# Patient Record
Sex: Female | Born: 1955
Health system: Southern US, Community
[De-identification: ages and names within clinical notes are randomized; demographics above are authoritative.]

## PROBLEM LIST (undated history)

## (undated) DIAGNOSIS — Z8669 Personal history of other diseases of the nervous system and sense organs: Secondary | ICD-10-CM

## (undated) DIAGNOSIS — E785 Hyperlipidemia, unspecified: Secondary | ICD-10-CM

## (undated) DIAGNOSIS — C4491 Basal cell carcinoma of skin, unspecified: Secondary | ICD-10-CM

## (undated) DIAGNOSIS — I10 Essential (primary) hypertension: Secondary | ICD-10-CM

## (undated) DIAGNOSIS — T8859XA Other complications of anesthesia, initial encounter: Secondary | ICD-10-CM

## (undated) HISTORY — DX: Basal cell carcinoma of skin, unspecified: C44.91

## (undated) HISTORY — DX: Essential (primary) hypertension: I10

## (undated) HISTORY — DX: Hyperlipidemia, unspecified: E78.5

## (undated) HISTORY — PX: WISDOM TOOTH EXTRACTION: SHX21

## (undated) HISTORY — PX: BASAL CELL CARCINOMA EXCISION: SHX1214

## (undated) HISTORY — DX: Personal history of other diseases of the nervous system and sense organs: Z86.69

## (undated) HISTORY — PX: OTHER SURGICAL HISTORY: SHX169

---

## 1998-10-18 ENCOUNTER — Other Ambulatory Visit: Admission: RE | Admit: 1998-10-18 | Discharge: 1998-10-18 | Payer: Self-pay | Admitting: *Deleted

## 1999-11-14 ENCOUNTER — Encounter: Payer: Self-pay | Admitting: Internal Medicine

## 1999-11-14 ENCOUNTER — Encounter: Admission: RE | Admit: 1999-11-14 | Discharge: 1999-11-14 | Payer: Self-pay | Admitting: Internal Medicine

## 1999-12-19 ENCOUNTER — Other Ambulatory Visit: Admission: RE | Admit: 1999-12-19 | Discharge: 1999-12-19 | Payer: Self-pay | Admitting: *Deleted

## 2001-01-18 ENCOUNTER — Other Ambulatory Visit: Admission: RE | Admit: 2001-01-18 | Discharge: 2001-01-18 | Payer: Self-pay | Admitting: *Deleted

## 2002-04-14 ENCOUNTER — Other Ambulatory Visit: Admission: RE | Admit: 2002-04-14 | Discharge: 2002-04-14 | Payer: Self-pay | Admitting: Obstetrics & Gynecology

## 2003-06-29 ENCOUNTER — Other Ambulatory Visit: Admission: RE | Admit: 2003-06-29 | Discharge: 2003-06-29 | Payer: Self-pay | Admitting: Obstetrics & Gynecology

## 2005-09-25 ENCOUNTER — Ambulatory Visit: Payer: Self-pay | Admitting: Internal Medicine

## 2006-11-08 ENCOUNTER — Encounter (INDEPENDENT_AMBULATORY_CARE_PROVIDER_SITE_OTHER): Payer: Self-pay | Admitting: Specialist

## 2006-11-08 ENCOUNTER — Ambulatory Visit (HOSPITAL_COMMUNITY): Admission: RE | Admit: 2006-11-08 | Discharge: 2006-11-08 | Payer: Self-pay | Admitting: Obstetrics & Gynecology

## 2007-07-29 ENCOUNTER — Ambulatory Visit: Payer: Self-pay | Admitting: Internal Medicine

## 2007-07-29 DIAGNOSIS — E782 Mixed hyperlipidemia: Secondary | ICD-10-CM | POA: Insufficient documentation

## 2007-08-02 ENCOUNTER — Encounter: Payer: Self-pay | Admitting: Internal Medicine

## 2007-08-03 ENCOUNTER — Encounter (INDEPENDENT_AMBULATORY_CARE_PROVIDER_SITE_OTHER): Payer: Self-pay | Admitting: *Deleted

## 2007-08-03 LAB — CONVERTED CEMR LAB
ALT: 12 units/L (ref 0–35)
AST: 17 units/L (ref 0–37)
Albumin: 3.1 g/dL — ABNORMAL LOW (ref 3.5–5.2)
Alkaline Phosphatase: 78 units/L (ref 39–117)
BUN: 7 mg/dL (ref 6–23)
Basophils Absolute: 0.1 10*3/uL (ref 0.0–0.1)
Basophils Relative: 0.9 % (ref 0.0–1.0)
Bilirubin, Direct: 0.1 mg/dL (ref 0.0–0.3)
CO2: 24 meq/L (ref 19–32)
Calcium: 8.8 mg/dL (ref 8.4–10.5)
Chloride: 106 meq/L (ref 96–112)
Creatinine, Ser: 0.6 mg/dL (ref 0.4–1.2)
Eosinophils Absolute: 0.1 10*3/uL (ref 0.0–0.6)
Eosinophils Relative: 0.8 % (ref 0.0–5.0)
GFR calc Af Amer: 136 mL/min
GFR calc non Af Amer: 112 mL/min
Glucose, Bld: 84 mg/dL (ref 70–99)
HCT: 39.4 % (ref 36.0–46.0)
Hemoglobin: 13.5 g/dL (ref 12.0–15.0)
Lymphocytes Relative: 20.4 % (ref 12.0–46.0)
MCHC: 34.2 g/dL (ref 30.0–36.0)
MCV: 96 fL (ref 78.0–100.0)
Monocytes Absolute: 0.3 10*3/uL (ref 0.2–0.7)
Monocytes Relative: 4.4 % (ref 3.0–11.0)
Neutro Abs: 5.1 10*3/uL (ref 1.4–7.7)
Neutrophils Relative %: 73.5 % (ref 43.0–77.0)
Platelets: 328 10*3/uL (ref 150–400)
Potassium: 3.9 meq/L (ref 3.5–5.1)
RBC: 4.1 M/uL (ref 3.87–5.11)
RDW: 11.7 % (ref 11.5–14.6)
Sodium: 139 meq/L (ref 135–145)
TSH: 1.21 microintl units/mL (ref 0.35–5.50)
Total Bilirubin: 0.4 mg/dL (ref 0.3–1.2)
Total Protein: 6.9 g/dL (ref 6.0–8.3)
WBC: 7 10*3/uL (ref 4.5–10.5)

## 2007-09-02 ENCOUNTER — Encounter (INDEPENDENT_AMBULATORY_CARE_PROVIDER_SITE_OTHER): Payer: Self-pay | Admitting: *Deleted

## 2007-09-02 ENCOUNTER — Ambulatory Visit: Payer: Self-pay | Admitting: Internal Medicine

## 2008-09-08 ENCOUNTER — Ambulatory Visit: Payer: Self-pay | Admitting: Internal Medicine

## 2008-09-08 DIAGNOSIS — Z85828 Personal history of other malignant neoplasm of skin: Secondary | ICD-10-CM | POA: Insufficient documentation

## 2008-09-08 DIAGNOSIS — R519 Headache, unspecified: Secondary | ICD-10-CM | POA: Insufficient documentation

## 2008-09-08 DIAGNOSIS — R5383 Other fatigue: Secondary | ICD-10-CM

## 2008-09-08 DIAGNOSIS — R5381 Other malaise: Secondary | ICD-10-CM | POA: Insufficient documentation

## 2008-09-08 DIAGNOSIS — R51 Headache: Secondary | ICD-10-CM | POA: Insufficient documentation

## 2008-09-08 LAB — CONVERTED CEMR LAB
Cholesterol, target level: 200 mg/dL
HDL goal, serum: 40 mg/dL
LDL Goal: 160 mg/dL

## 2008-09-09 ENCOUNTER — Encounter: Payer: Self-pay | Admitting: Internal Medicine

## 2008-09-13 LAB — CONVERTED CEMR LAB
ALT: 19 units/L (ref 0–35)
AST: 23 units/L (ref 0–37)
Albumin: 3.2 g/dL — ABNORMAL LOW (ref 3.5–5.2)
Alkaline Phosphatase: 81 units/L (ref 39–117)
BUN: 7 mg/dL (ref 6–23)
Basophils Absolute: 0 10*3/uL (ref 0.0–0.1)
Basophils Relative: 0.4 % (ref 0.0–3.0)
Bilirubin, Direct: 0.1 mg/dL (ref 0.0–0.3)
CO2: 26 meq/L (ref 19–32)
Calcium: 8.6 mg/dL (ref 8.4–10.5)
Chloride: 106 meq/L (ref 96–112)
Creatinine, Ser: 0.6 mg/dL (ref 0.4–1.2)
Eosinophils Absolute: 0.1 10*3/uL (ref 0.0–0.7)
Eosinophils Relative: 1.7 % (ref 0.0–5.0)
Folate: 8 ng/mL
Free T4: 0.7 ng/dL (ref 0.6–1.6)
GFR calc Af Amer: 135 mL/min
GFR calc non Af Amer: 112 mL/min
Glucose, Bld: 105 mg/dL — ABNORMAL HIGH (ref 70–99)
HCT: 36.6 % (ref 36.0–46.0)
Hemoglobin: 12.4 g/dL (ref 12.0–15.0)
Lymphocytes Relative: 25.1 % (ref 12.0–46.0)
MCHC: 33.8 g/dL (ref 30.0–36.0)
MCV: 91.3 fL (ref 78.0–100.0)
Monocytes Absolute: 0.4 10*3/uL (ref 0.1–1.0)
Monocytes Relative: 6.3 % (ref 3.0–12.0)
Neutro Abs: 3.8 10*3/uL (ref 1.4–7.7)
Neutrophils Relative %: 66.5 % (ref 43.0–77.0)
Platelets: 321 10*3/uL (ref 150–400)
Potassium: 4 meq/L (ref 3.5–5.1)
RBC: 4.01 M/uL (ref 3.87–5.11)
RDW: 12.3 % (ref 11.5–14.6)
Sodium: 138 meq/L (ref 135–145)
TSH: 1.02 microintl units/mL (ref 0.35–5.50)
Total Bilirubin: 0.5 mg/dL (ref 0.3–1.2)
Total Protein: 7 g/dL (ref 6.0–8.3)
Vitamin B-12: 1441 pg/mL — ABNORMAL HIGH (ref 211–911)
WBC: 5.7 10*3/uL (ref 4.5–10.5)

## 2008-09-16 ENCOUNTER — Encounter (INDEPENDENT_AMBULATORY_CARE_PROVIDER_SITE_OTHER): Payer: Self-pay | Admitting: *Deleted

## 2008-09-30 ENCOUNTER — Ambulatory Visit: Payer: Self-pay | Admitting: Internal Medicine

## 2008-09-30 LAB — CONVERTED CEMR LAB
Cholesterol, target level: 200 mg/dL
HDL goal, serum: 50 mg/dL
LDL Goal: 130 mg/dL

## 2009-01-04 ENCOUNTER — Encounter: Payer: Self-pay | Admitting: Internal Medicine

## 2009-05-14 ENCOUNTER — Ambulatory Visit: Payer: Self-pay | Admitting: Internal Medicine

## 2009-05-18 ENCOUNTER — Encounter (INDEPENDENT_AMBULATORY_CARE_PROVIDER_SITE_OTHER): Payer: Self-pay | Admitting: *Deleted

## 2009-05-18 LAB — CONVERTED CEMR LAB
Cholesterol: 195 mg/dL (ref 0–200)
HDL: 74.1 mg/dL (ref >39.00)
Hgb A1c MFr Bld: 5 % (ref 4.6–6.5)
LDL Cholesterol: 109 mg/dL — ABNORMAL HIGH (ref 0–99)
Total CHOL/HDL Ratio: 3
Triglycerides: 59 mg/dL (ref 0.0–149.0)
VLDL: 11.8 mg/dL (ref 0.0–40.0)

## 2009-05-19 ENCOUNTER — Ambulatory Visit: Payer: Self-pay | Admitting: Internal Medicine

## 2009-05-19 DIAGNOSIS — R635 Abnormal weight gain: Secondary | ICD-10-CM | POA: Insufficient documentation

## 2009-09-17 ENCOUNTER — Encounter: Payer: Self-pay | Admitting: Internal Medicine

## 2010-04-05 ENCOUNTER — Encounter: Payer: Self-pay | Admitting: Internal Medicine

## 2010-04-05 ENCOUNTER — Ambulatory Visit: Payer: Self-pay | Admitting: Internal Medicine

## 2010-04-05 ENCOUNTER — Encounter (INDEPENDENT_AMBULATORY_CARE_PROVIDER_SITE_OTHER): Payer: Self-pay | Admitting: *Deleted

## 2010-04-05 DIAGNOSIS — F3289 Other specified depressive episodes: Secondary | ICD-10-CM | POA: Insufficient documentation

## 2010-04-05 DIAGNOSIS — I10 Essential (primary) hypertension: Secondary | ICD-10-CM | POA: Insufficient documentation

## 2010-04-05 DIAGNOSIS — F329 Major depressive disorder, single episode, unspecified: Secondary | ICD-10-CM | POA: Insufficient documentation

## 2010-04-05 DIAGNOSIS — M2550 Pain in unspecified joint: Secondary | ICD-10-CM | POA: Insufficient documentation

## 2010-04-08 LAB — CONVERTED CEMR LAB
ALT: 15 units/L (ref 0–35)
AST: 20 units/L (ref 0–37)
Albumin: 3.5 g/dL (ref 3.5–5.2)
Alkaline Phosphatase: 99 units/L (ref 39–117)
BUN: 13 mg/dL (ref 6–23)
Basophils Absolute: 0 10*3/uL (ref 0.0–0.1)
Basophils Relative: 0.3 % (ref 0.0–3.0)
Bilirubin, Direct: 0.1 mg/dL (ref 0.0–0.3)
CO2: 29 meq/L (ref 19–32)
Calcium: 9.1 mg/dL (ref 8.4–10.5)
Chloride: 105 meq/L (ref 96–112)
Cholesterol: 215 mg/dL — ABNORMAL HIGH (ref 0–200)
Creatinine, Ser: 0.8 mg/dL (ref 0.4–1.2)
Direct LDL: 124.8 mg/dL
Eosinophils Absolute: 0.2 10*3/uL (ref 0.0–0.7)
Eosinophils Relative: 3.8 % (ref 0.0–5.0)
GFR calc non Af Amer: 82.98 mL/min (ref >60)
Glucose, Bld: 89 mg/dL (ref 70–99)
HCT: 40.7 % (ref 36.0–46.0)
HDL: 81.7 mg/dL (ref >39.00)
Hemoglobin: 13.8 g/dL (ref 12.0–15.0)
Lymphocytes Relative: 31.9 % (ref 12.0–46.0)
Lymphs Abs: 1.6 10*3/uL (ref 0.7–4.0)
MCHC: 33.9 g/dL (ref 30.0–36.0)
MCV: 96.1 fL (ref 78.0–100.0)
Monocytes Absolute: 0.3 10*3/uL (ref 0.1–1.0)
Monocytes Relative: 6.6 % (ref 3.0–12.0)
Neutro Abs: 2.8 10*3/uL (ref 1.4–7.7)
Neutrophils Relative %: 57.4 % (ref 43.0–77.0)
Platelets: 270 10*3/uL (ref 150.0–400.0)
Potassium: 4.5 meq/L (ref 3.5–5.1)
RBC: 4.23 M/uL (ref 3.87–5.11)
RDW: 12.8 % (ref 11.5–14.6)
Rheumatoid fact SerPl-aCnc: <20 intl units/mL (ref 0–20)
Sed Rate: 27 mm/hr — ABNORMAL HIGH (ref 0–22)
Sodium: 142 meq/L (ref 135–145)
TSH: 1.31 microintl units/mL (ref 0.35–5.50)
Total Bilirubin: 0.5 mg/dL (ref 0.3–1.2)
Total CHOL/HDL Ratio: 3
Total Protein: 6.6 g/dL (ref 6.0–8.3)
Triglycerides: 80 mg/dL (ref 0.0–149.0)
VLDL: 16 mg/dL (ref 0.0–40.0)
WBC: 4.9 10*3/uL (ref 4.5–10.5)

## 2010-05-06 ENCOUNTER — Encounter (INDEPENDENT_AMBULATORY_CARE_PROVIDER_SITE_OTHER): Payer: Self-pay

## 2010-05-10 ENCOUNTER — Ambulatory Visit: Payer: Self-pay | Admitting: Internal Medicine

## 2010-05-24 ENCOUNTER — Ambulatory Visit: Payer: Self-pay | Admitting: Internal Medicine

## 2010-05-24 LAB — HM COLONOSCOPY: HM Colonoscopy: NORMAL

## 2010-08-21 HISTORY — PX: COLONOSCOPY: SHX174

## 2010-09-22 NOTE — Assessment & Plan Note (Signed)
Summary: cpx//tl   Vital Signs:  Patient Profile:   55 Years Old Female Weight:      165.50 pounds Pulse rate:   64 / minute Pulse rhythm:   regular BP sitting:   150 / 84  (left arm) Cuff size:   large  Vitals Entered By: Wendall Stade (July 29, 2007 10:11 AM)                 Chief Complaint:  cpx and Rash.  History of Present Illness:  She has areas on both lower legs intermittently, states her sister told her it was vasculitis. Slight edema with rash.  Rash      This is a 55 year old woman who presents with Rash.  At ankle bilat; no triggers or relievers.  The patient reports redness and increased warmth, but denies macules, papules, nodules, hives, welts, pustules, blisters, ulcers, itching, scaling, weeping, oozing, and tenderness.  The rash is located on the right leg and left leg.  The patient denies the following symptoms: fever, headache, facial swelling, tongue swelling, burning, difficulty breathing, abdominal pain, nausea, vomiting, diarrhea, dizziness, sore throat, dysuria, eye symptoms, arthralgias, and vaginal discharge.  The patient denies history of recent tick bite, recent tick exposure, other insect bite, recent infection, recent antibiotic use, new medication, new clothing, new topical exposure, recent travel, pet/animal contact, thyroid disease, chronic liver disease, autoimmune disease, chronic edema, and prior STD.    Current Allergies (reviewed today): No known allergies   Past Medical History:    Reviewed history and no changes required:       aortic bruit no AAA;PMH gallsstones  Past Surgical History:    Reviewed history and no changes required:       two pregnancies and two live births; [facility] 3/08   Family History:    Reviewed history and no changes required:       Father: unknown       Mother: ovarian cancer       brother cva age 68       sister ministrokes, heart disease        bro CABG @ 85        maternal grandmother bladder  ca  Social History:    No diet   Risk Factors:  Tobacco use:  never Alcohol use:  yes    Type:  occa Exercise:  no   Review of Systems  General      Complains of fatigue and sleep disorder.      Denies chills, fever, loss of appetite, malaise, sweats, weakness, and weight loss.      Irregular sleep issues  Eyes      Denies blurring, discharge, double vision, eye irritation, eye pain, halos, itching, light sensitivity, red eye, vision loss-1 eye, and vision loss-both eyes.      Last exam 2008 , ? early glaucoma  ENT      Denies decreased hearing, difficulty swallowing, ear discharge, earache, hoarseness, nasal congestion, nosebleeds, postnasal drainage, ringing in ears, sinus pressure, and sore throat.  CV      Denies bluish discoloration of lips or nails, chest pain or discomfort, difficulty breathing at night, difficulty breathing while lying down, leg cramps with exertion, palpitations, swelling of feet, and swelling of hands.      DOE with stairs  Resp      Denies cough, excessive snoring, hypersomnolence, shortness of breath, and sputum productive.  GI      Denies abdominal pain, bloody  stools, change in bowel habits, constipation, dark tarry stools, indigestion, nausea, and vomiting.  GU      Denies discharge, hematuria, nocturia, and urinary frequency.  MS      Denies joint pain, joint redness, joint swelling, loss of strength, low back pain, mid back pain, muscle aches, muscle , cramps, muscle weakness, stiffness, and thoracic pain.  Derm      See HPI      Complains of changes in color of skin and rash.      Denies changes in nail beds, dryness, excessive perspiration, flushing, hair loss, itching, lesion(s), and poor wound healing.      "white & blotchy" skin  Neuro      Denies difficulty with concentration, disturbances in coordination, memory loss, numbness, poor balance, sensation of room spinning, and tingling.      headaches pre menses  Psych       Complains of depression and easily tearful.      Denies anxiety, easily angered, and irritability.      Periodically "blue" related to menses  Endo      Denies cold intolerance, excessive hunger, excessive thirst, excessive urination, heat intolerance, polyuria, and weight change.  Heme      Denies abnormal bruising and bleeding.  Allergy      Complains of itching eyes and seasonal allergies.   Physical Exam  General:     Well-developed,well-nourished,in no acute distress; alert,appropriate and cooperative throughout examination Head:     Normocephalic and atraumatic without obvious abnormalities. No apparent alopecia or balding. Eyes:     No corneal or conjunctival inflammation noted.Marland Kitchen Perrla. Funduscopic exam benign, without hemorrhages, exudates or papilledema. Ears:     External ear exam shows no significant lesions or deformities.  Otoscopic examination reveals clear canals, tympanic membranes are intact bilaterally without bulging, retraction, inflammation or discharge. Hearing is grossly normal bilaterally. Nose:     External nasal examination shows no deformity or inflammation. Nasal mucosa are pink and moist without lesions or exudates. Mouth:     Oral mucosa and oropharynx without lesions or exudates.  Teeth in good repair. Neck:     No deformities, masses, or tenderness noted. Lungs:     Normal respiratory effort, chest expands symmetrically. Lungs are clear to auscultation, no crackles or wheezes. Heart:     Normal rate and regular rhythm. S1 and S2 normal without gallop, murmur, click, rub. S4 with slurring Abdomen:     Bowel sounds positive,abdomen soft and non-tender without masses, organomegaly or hernias noted. Msk:     No deformity or scoliosis noted of thoracic or lumbar spine.   Pulses:     R and L carotid,radial,dorsalis pedis and posterior tibial pulses are full and equal bilaterally Extremities:     No clubbing, cyanosis, edema, or deformity noted with  normal full range of motion of all joints.   Neurologic:     alert & oriented X3, strength normal in all extremities, gait normal, and DTRs symmetrical and normal.   Skin:     Faint brownish coloration @ ankles which disappear with pressure Cervical Nodes:     No lymphadenopathy noted Axillary Nodes:     No palpable lymphadenopathy Psych:     memory intact for recent and remote, normally interactive, good eye contact, not anxious appearing, and not depressed appearing.      Impression & Recommendations:  Problem # 1:  ROUTINE GENERAL MEDICAL EXAM@HEALTH  CARE FACL (ICD-V70.0)  Orders: TLB-BMP (Basic Metabolic Panel-BMET) (80048-METABOL) TLB-CBC Platelet -  w/Differential (85025-CBCD) TLB-Hepatic/Liver Function Pnl (80076-HEPATIC) TLB-TSH (Thyroid Stimulating Hormone) (84443-TSH) EKG w/ Interpretation (93000)   Problem # 2:  RASH-NONVESICULAR (ICD-782.1)  Orders: EKG w/ Interpretation (93000)   Problem # 3:  HYPERLIPIDEMIA (ICD-272.2)  Orders: T- * Misc. Laboratory test [phone]) EKG w/ Interpretation (93000)   Problem # 4:  ATHEROSCLEROTIC HEART DISEASE, FAMILY HX (ICD-V17.3)  Orders: T- * Misc. Laboratory test [phone]) EKG w/ Interpretation (93000)   Complete Medication List: 1)  Tri-sprintec Bcp  .... Once daily 2)  Xanax Using Husband's    Patient Instructions: 1)  Complete stool cards ; consider screening colonoscopy.    ]

## 2010-09-22 NOTE — Letter (Signed)
Summary: Primary Care Consult Scheduled Letter  [facility] [place]  [address] [place], [place] [address]   Phone: [phone]  Fax: [phone]      04/05/2010 MRN: [id number]  Megan Murphy [address] [place], [place]  [address]    Dear [name],    We have scheduled an appointment for you.  At the recommendation of Dr. Marga Melnick, we have scheduled you for a Screening Colonoscopy with Dr. Lina Sar of [facility].  Your initial Pre-Visit with a Nurse is on 05-10-2010 at 9:00am.  Your Colonoscopy is on 05-24-2010 arrive no later than 10:00am.  Their address is [address], [place] [address]. The office phone number is [phone].  If this appointment day and time is not convenient for you, please feel free to call the office of the doctor you are being referred to at the number listed above and reschedule the appointment.    It is important for you to keep your scheduled appointments. We are here to make sure you are given good patient care.   Thank you,    Renee, Patient Care Coordinator [place] [facility]

## 2010-09-22 NOTE — Letter (Signed)
Summary: Results Follow-up Letter  [facility]  [address] [place], [place] [address]   Phone: [phone]  Fax: [phone]    08/03/2007        Megan Murphy. Megan Murphy [address] [place], [place]  [address]  Dear Ms. Schoff,   The following are the results of your recent test(s):  Test     Result     Pap Smear    Normal_______  Not Normal_____       Comments: _________________________________________________________ Cholesterol LDL(Bad cholesterol):          Your goal is less than:         HDL (Good cholesterol):        Your goal is more than: _________________________________________________________ Other Tests:   _________________________________________________________  Please call for an appointment Or _________________________________________________________ _________________________________________________________ _________________________________________________________  Sincerely,  [facility]

## 2010-09-22 NOTE — Letter (Signed)
Summary: Results Follow up Letter  [facility] [place]  [address] [place], [place] [address]   Phone: [phone]  Fax: [phone]    09/02/2007 MRN: [id number]  Megan Murphy [address] [place], [place]  [address]  Dear Ms. Etcheverry,  The following are the results of your recent test(s):  Test         Result    Pap Smear:        Normal _____  Not Normal _____ Comments: ______________________________________________________ Cholesterol: LDL(Bad cholesterol):         Your goal is less than:         HDL (Good cholesterol):       Your goal is more than: Comments:  ______________________________________________________ Mammogram:        Normal __X___  Not Normal _____ Comments:  ___________________________________________________________________ Hemoccult:        Normal _____  Not normal _______ Comments:    _____________________________________________________________________ Other Tests:    We routinely do not discuss normal results over the telephone.  If you desire a copy of the results, or you have any questions about this information we can discuss them at your next office visit.   Sincerely,

## 2010-09-22 NOTE — Miscellaneous (Signed)
Summary: WAIVER OF LIABILITY  WAIVER OF LIABILITY   Imported By: Freddy Jaksch 08/05/2007 10:49:06  _____________________________________________________________________  External Attachment:    Type:   Image     Comment:   INTER

## 2010-09-22 NOTE — Miscellaneous (Signed)
Summary: Lec previsit  Clinical Lists Changes  Medications: Added new medication of MIRALAX   POWD (POLYETHYLENE GLYCOL 3350) As per prep  instructions. - Signed Added new medication of REGLAN 10 MG  TABS (METOCLOPRAMIDE HCL) As per prep instructions. - Signed Added new medication of DULCOLAX 5 MG  TBEC (BISACODYL) Day before procedure take 2 at 3pm and 2 at 8pm. - Signed Rx of MIRALAX   POWD (POLYETHYLENE GLYCOL 3350) As per prep  instructions.;  #255gm x 0;  Signed;  Entered by: Ulis Rias RN;  Authorized by: Hart Carwin MD;  Method used: Electronically to [facility] # [phone]*, [address] [place], [place], [place]  [address], Ph: [phone], Fax: [phone] Rx of REGLAN 10 MG  TABS (METOCLOPRAMIDE HCL) As per prep instructions.;  #2 x 0;  Signed;  Entered by: Ulis Rias RN;  Authorized by: Hart Carwin MD;  Method used: Electronically to [facility] # [phone]*, [address] [place], [place], [place]  [address], Ph: [phone], Fax: [phone] Rx of DULCOLAX 5 MG  TBEC (BISACODYL) Day before procedure take 2 at 3pm and 2 at 8pm.;  #4 x 0;  Signed;  Entered by: Ulis Rias RN;  Authorized by: Hart Carwin MD;  Method used: Electronically to [facility] # [phone]*, [address] [place], [place], [place]  [address], Ph: [phone], Fax: [phone] Observations: Added new observation of ALLERGY REV: Done (05/10/2010 8:58)    Prescriptions: DULCOLAX 5 MG  TBEC (BISACODYL) Day before procedure take 2 at 3pm and 2 at 8pm.  #4 x 0   Entered by:   Ulis Rias RN   Authorized by:   Hart Carwin MD   Signed by:   Ulis Rias RN on 05/10/2010   Method used:   Electronically to        [facility] # [phone]* (retail)       [address] [place], [place]  [address]       Ph: [phone]       Fax: [phone]   RxID:   [id number] REGLAN 10 MG  TABS (METOCLOPRAMIDE HCL) As per prep instructions.  #2 x 0   Entered by:   Ulis Rias RN   Authorized by:   Hart Carwin  MD   Signed by:   Ulis Rias RN on 05/10/2010   Method used:   Electronically to        [facility] # [phone]* (retail)       [address] [place], [place]  [address]       Ph: [phone]       Fax: [phone]   RxID:   [id number] MIRALAX   POWD (POLYETHYLENE GLYCOL 3350) As per prep  instructions.  #255gm x 0   Entered by:   Ulis Rias RN   Authorized by:   Hart Carwin MD   Signed by:   Ulis Rias RN on 05/10/2010   Method used:   Electronically to        [facility] # [phone]* (retail)       [address] [place], [place]  [address]       Ph: [phone]       Fax: [phone]   RxID:   [id number]

## 2010-09-22 NOTE — Medication Information (Signed)
Summary: Possible Nonadherence with Lisinopril/BCBS  Possible Nonadherence with Lisinopril/BCBS   Imported By: Lanelle Bal 09/24/2009 14:36:02  _____________________________________________________________________  External Attachment:    Type:   Image     Comment:   External Document

## 2010-09-22 NOTE — Procedures (Signed)
Summary: Colonoscopy  Patient: [name] Note: All result statuses are Final unless otherwise noted.  Tests: (1) Colonoscopy (COL)   COL Colonoscopy           DONE     [facility]     [address]     [place], [place]  [address]           COLONOSCOPY PROCEDURE REPORT           PATIENT:  Megan Murphy, Megan Murphy  MR#:  [id number]     BIRTHDATE:  04/25/1956, 54 yrs. old  GENDER:  female     ENDOSCOPIST:  Hedwig Morton. Juanda Chance, MD     REF. BY:  Marga Melnick, M.D.     PROCEDURE DATE:  05/24/2010     PROCEDURE:  Colonoscopy 41324     ASA CLASS:  Class I     INDICATIONS:  Routine Risk Screening     MEDICATIONS:   Versed 8 mg, Fentanyl 75 mcg           DESCRIPTION OF PROCEDURE:   After the risks benefits and     alternatives of the procedure were thoroughly explained, informed     consent was obtained.  Digital rectal exam was performed and     revealed no rectal masses.   The LB [id number] endoscope     was introduced through the anus and advanced to the cecum, which     was identified by both the appendix and ileocecal valve, without     limitations.  The quality of the prep was excellent, using     MiraLax.  The instrument was then slowly withdrawn as the colon     was fully examined.     <<PROCEDUREIMAGES>>           FINDINGS:  No polyps or cancers were seen (see image1 and image2).     Internal hemorrhoids were found (see image3).   Retroflexed views     in the rectum revealed no abnormalities.    The scope was then     withdrawn from the patient and the procedure completed.     COMPLICATIONS:  None     ENDOSCOPIC IMPRESSION:     1) No polyps or cancers     2) Internal hemorrhoids     RECOMMENDATIONS:     1) high fiber diet     REPEAT EXAM:  In 10 year(s) for.           ______________________________     Hedwig Morton. Juanda Chance, MD           CC:           n.     eSIGNED:   Hedwig Morton. Aldon Hengst at 05/24/2010 12:12 PM           Domenick Bookbinder, [id number]  Note: An  exclamation mark (!) indicates a result that was not dispersed into the flowsheet. Document Creation Date: 05/24/2010 12:14 PM _______________________________________________________________________  (1) Order result status: Final Collection or observation date-time: 05/24/2010 12:08 Requested date-time:  Receipt date-time:  Reported date-time:  Referring Physician:   Ordering Physician: Lina Sar [phone]) Specimen Source:  Source: Launa Grill Order Number: [id number] Lab site:   Appended Document: Colonoscopy    Clinical Lists Changes  Observations: Added new observation of COLONNXTDUE: 05/2020 (05/24/2010 15:10)

## 2010-09-22 NOTE — Assessment & Plan Note (Signed)
Summary: BP CONCERNS/SCM   Vital Signs:  Patient Profile:   55 Years Old Female Height:     63.5 inches Weight:      164.2 pounds Temp:     98.1 degrees F oral Pulse rate:   72 / minute Resp:     14 per minute BP sitting:   138 / 92  (left arm) Cuff size:   large  Vitals Entered By: Shonna Chock (September 30, 2008 2:02 PM)                 Chief Complaint:  FOLLOW-UP ON B/P @ HOME TOP NUMBER 150'S AND BOTTOM NUMBER 90'S. VERAPAMIL CAUSES CONSTIPATION.  History of Present Illness: NMRs from 2008 & 2010  reviewed ; major risk = dietary with TG > 150 with pre Diabetes risk. BP @ hom averages 152/95 despite Verapamil  Hypertension History:      She complains of headache and dyspnea with exertion, but denies chest pain, palpitations, orthopnea, PND, peripheral edema, visual symptoms, neurologic problems, syncope, and side effects from treatment.  She notes no problems with any antihypertensive medication side effects.  Further comments include: Excedrin migraines control central headaches.        Positive major cardiovascular risk factors include hyperlipidemia and hypertension.  Negative major cardiovascular risk factors include female age less than 81 years old, no history of diabetes, negative family history for ischemic heart disease, and non-tobacco-user status.        Further assessment for target organ damage reveals no history of ASHD, stroke/TIA, or peripheral vascular disease.    Lipid Management History:      Positive NCEP/ATP III risk factors include hypertension.  Negative NCEP/ATP III risk factors include female age less than 66 years old, no history of early menopause without estrogen hormone replacement, non-diabetic, no family history for ischemic heart disease, non-tobacco-user status, no ASHD (atherosclerotic heart disease), no prior stroke/TIA, no peripheral vascular disease, and no history of aortic aneurysm.        Current Allergies: No known  allergies       Physical Exam  General:     in no acute distress; alert,appropriate and cooperative throughout examination Eyes:     No corneal or conjunctival inflammation noted. EOMI. Perrla.Field of vision grossly normal. Heart:     Normal rate and regular rhythm. S1 and S2 normal without gallop, murmur, click, rub or other extra sounds. Neurologic:     alert & oriented X3, cranial nerves II-XII intact, strength normal in all extremities, and DTRs symmetrical and normal.      Impression & Recommendations:  Problem # 1:  UNSPECIFIED ESSENTIAL HYPERTENSION (ICD-401.9)  Problem # 2:  HEADACHE (ICD-784.0)  Problem # 3:  HYPERLIPIDEMIA (ICD-272.2)  Complete Medication List: 1)  Tri-sprintec Bcp  .... Once daily 2)  Verapamil Hcl Cr 240 Mg Cr-tabs (verapamil Hcl)  .Marland Kitchen.. 1 qd 3)  Gabapentin 100 Mg Caps (Gabapentin) .Marland Kitchen.. 1 q 8 hrs as needed for headache  Hypertension Assessment/Plan:      The patient's hypertensive risk group is category B: At least one risk factor (excluding diabetes) with no target organ damage.  Today's blood pressure is 138/92.    Lipid Assessment/Plan:      Based on NCEP/ATP III, the patient's risk factor category is "0-1 risk factors".  The patient's lipid goals have been set as follows: Total cholesterol goal is 200; LDL cholesterol goal is 130; HDL cholesterol goal is 50; Triglyceride goal is 150.  Her LDL cholesterol  goal has been met.     Patient Instructions: 1)  Complex carb , low glycemic index/load nutrition program ([employer] Sugar [employer]). 2)  Please schedule a follow-up appointment in 4 months. 3)  Lipid Panel,A1c prior to visit, ICD-9:272.4,790.29   Prescriptions: GABAPENTIN 100 MG CAPS (GABAPENTIN) 1 q 8 hrs as needed for headache  #30 x 2   Entered and Authorized by:   Marga Melnick MD   Signed by:   Marga Melnick MD on 09/30/2008   Method used:   Print then Give to Patient   RxID:   [id number] VERAPAMIL HCL CR 240 MG CR-TABS  (VERAPAMIL HCL) 1 qd  #30 x 5   Entered and Authorized by:   Marga Melnick MD   Signed by:   Marga Melnick MD on 09/30/2008   Method used:   Print then Give to Patient   RxID:   [id number]

## 2010-09-22 NOTE — Letter (Signed)
Summary: Results Follow up Letter  [facility] [place]  [address] [place], [place] [address]   Phone: [phone]  Fax: [phone]    09/02/2007 MRN: [id number]  Megan Murphy [address] [place], [place]  [address]  Dear Ms. Calarco,  The following are the results of your recent test(s):  Test         Result    Pap Smear:        Normal _____  Not Normal _____ Comments: ______________________________________________________ Cholesterol: LDL(Bad cholesterol):         Your goal is less than:         HDL (Good cholesterol):       Your goal is more than: Comments:  ______________________________________________________ Mammogram:        Normal _____  Not Normal _____ Comments:  ___________________________________________________________________ Hemoccult:        Normal __X___  Not normal _______ Comments:    _____________________________________________________________________ Other Tests:    We routinely do not discuss normal results over the telephone.  If you desire a copy of the results, or you have any questions about this information we can discuss them at your next office visit.   Sincerely,

## 2010-09-22 NOTE — Assessment & Plan Note (Signed)
Summary: 4 MONTH FOLLOWUP/ALR   Vital Signs:  Patient profile:   55 year old female Weight:      168 pounds BMI:     29.40 Pulse rate:   72 / minute Resp:     15 per minute BP sitting:   132 / 90  (left arm) Cuff size:   large  Vitals Entered By: Shonna Chock (May 19, 2009 10:30 AM) CC: 4 Month Follow-Up Comments REVIEWED MED LIST, PATIENT AGREED DOSE AND INSTRUCTION CORRECT    CC:  4 Month Follow-Up.  History of Present Illness: She has decreased HFCS & hyperglycemic carbs ; TG have decreased from 236 to 59. Weight up 8#; CXVE as stationery bike/ walking 2-3 X /week w/o symptoms except minor DOE. BP @ home 138/84.  Allergies: No Known Drug Allergies  Review of Systems CV:  Complains of shortness of breath with exertion; denies chest pain or discomfort, leg cramps with exertion, and palpitations. MS:  Complains of joint pain; L hip pain in LLDP.  Physical Exam  General:  well-nourished,in no acute distress; alert,appropriate and cooperative throughout examination Neck:  No deformities, masses, or tenderness noted. Lungs:  Normal respiratory effort, chest expands symmetrically. Lungs are clear to auscultation, no crackles or wheezes. Heart:  Normal rate and regular rhythm. S1 and S2 normal without gallop, murmur, click, rub .S4 Pulses:  R and L carotid,radial,dorsalis pedis and posterior tibial pulses are full and equal bilaterally Extremities:  Full ROM, normal strength & DTR LLE Psych:  normally interactive and good eye contact.   Focused & motivated   Impression & Recommendations:  Problem # 1:  UNSPECIFIED ESSENTIAL HYPERTENSION (ICD-401.9)  The following medications were removed from the medication list:    Amlodipine Besylate 5 Mg Tabs (Amlodipine besylate) .Marland Kitchen... 1 once daily Her updated medication list for this problem includes:    Verapamil Hcl Cr 240 Mg Cr-tabs (Verapamil hcl) .Marland Kitchen... 1 by mouth once daily, appointment necessary for additional refills  Lisinopril 10 Mg Tabs (Lisinopril) .Marland Kitchen... 1 once daily (report lip/ tongue swelling0  Problem # 2:  HYPERLIPIDEMIA (ICD-272.2) Dramatic improvement  Problem # 3:  WEIGHT GAIN (ICD-783.1)  Complete Medication List: 1)  Estroven Maximum Strength Tabs (Nutritional supplements) .Marland Kitchen.. 1 by mouth once daily 2)  Verapamil Hcl Cr 240 Mg Cr-tabs (Verapamil hcl) .Marland Kitchen.. 1 by mouth once daily, appointment necessary for additional refills 3)  Gabapentin 100 Mg Caps (Gabapentin) .Marland Kitchen.. 1 q 8 hrs as needed for headache 4)  Lisinopril 10 Mg Tabs (Lisinopril) .Marland Kitchen.. 1 once daily (report lip/ tongue swelling0  Patient Instructions: 1)  Please schedule a follow-up appointment in 6 months. 2)  Check your Blood Pressure regularly. If it is above:135/85 ON AVERAGE  you should make an appointment. 3)  BUN,creat,K+prior to visit, ICD-9:401.9 4)  NMR Lipoprofile Lipid Panel prior to visit, ICD-9:272.4 5)  TSH prior to visit, ICD-9:783.1. Glucosamine 1500 mg once daily X 4-6 weeks for hip Prescriptions: LISINOPRIL 10 MG TABS (LISINOPRIL) 1 once daily (report lip/ tongue swelling0  #90 x 1   Entered and Authorized by:   Marga Melnick MD   Signed by:   Marga Melnick MD on 05/19/2009   Method used:   Faxed to ...       [facility] [phone]* (retail)       [address] [place], [place]  [address]       Ph: [phone]       Fax:  [phone]   RxID:   [id number] AMLODIPINE BESYLATE 5 MG TABS (AMLODIPINE BESYLATE) 1 once daily  #30 x 5   Entered and Authorized by:   Marga Melnick MD   Signed by:   Marga Melnick MD on 05/19/2009   Method used:   Faxed to ...       [facility] [address] [facility] [phone]* (retail)       [address] [place], [place]  [address]       Ph: [phone]       Fax: [phone]   RxID:   [id number] VERAPAMIL HCL CR 240 MG CR-TABS (VERAPAMIL HCL) 1 by mouth once daily, Appointment NECESSARY for additional refills  #90 x 1   Entered and Authorized by:   Marga Melnick  MD   Signed by:   Marga Melnick MD on 05/19/2009   Method used:   Faxed to ...       [facility] [address] [facility] [address]* (retail)       [address] [place], [place]  [address]       Ph: [phone]       Fax: [phone]   RxID:   [id number]

## 2010-09-22 NOTE — Letter (Signed)
Summary: Results Follow-up Letter  [facility]  [address] [place], [place] [address]   Phone: [phone]  Fax: [phone]    08/03/2007        Roger Shelter. Bui [address] [place], [place]  [address]  Dear Ms. Hyams,   The following are the results of your recent test(s):  Test     Result     Pap Smear    Normal_______  Not Normal_____       Comments: _________________________________________________________ Cholesterol LDL(Bad cholesterol):          Your goal is less than:         HDL (Good cholesterol):        Your goal is more than: _________________________________________________________ Other Tests:   _________________________________________________________  Please call for an appointment Or ___Please see attached.______________________________________________________ _________________________________________________________ _________________________________________________________  Sincerely,  [facility]

## 2010-09-22 NOTE — Letter (Signed)
Summary: Pre Visit Letter Revised  [facility]  [address] [place], [place] [address]   Phone: [phone]  Fax: [phone]    04/05/2010 MRN: [id number]  Megan Murphy [address] [place], [place]  [address]              Procedure Date:  05-24-10    Welcome to the Gastroenterology Division at [facility].    You are scheduled to see a nurse for your pre-procedure visit on 05-10-10 at 9am on the 3rd floor at [facility], [address]  We ask that you try to arrive at our office 15 minutes prior to your appointment time to allow for check-in.  Please take a minute to review the attached form.  If you answer "Yes" to one or more of the questions on the first page, we ask that you call the person listed at your earliest opportunity.  If you answer "No" to all of the questions, please complete the rest of the form and bring it to your appointment.    Your nurse visit will consist of discussing your medical and surgical history, your immediate family medical history, and your medications.    If you are unable to list all of your medications on the form, please bring the medication bottles to your appointment and we will list them.  We will need to be aware of both prescribed and over the counter drugs.  We will need to know exact dosage information as well.    Please be prepared to read and sign documents such as consent forms, a financial agreement, and acknowledgement forms.  If necessary, and with your consent, a friend or relative is welcome to sit-in on the nurse visit with you.  Please bring your insurance card so that we may make a copy of it.  If your insurance requires a referral to see a specialist, please bring your referral form from your primary care physician.  No co-pay is required for this nurse visit.     If you cannot keep your appointment, please call [phone] to cancel or reschedule prior to your appointment date.  This allows [place]  the opportunity to schedule an appointment for another patient in need of care.   Thank you for choosing [facility] for your medical needs.  We appreciate the opportunity to care for you.  Please visit [place] at our website  to learn more about our practice.                     Sincerely,            The Gastroenterology Division

## 2010-09-22 NOTE — Assessment & Plan Note (Signed)
Summary: cpx   Vital Signs:  Patient Profile:   55 Years Old Female Height:     63.5 inches Weight:      162.8 pounds Temp:     98.4 degrees F oral Pulse rate:   80 / minute Resp:     16 per minute BP sitting:   142 / 90  (left arm) Cuff size:   large  Vitals Entered By: Shonna Chock (September 08, 2008 9:41 AM)             Comments PATIENT REFUSED TDAP TODAY.Shonna Chock  September 08, 2008 9:43 AM      Chief Complaint:  CPX WITH FASTING LABS.  History of Present Illness: "I want to cry 60% of time; dazed & confused ;occa L temporal headaches; & worn out by 4 pm". On BCP X 2 years to regulate menses; menses still regular. She questions whether BCP causing HTN. BP @ drug stores ; in 140s/90s for 6+ months. Repeat BP was 138/92.  Hypertension History:      She complains of headache, but denies chest pain, palpitations, dyspnea with exertion, orthopnea, PND, peripheral edema, visual symptoms, neurologic problems, syncope, and side effects from treatment.  She notes no problems with any antihypertensive medication side effects.        Positive major cardiovascular risk factors include hyperlipidemia and hypertension.  Negative major cardiovascular risk factors include female age less than 4 years old, no history of diabetes, negative family history for ischemic heart disease, and non-tobacco-user status.        Further assessment for target organ damage reveals no history of ASHD, stroke/TIA, or peripheral vascular disease.    Lipid Management History:      Positive NCEP/ATP III risk factors include hypertension.  Negative NCEP/ATP III risk factors include female age less than 55 years old, no history of early menopause without estrogen hormone replacement, non-diabetic, no family history for ischemic heart disease, non-tobacco-user status, no ASHD (atherosclerotic heart disease), no prior stroke/TIA, no peripheral vascular disease, and no history of aortic aneurysm.        Current  Allergies (reviewed today): No known allergies   Past Medical History:    aortic bruit no AAA;PMH gallstones    Headache, PMH of migraines    Hyperlipidemia    Skin cancer, hx of, Basal cell ,Dr Danella Deis  Past Surgical History:    two pregnancies and two live births; [facility] 3/08; Oral Surgery age 80 (wisdom teeth)   Family History:    Father: unknown medical history    Mother: ovarian cancer    brotherCVA age 67    sister ministrokes, heart disease     bro CABG @ 48, colitis ; sister colon polyps    maternal grandmother bladder CA  Social History:    No diet    Occupation:Supervisor Customer Service    Never Smoked    Alcohol use-yes    Regular exercise-no   Risk Factors:  Tobacco use:  never Alcohol use:  yes    Type:  occa/ socially Exercise:  no  Family History Risk Factors:    Family History of MI in females < 64 years old:  no    Family History of MI in males < 40 years old:  no   Review of Systems       The patient complains of abdominal pain.  The patient denies anorexia, fever, weight loss, vision loss, decreased hearing, hoarseness, prolonged cough, hemoptysis, melena, hematochezia, severe indigestion/heartburn, hematuria, incontinence,  muscle weakness, suspicious skin lesions, unusual weight change, abnormal bleeding, enlarged lymph nodes, and angioedema.         weight up 3 # over holidays. Occa L abd discomfort; no Rx.Rare rectal bleeding; last 4 months ago.  Eyes      Denies double vision and vision loss-both eyes.      Minimal blurring  Neuro      Complains of headaches.      Denies disturbances in coordination, numbness, poor balance, and tingling.      Throbbing headaches, last until Metcalf Powder taken  Psych      Complains of anxiety.      Denies depression, easily angered, easily tearful, and irritability.   Physical Exam  General:     well-nourished,in no acute distress; alert,appropriate and cooperative throughout examination Head:      Normocephalic and atraumatic without obvious abnormalities.  Eyes:     No corneal or conjunctival inflammation noted.Perrla. Funduscopic exam benign, without hemorrhages, exudates or papilledema.  Ears:     External ear exam shows no significant lesions or deformities.  Otoscopic examination reveals clear canals, tympanic membranes are intact bilaterally without bulging, retraction, inflammation or discharge. Hearing is grossly normal bilaterally. Nose:     External nasal examination shows no deformity or inflammation. Nasal mucosa are pink and moist without lesions or exudates. Mouth:     Oral mucosa and oropharynx without lesions or exudates.  Teeth in good repair. Neck:     No deformities, masses, or tenderness noted. Breasts:     Dr Seymour Bars Lungs:     Normal respiratory effort, chest expands symmetrically. Lungs are clear to auscultation, no crackles or wheezes. Heart:     Normal rate and regular rhythm. S1 and S2 normal without gallop, murmur, click, rub. S4 with slurring Abdomen:     Bowel sounds positive,abdomen soft and non-tender without masses, organomegaly or hernias noted. No bruit heard Rectal:     Screening Colonoscopy Genitalia:     Dr Seymour Bars Msk:     No deformity or scoliosis noted of thoracic or lumbar spine.   Pulses:     R and L carotid,radial,dorsalis pedis and posterior tibial pulses are full and equal bilaterally Extremities:     No clubbing, cyanosis, edema, or deformity noted with normal full range of motion of all joints.   Neurologic:     alert & oriented X3 and DTRs symmetrical and normal.   Skin:     Intact without suspicious lesions or rashes Myriad freckles Cervical Nodes:     No lymphadenopathy noted Axillary Nodes:     No palpable lymphadenopathy Psych:     memory intact for recent and remote, normally interactive, good eye contact, not anxious appearing, and not depressed appearing.      Impression & Recommendations:  Problem # 1:  ROUTINE  GENERAL MEDICAL EXAM@HEALTH  CARE FACL (ICD-V70.0)  Orders: EKG w/ Interpretation (93000) Venipuncture ([address]454) TLB-BMP (Basic Metabolic Panel-BMET) (80048-METABOL) TLB-CBC Platelet - w/Differential (85025-CBCD) TLB-Hepatic/Liver Function Pnl (80076-HEPATIC) TLB-TSH (Thyroid Stimulating Hormone) (84443-TSH) TLB-B12 + Folate Pnl (82746_82607-B12/FOL) TLB-T4 (Thyrox), Free [phone]) T- * Misc. Laboratory test [phone]) Gastroenterology Referral (GI)   Problem # 2:  ELEVATED BLOOD PRESSURE WITHOUT DIAGNOSIS OF HYPERTENSION (ICD-796.2)  Orders: EKG w/ Interpretation (93000) Venipuncture (21308)  Her updated medication list for this problem includes:    Verapamil Hcl Cr 120 Mg Cr-tabs (Verapamil hcl) .Marland Kitchen... 1 qd   Problem # 3:  HEADACHE (ICD-784.0)  Orders: Venipuncture ([address])   Problem #  4:  FATIGUE (ICD-780.79)  Orders: Venipuncture ([address]) TLB-TSH (Thyroid Stimulating Hormone) (84443-TSH) TLB-B12 + Folate Pnl (60454_09811-B14/NWG) TLB-T4 (Thyrox), Free ([phone])   Problem # 5:  HYPERLIPIDEMIA (ICD-272.2)  Orders: Venipuncture ([address]) T- * Misc. Laboratory test [phone])   Complete Medication List: 1)  Tri-sprintec Bcp  .... Once daily 2)  Verapamil Hcl Cr 120 Mg Cr-tabs (Verapamil hcl) .Marland Kitchen.. 1 qd  Hypertension Assessment/Plan:      The patient's hypertensive risk group is category B: At least one risk factor (excluding diabetes) with no target organ damage.  Today's blood pressure is 142/90.    Lipid Assessment/Plan:      Based on NCEP/ATP III, the patient's risk factor category is "0-1 risk factors".  From this information, the patient's calculated lipid goals are as follows: Total cholesterol goal is 200; LDL cholesterol goal is 160; HDL cholesterol goal is 40; Triglyceride goal is 150.     Patient Instructions: 1)  Keep Headache Diary. 2)  Check your Blood Pressure regularly. If it is above: 130/85 or less ON AVERAGE you should make an appointment. If BP  not controlled ; change in BCP may need to be considered as per Dr Seymour Bars.   Prescriptions: VERAPAMIL HCL CR 120 MG CR-TABS (VERAPAMIL HCL) 1 qd  #30 x 5   Entered and Authorized by:   Marga Melnick MD   Signed by:   Marga Melnick MD on 09/08/2008   Method used:   Print then Give to Patient   RxID:   [id number]     Appended Document: cpx  Laboratory Results   Urine Tests   Date/Time Reported: September 08, 2008 1:35 PM   Routine Urinalysis   Color: straw Appearance: Hazy Glucose: negative   (Normal Range: Negative) Bilirubin: negative   (Normal Range: Negative) Ketone: negative   (Normal Range: Negative) Spec. Gravity: <1.005   (Normal Range: 1.003-1.035) Blood: small   (Normal Range: Negative) pH: 5.0   (Normal Range: 5.0-8.0) Protein: negative   (Normal Range: Negative) Urobilinogen: negative   (Normal Range: 0-1) Nitrite: negative   (Normal Range: Negative) Leukocyte Esterace: negative   (Normal Range: Negative)    Comments: Floydene Flock CMA  September 08, 2008 1:36 PM cx sent

## 2010-09-22 NOTE — Letter (Signed)
Summary: Results Follow up Letter  [facility]  [address] [place], [place] [address]   Phone: [phone]  Fax: [phone]    05/18/2009 MRN: [id number]  [facility] [address] [place], [place]  [address]      Dear [name],  The following are the results of your recent test(s):  Test         Result    Pap Smear:        Normal _____  Not Normal _____ Comments: ______________________________________________________ Cholesterol: LDL(Bad cholesterol):         Your goal is less than:         HDL (Good cholesterol):       Your goal is more than: Comments:  ______________________________________________________ Mammogram:        Normal _____  Not Normal _____ Comments:  ___________________________________________________________________ Hemoccult:        Normal _____  Not normal _______ Comments:    _____________________________________________________________________ Other Tests:Please see attached labs.    We routinely do not discuss normal results over the telephone.  If you desire a copy of the results, or you have any questions about this information we can discuss them at your next office visit.   Sincerely,     Felecia CMA

## 2010-09-22 NOTE — Assessment & Plan Note (Signed)
Summary: CPX/KDC   Vital Signs:  Patient profile:   55 year old female Height:      63.5 inches (161.29 cm) Weight:      164 pounds (74.55 kg) BMI:     28.70 Temp:     98.3 degrees F (36.83 degrees C) oral Pulse rate:   64 / minute Resp:     15 per minute BP sitting:   132 / 90  (left arm) Cuff size:   large  Vitals Entered By: Brenton Grills MA (April 05, 2010 8:26 AM)  Comments Pt states she is no longer taking Gabapentin, Lisinopril, or Estroven   History of Present Illness: [name] is here for a physical; she has some arthralgias. Hypertension Follow-Up      This is a 55 year old woman who  also presents for Hypertension follow-up.  The patient reports  occasional lightheadedness,  daily urinary frequency, monthly  headaches, and fatigue, but denies rash.  Associated symptoms include exercise intolerance due to fatigue, dyspnea, and palpitations.  The patient denies the following associated symptoms: chest pain, chest pressure, syncope, leg edema, and pedal edema.  Compliance with medications (by patient report) has been near 100%. She stopped ACE-I due to cough.  BP on CCB alone 135/88 on average.Adjunctive measures currently used by the patient include salt restriction.    Lipid Management History:      Positive NCEP/ATP III risk factors include hypertension.  Negative NCEP/ATP III risk factors include female age less than 32 years old, no history of early menopause without estrogen hormone replacement, non-diabetic, HDL cholesterol greater than 60, no family history for ischemic heart disease, non-tobacco-user status, no ASHD (atherosclerotic heart disease), no prior stroke/TIA, no peripheral vascular disease, and no history of aortic aneurysm.     Preventive Screening-Counseling & Management  Caffeine-Diet-Exercise     Does Patient Exercise: yes  Current Medications (verified): 1)  Estroven Maximum Strength  Tabs (Nutritional Supplements) .Marland Kitchen.. 1 By Mouth Once  Daily 2)  Verapamil Hcl Cr 240 Mg Cr-Tabs (Verapamil Hcl) .Marland Kitchen.. 1 By Mouth Once Daily, Appointment Necessary For Additional Refills 3)  Gabapentin 100 Mg Caps (Gabapentin) .Marland Kitchen.. 1 Q 8 Hrs As Needed For Headache 4)  Lisinopril 10 Mg Tabs (Lisinopril) .Marland Kitchen.. 1 Once Daily (Report Lip/ Tongue Swelling0  Allergies (verified): 1)  ! Lisinopril (Lisinopril)  Past History:  Past Medical History: aortic bruit no AAA;PMH gallstones Headache, PMH of migraines Hyperlipidemia: Framingham Study LDL goal = < 160.NMR Lipoprofile 2008: LDL 131(1444/250), HDL 79, TG 185. LDL goal = < 130. Skin cancer,PMH  of, Basal Cell ,Dr  Campbell Stall  Past Surgical History: G2 P2; D&C 2008; Oral Surgery : Wisdom Teeth Extraction age 66; Note : No colonoscopy due to work conflict when scheduled. SOC reviewed  Family History: Father: unknown medical history Mother: ovarian cancer brother:CVA @  age 79 sister: ministrokes, heart disease  bro: CABG @ 69, colitis ; sister: colon polyps maternal grandmother: bladder cancer  Social History: No diet Occupation:Supervisor Customer Service Never Smoked Alcohol use-yes: occasionally Regular exercise-yes: 1-2 X/ week on Elliptical Married Does Patient Exercise:  yes  Review of Systems  The patient denies anorexia, fever, weight loss, weight gain, vision loss, decreased hearing, hoarseness, prolonged cough, hemoptysis, abdominal pain, melena, hematochezia, severe indigestion/heartburn, hematuria, incontinence, suspicious skin lesions, unusual weight change, abnormal bleeding, enlarged lymph nodes, and angioedema.   MS:  Complains of joint pain; denies joint redness, joint swelling, low back pain, mid back pain, and thoracic pain;  Pain mainly in  knuckles with am stiffness, better through day. Rx: none. Psych:  Complains of anxiety, depression, and irritability; denies easily angered and easily tearful.  Physical Exam  General:  well-nourished; alert,appropriate and  cooperative throughout examination Head:  Normocephalic and atraumatic without obvious abnormalities. Eyes:  No corneal or conjunctival inflammation noted. Perrla. Funduscopic exam benign, without hemorrhages, exudates or papilledema.  Ears:  External ear exam shows no significant lesions or deformities.  Otoscopic examination reveals clear canals, tympanic membranes are intact bilaterally without bulging, retraction, inflammation or discharge. Hearing is grossly normal bilaterally. Nose:  External nasal examination shows no deformity or inflammation. Nasal mucosa are pink and moist without lesions or exudates. Mouth:  Oral mucosa and oropharynx without lesions or exudates.  Teeth in good repair. Neck:  No deformities, masses, or tenderness noted. Lungs:  Normal respiratory effort, chest expands symmetrically. Lungs are clear to auscultation, no crackles or wheezes. Heart:  Normal rate and regular rhythm. S1 and S2 normal without gallop, murmur, click, rub .S4 with slurring Abdomen:  Bowel sounds positive,abdomen soft and non-tender without masses, organomegaly or hernias noted. Genitalia:  Dr Seymour Bars Msk:  No deformity or scoliosis noted of thoracic or lumbar spine.   Pulses:  R and L carotid,radial,dorsalis pedis and posterior tibial pulses are full and equal bilaterally Extremities:  No clubbing, cyanosis, edema, or deformity noted with normal full range of motion of all joints.   No significant hand changes Neurologic:  alert & oriented X3 and DTRs symmetrical and normal.   Skin:  Intact without suspicious lesions or rashes.Myriad freckling Cervical Nodes:  No lymphadenopathy noted Axillary Nodes:  No palpable lymphadenopathy Psych:  memory intact for recent and remote, flat affect, and subdued.     Impression & Recommendations:  Problem # 1:  ROUTINE GENERAL MEDICAL EXAM@HEALTH  CARE FACL (ICD-V70.0)  Orders: EKG w/ Interpretation (93000) Venipuncture (03474) TLB-Lipid Panel  (80061-LIPID) TLB-BMP (Basic Metabolic Panel-BMET) (80048-METABOL) TLB-CBC Platelet - w/Differential (85025-CBCD) TLB-Hepatic/Liver Function Pnl (80076-HEPATIC) TLB-TSH (Thyroid Stimulating Hormone) (84443-TSH) TLB-Sedimentation Rate (ESR) (85652-ESR) TLB-Rheumatoid Factor (RA) (25956-LO)  Problem # 2:  ARTHRALGIA (ICD-719.40)  Orders: Venipuncture (75643) TLB-Sedimentation Rate (ESR) (85652-ESR) TLB-Rheumatoid Factor (RA) (32951-OA)  Problem # 3:  DEPRESSION (ICD-311)  Her updated medication list for this problem includes:    Citalopram Hydrobromide 20 Mg Tabs (Citalopram hydrobromide) .Marland Kitchen... 1 once daily  Problem # 4:  HYPERTENSION (ICD-401.9)  The following medications were removed from the medication list:    Lisinopril 10 Mg Tabs (Lisinopril) .Marland Kitchen... 1 once daily (report lip/ tongue swelling0 Her updated medication list for this problem includes:    Verapamil Hcl Cr 240 Mg Cr-tabs (Verapamil hcl) .Marland Kitchen... 1 by mouth once daily    Losartan Potassium 50 Mg Tabs (Losartan potassium) .Marland Kitchen... 1 once daily if bp averages > 135/85  Problem # 5:  HYPERLIPIDEMIA (ICD-272.2)  Complete Medication List: 1)  Estroven Maximum Strength Tabs (Nutritional supplements) .Marland Kitchen.. 1 by mouth once daily 2)  Verapamil Hcl Cr 240 Mg Cr-tabs (Verapamil hcl) .Marland Kitchen.. 1 by mouth once daily 3)  Gabapentin 100 Mg Caps (Gabapentin) .Marland Kitchen.. 1 q 8 hrs as needed for headache 4)  Losartan Potassium 50 Mg Tabs (Losartan potassium) .Marland Kitchen.. 1 once daily if bp averages > 135/85 5)  Citalopram Hydrobromide 20 Mg Tabs (Citalopram hydrobromide) .Marland Kitchen.. 1 once daily  Other Orders: Gastroenterology Referral (GI)  Lipid Assessment/Plan:      Based on NCEP/ATP III, the patient's risk factor category is "0-1 risk factors".  The patient's lipid goals  are as follows: Total cholesterol goal is 200; LDL cholesterol goal is 130; HDL cholesterol goal is 50; Triglyceride goal is 150.  Her LDL cholesterol goal has been met.    Patient  Instructions: 1)  Check your Blood Pressure regularly. If it is above:135/85 ON AVERAGE add Losartan 50 mg  daily  you should make an appointment. 2)  Take an  81 mg coated Aspirin every day. 3)  Please schedule a follow-up appointment in 6 weeks. Prescriptions: CITALOPRAM HYDROBROMIDE 20 MG TABS (CITALOPRAM HYDROBROMIDE) 1 once daily  #30 x 5   Entered and Authorized by:   Marga Melnick MD   Signed by:   Marga Melnick MD on 04/05/2010   Method used:   Print then Give to Patient   RxID:   [id number] LOSARTAN POTASSIUM 50 MG TABS (LOSARTAN POTASSIUM) 1 once daily IF BP AVERAGES > 135/85  #30 x 5   Entered and Authorized by:   Marga Melnick MD   Signed by:   Marga Melnick MD on 04/05/2010   Method used:   Print then Give to Patient   RxID:   [id number] VERAPAMIL HCL CR 240 MG CR-TABS (VERAPAMIL HCL) 1 by mouth once daily  #90 x 3   Entered and Authorized by:   Marga Melnick MD   Signed by:   Marga Melnick MD on 04/05/2010   Method used:   Faxed to ...       [facility] [address] (retail)       [address] [place], [place]  [address]       Ph: [phone]       Fax: [phone]   RxID:   [id number]   Appended Document: CPX/KDC

## 2010-09-22 NOTE — Letter (Signed)
Summary: [facility] Instructions  [facility]  [address] [place], [place] [address]   Phone: [phone]  Fax: [phone]       Megan Murphy    [birth year]    MRN: [id number]       Procedure Day /Date: Tuesday 05-24-10     Arrival Time:  10:00 a.m.     Procedure Time: 11:00 a.m.     Location of Procedure:                    _x _  [facility] (4th Floor)    PREPARATION FOR COLONOSCOPY WITH MIRALAX  Starting 5 days prior to your procedure  05-19-10 do not eat nuts, seeds, popcorn, corn, beans, peas,  salads, or any raw vegetables.  Do not take any fiber supplements (e.g. Metamucil, Citrucel, and Benefiber). ____________________________________________________________________________________________________   THE DAY BEFORE YOUR PROCEDURE         DATE:  05-23-10 DAY:  Monday  1   Drink clear liquids the entire day-NO SOLID FOOD  2   Do not drink anything colored red or purple.  Avoid juices with pulp.  No orange juice.  3   Drink at least 64 oz. (8 glasses) of fluid/clear liquids during the day to prevent dehydration and help the prep work efficiently.  CLEAR LIQUIDS INCLUDE: Water Jello Ice Popsicles Tea (sugar ok, no milk/cream) Powdered fruit flavored drinks Coffee (sugar ok, no milk/cream) Gatorade Juice: apple, white grape, white cranberry  Lemonade Clear bullion, consomm, broth Carbonated beverages (any kind) Strained chicken noodle soup Hard Candy  4   Mix the entire bottle of Miralax with 64 oz. of Gatorade/Powerade in the morning and put in the refrigerator to chill.  5   At 3:00 pm take 2 Dulcolax/Bisacodyl tablets.  6   At 4:30 pm take one Reglan/Metoclopramide tablet.  7  Starting at 5:00 pm drink one 8 oz glass of the Miralax mixture every 15-20 minutes until you have finished drinking the entire 64 oz.  You should finish drinking prep around 7:30 or 8:00 pm.  8   If you are nauseated, you may take the 2nd  Reglan/Metoclopramide tablet at 6:30 pm.        9    At 8:00 pm take 2 more DULCOLAX/Bisacodyl tablets.     THE DAY OF YOUR PROCEDURE      DATE:  05-24-10  DAY: Tuesday  You may drink clear liquids until  9:00 a.m.  (2 HOURS BEFORE PROCEDURE).   MEDICATION INSTRUCTIONS  Unless otherwise instructed, you should take regular prescription medications with a small sip of water as early as possible the morning of your procedure.         OTHER INSTRUCTIONS  You will need a responsible adult at least 55 years of age to accompany you and drive you home.   This person must remain in the waiting room during your procedure.  Wear loose fitting clothing that is easily removed.  Leave jewelry and other valuables at home.  However, you may wish to bring a book to read or an iPod/MP3 player to listen to music as you wait for your procedure to start.  Remove all body piercing jewelry and leave at home.  Total time from sign-in until discharge is approximately 2-3 hours.  You should go home directly after your procedure and rest.  You can resume normal activities the day after your procedure.  The day of your procedure you should not:  Drive   Make legal decisions   Operate machinery   Drink alcohol   Return to work  You will receive specific instructions about eating, activities and medications before you leave.   The above instructions have been reviewed and explained to me by   Ulis Rias RN  May 10, 2010 9:19 AM     I fully understand and can verbalize these instructions _____________________________ Date _______

## 2010-09-22 NOTE — Letter (Signed)
Summary: Results Follow up Letter  [facility]  [address] [place], [place] [address]   Phone: [phone]  Fax: [phone]    09/16/2008 MRN: [id number]  [facility] [address] [place], [place]  [address]  Dear Ms. Scharrer,  The following are the results of your recent test(s):  Test         Result    Pap Smear:        Normal _____  Not Normal _____ Comments: ______________________________________________________ Cholesterol: LDL(Bad cholesterol):         Your goal is less than:         HDL (Good cholesterol):       Your goal is more than: Comments:  ______________________________________________________ Mammogram:        Normal _____  Not Normal _____ Comments:  ___________________________________________________________________ Hemoccult:        Normal _____  Not normal _______ Comments:    _____________________________________________________________________ Other Tests: PLEASE SEE ATTACHED LABS DONE ON 09/08/2008    We routinely do not discuss normal results over the telephone.  If you desire a copy of the results, or you have any questions about this information we can discuss them at your next office visit.   Sincerely,

## 2011-01-06 NOTE — Op Note (Signed)
NAME:  Megan Murphy, Megan Murphy             ACCOUNT NO.:  [id number]   MEDICAL RECORD NO.:  [id number]          PATIENT TYPE:  AMB   LOCATION:  SDC                           FACILITY:  [facility]   PHYSICIAN:  Genia Del, M.D.DATE OF BIRTH:  08/28/1955   DATE OF PROCEDURE:  11/08/2006  DATE OF DISCHARGE:                               OPERATIVE REPORT   PREOPERATIVE DIAGNOSIS:  Menorrhagia with anemia and submucosal myoma.   POSTOPERATIVE DIAGNOSIS:  Menorrhagia with anemia and submucosal myoma.   PROCEDURE:  Hysteroscopy with resection and dilatation and curettage.   SURGEON:  Dr. Genia Del   ASSISTANT:  None.   PROCEDURE:  Under general analgesia with endotracheal intubation, the  patient is in the lithotomy position.  She is prepped with Betadine on  the suprapubic, vulvar, and vaginal areas.  The bladder is catheterized,  and the patient is draped as usual.  The vaginal exam reveals a  retroverted uterus, small bowel, no adnexal mass.  The speculum is  introduced into the vagina.  The anterior lip of the cervix is grasped  with a tenaculum.  A paracervical block is done with Nesacaine 1%, 20 mL  at 4 and 8 o'clock.  We then dilate the cervix with Hagar dilators up to  #31 without difficulty.  We then introduce the operative hysteroscope in  the intrauterine cavity.  Visualization of the entire uterine cavity,  the ostia are well seen, and pictures are taken.  A fundal anterior  myoma is present slightly to the right.  It is about 1-2 cm in diameter.  We use the loop to resect the myoma.  The specimen is sent to pathology.  We then use cauterization to assure good hemostasis in the intrauterine  cavity.  We proceed with curettage of the entire intrauterine cavity  with a sharp curette.  The curettings are sent to pathology separately.  We then go back in the intrauterine cavity, visualize the site where the  myomectomy was performed.  Hemostasis is adequate.  Pictures are  taken.  The instruments are removed.  The estimated blood loss was 25 mL.  The  fluid deficit was 600 mL.  No complication occurred, and the patient was  brought to the recovery room in good stable status.      Genia Del, M.D.  Electronically Signed     ML/MEDQ  D:  11/08/2006  T:  11/08/2006  Job:  [id number]

## 2011-03-04 ENCOUNTER — Encounter: Payer: Self-pay | Admitting: Internal Medicine

## 2011-03-07 ENCOUNTER — Ambulatory Visit (INDEPENDENT_AMBULATORY_CARE_PROVIDER_SITE_OTHER): Payer: Federal, State, Local not specified - PPO | Admitting: Internal Medicine

## 2011-03-07 ENCOUNTER — Encounter: Payer: Self-pay | Admitting: Internal Medicine

## 2011-03-07 DIAGNOSIS — E782 Mixed hyperlipidemia: Secondary | ICD-10-CM

## 2011-03-07 DIAGNOSIS — M2550 Pain in unspecified joint: Secondary | ICD-10-CM

## 2011-03-07 DIAGNOSIS — I10 Essential (primary) hypertension: Secondary | ICD-10-CM

## 2011-03-07 DIAGNOSIS — Z0000 Encounter for general adult medical examination without abnormal findings: Secondary | ICD-10-CM

## 2011-03-07 DIAGNOSIS — Z85828 Personal history of other malignant neoplasm of skin: Secondary | ICD-10-CM

## 2011-03-07 LAB — TSH: TSH: 1.45 u[IU]/mL (ref 0.35–5.50)

## 2011-03-07 LAB — LIPID PANEL
Cholesterol: 209 mg/dL — ABNORMAL HIGH (ref 0–200)
HDL: 80.5 mg/dL (ref >39.00)
Total CHOL/HDL Ratio: 3
Triglycerides: 75 mg/dL (ref 0.0–149.0)
VLDL: 15 mg/dL (ref 0.0–40.0)

## 2011-03-07 LAB — BASIC METABOLIC PANEL
BUN: 17 mg/dL (ref 6–23)
CO2: 28 mEq/L (ref 19–32)
Calcium: 9 mg/dL (ref 8.4–10.5)
Chloride: 106 mEq/L (ref 96–112)
Creatinine, Ser: 0.7 mg/dL (ref 0.4–1.2)
GFR: 95.45 mL/min (ref >60.00)
Glucose, Bld: 101 mg/dL — ABNORMAL HIGH (ref 70–99)
Potassium: 3.9 mEq/L (ref 3.5–5.1)
Sodium: 141 mEq/L (ref 135–145)

## 2011-03-07 LAB — CBC WITH DIFFERENTIAL/PLATELET
Basophils Absolute: 0 10*3/uL (ref 0.0–0.1)
Basophils Relative: 0.6 % (ref 0.0–3.0)
Eosinophils Absolute: 1.1 10*3/uL — ABNORMAL HIGH (ref 0.0–0.7)
Eosinophils Relative: 17.6 % — ABNORMAL HIGH (ref 0.0–5.0)
HCT: 41.9 % (ref 36.0–46.0)
Hemoglobin: 14.2 g/dL (ref 12.0–15.0)
Lymphocytes Relative: 32 % (ref 12.0–46.0)
Lymphs Abs: 2 10*3/uL (ref 0.7–4.0)
MCHC: 34 g/dL (ref 30.0–36.0)
MCV: 95.7 fl (ref 78.0–100.0)
Monocytes Absolute: 0.3 10*3/uL (ref 0.1–1.0)
Monocytes Relative: 5.5 % (ref 3.0–12.0)
Neutro Abs: 2.7 10*3/uL (ref 1.4–7.7)
Neutrophils Relative %: 44.3 % (ref 43.0–77.0)
Platelets: 247 10*3/uL (ref 150.0–400.0)
RBC: 4.38 Mil/uL (ref 3.87–5.11)
RDW: 12 % (ref 11.5–14.6)
WBC: 6.2 10*3/uL (ref 4.5–10.5)

## 2011-03-07 LAB — HEPATIC FUNCTION PANEL
ALT: 15 U/L (ref 0–35)
AST: 16 U/L (ref 0–37)
Albumin: 4.2 g/dL (ref 3.5–5.2)
Alkaline Phosphatase: 114 U/L (ref 39–117)
Bilirubin, Direct: 0.1 mg/dL (ref 0.0–0.3)
Total Bilirubin: 0.5 mg/dL (ref 0.3–1.2)
Total Protein: 7.3 g/dL (ref 6.0–8.3)

## 2011-03-07 LAB — LDL CHOLESTEROL, DIRECT: Direct LDL: 122 mg/dL

## 2011-03-07 LAB — SEDIMENTATION RATE: Sed Rate: 42 mm/hr — ABNORMAL HIGH (ref 0–22)

## 2011-03-07 MED ORDER — VERAPAMIL HCL 240 MG PO TBCR: 240.0000 mg | EXTENDED_RELEASE_TABLET | Freq: Every day | ORAL | Status: DC

## 2011-03-07 NOTE — Progress Notes (Signed)
Subjective:    Patient ID: Megan Murphy, female    DOB: Jan 17, 1956, 55 y.o.   MRN: [id number]  HPI  Charrise is  here for a physical;acute issues include increasing joint stiffness especially in the hands but also diffusely after sitting for a while.        Review of Systems Patient reports no vision/ hearing  changes, adenopathy,fever,  persistant / recurrent hoarseness , swallowing issues, chest pain,palpitations,edema,persistant /recurrent cough, hemoptysis, dyspnea( rest/ exertional/paroxysmal nocturnal), gastrointestinal bleeding(melena, rectal bleeding), abdominal pain, significant heartburn,  bowel changes,GU symptoms(dysuria, hematuria,pyuria, incontinence ), Gyn symptoms(abnormal  bleeding , pain),  syncope, focal weakness, memory loss, hair /nail changes,abnormal bruising or bleeding, anxiety,or depression.   Weight has increased 10 pounds last year.  She has persistent tingling in the left fourth and fifth fingers; she has no associated neck pain or radicular pain in the left upper extremity.  She has intermittent faint red rash on the legs which is nonpruritic.      Objective:   Physical Exam Gen.: Healthy and well-nourished in appearance. Alert, appropriate and cooperative throughout exam. Head: Normocephalic without obvious abnormalities Eyes: No corneal or conjunctival inflammation noted. Pupils equal round reactive to light and accommodation. Fundal exam is benign without hemorrhages, exudate, papilledema. Extraocular motion intact. Vision grossly normal. Ears: External  ear exam reveals no significant lesions or deformities. Canals clear .TMs normal. Hearing is grossly normal bilaterally. Nose: External nasal exam reveals no deformity or inflammation. Nasal mucosa are pink and moist. No lesions or exudates noted. Mouth: Oral mucosa and oropharynx reveal no lesions or exudates. Teeth in good repair. Neck: No deformities, masses, or tenderness noted. Range of motion &.  Thyroid  normal. Lungs: Normal respiratory effort; chest expands symmetrically. Lungs are clear to auscultation without rales, wheezes, or increased work of breathing. Heart: Normal rate and rhythm. Normal S1 and S2. No gallop, click, or rub. S4 with slurring; no murmur. Abdomen: Bowel sounds normal; abdomen soft and nontender. No masses, organomegaly or hernias noted. Genitalia: Dr Seymour Bars   .                                                                                   Musculoskeletal/extremities: No deformity or scoliosis noted of  the thoracic or lumbar spine. No clubbing, cyanosis, edema, or deformity noted. Range of motion  normal .Tone & strength  normal.Joints normal. Nail health  good. Vascular: Carotid, radial artery, dorsalis pedis and  posterior tibial pulses are full and equal. No bruits present. Neurologic: Alert and oriented x3. Deep tendon reflexes symmetrical and normal.          Skin: Intact without suspicious lesions; she has faint irregular rash over the lower extremities which blanches with pressure. Lymph: No cervical, axillary lymphadenopathy present. Psych: Mood and affect are normal. Normally interactive  Assessment & Plan:  #1 comprehensive physical exam; no acute findings #2 see Problem List with Assessments & Recommendations  #3 tingling in left hand suggesting compression of the ulnar nerve  #4 faint rash over the legs suggestive of vasculitis  #5 hand stiffness with negative clinical findings.  Plan: See orders Plan: see Orders

## 2011-03-07 NOTE — Patient Instructions (Signed)
Preventive Health Care: Exercise  30-45  minutes a day, 3-4 days a week. Walking is especially valuable in preventing Osteoporosis. Eat a low-fat diet with lots of fruits and vegetables, up to 7-9 servings per day. Consume less than 30 grams of sugar per day from foods & drinks with High Fructose Corn Syrup as #2,3 or #4 on label. Health Care Power of Attorney & Living Will place you in charge of your health care  decisions. Verify these are  in place.  

## 2011-03-08 LAB — RHEUMATOID FACTOR: Rhuematoid fact SerPl-aCnc: <10 IU/mL (ref <=14)

## 2011-03-14 LAB — HEMOGLOBIN A1C: Hgb A1c MFr Bld: 5.5 % (ref 4.6–6.5)

## 2011-03-27 ENCOUNTER — Other Ambulatory Visit: Payer: Self-pay | Admitting: Internal Medicine

## 2011-03-27 DIAGNOSIS — M2550 Pain in unspecified joint: Secondary | ICD-10-CM

## 2011-03-28 ENCOUNTER — Other Ambulatory Visit (INDEPENDENT_AMBULATORY_CARE_PROVIDER_SITE_OTHER): Payer: Federal, State, Local not specified - PPO

## 2011-03-28 DIAGNOSIS — M2550 Pain in unspecified joint: Secondary | ICD-10-CM

## 2011-03-28 LAB — CBC WITH DIFFERENTIAL/PLATELET
Basophils Absolute: 0 10*3/uL (ref 0.0–0.1)
Basophils Relative: 0.6 % (ref 0.0–3.0)
Eosinophils Absolute: 0.6 10*3/uL (ref 0.0–0.7)
Eosinophils Relative: 10.8 % — ABNORMAL HIGH (ref 0.0–5.0)
HCT: 40.8 % (ref 36.0–46.0)
Hemoglobin: 13.8 g/dL (ref 12.0–15.0)
Lymphocytes Relative: 34.3 % (ref 12.0–46.0)
Lymphs Abs: 1.9 10*3/uL (ref 0.7–4.0)
MCHC: 33.8 g/dL (ref 30.0–36.0)
MCV: 96.4 fl (ref 78.0–100.0)
Monocytes Absolute: 0.4 10*3/uL (ref 0.1–1.0)
Monocytes Relative: 7 % (ref 3.0–12.0)
Neutro Abs: 2.6 10*3/uL (ref 1.4–7.7)
Neutrophils Relative %: 47.3 % (ref 43.0–77.0)
Platelets: 270 10*3/uL (ref 150.0–400.0)
RBC: 4.23 Mil/uL (ref 3.87–5.11)
RDW: 12.3 % (ref 11.5–14.6)
WBC: 5.6 10*3/uL (ref 4.5–10.5)

## 2011-03-28 LAB — SEDIMENTATION RATE: Sed Rate: 23 mm/hr — ABNORMAL HIGH (ref 0–22)

## 2011-03-28 NOTE — Progress Notes (Signed)
Labs only

## 2011-05-17 ENCOUNTER — Telehealth: Payer: Self-pay

## 2011-05-17 NOTE — Telephone Encounter (Signed)
Spoke with patient per Dr.Hopper (hand-written on lab print out, to be mailed to patient) all labs normal except elevation of allergic cell count-report any diarrhea or significant muscle pain. Sed Rate(measures inflammation) improved.  I expressed our apology to the patient in the delay of responding to her labs and informed her of problem with the system and labs not populating back to MD's desktop. Patient was very understanding

## 2011-05-17 NOTE — Telephone Encounter (Signed)
Message left on voicemail: patient never informed about labs from 03/2011  I reviewed chart, labs never addressed by MD, lab did NOT populate back to his desktop (as the should) (Epic/computer error) . Labs were printed and placed on ledge for review

## 2012-01-12 ENCOUNTER — Ambulatory Visit (INDEPENDENT_AMBULATORY_CARE_PROVIDER_SITE_OTHER): Payer: Federal, State, Local not specified - PPO | Admitting: *Deleted

## 2012-01-12 DIAGNOSIS — Z23 Encounter for immunization: Secondary | ICD-10-CM

## 2012-01-12 MED ORDER — TETANUS-DIPHTH-ACELL PERTUSSIS 5-2.5-18.5 LF-MCG/0.5 IM SUSP
0.5000 mL | Freq: Once | INTRAMUSCULAR | Status: AC
  Administered 2012-01-12: 0.5 mL via INTRAMUSCULAR

## 2012-03-13 ENCOUNTER — Encounter: Payer: Self-pay | Admitting: Internal Medicine

## 2012-03-13 ENCOUNTER — Ambulatory Visit (INDEPENDENT_AMBULATORY_CARE_PROVIDER_SITE_OTHER): Payer: Federal, State, Local not specified - PPO | Admitting: Internal Medicine

## 2012-03-13 VITALS — BP 130/84 | HR 74 | Temp 98.6°F | Resp 12 | Ht 63.03 in | Wt 169.0 lb

## 2012-03-13 DIAGNOSIS — I10 Essential (primary) hypertension: Secondary | ICD-10-CM

## 2012-03-13 DIAGNOSIS — E782 Mixed hyperlipidemia: Secondary | ICD-10-CM

## 2012-03-13 DIAGNOSIS — Z780 Asymptomatic menopausal state: Secondary | ICD-10-CM

## 2012-03-13 DIAGNOSIS — J209 Acute bronchitis, unspecified: Secondary | ICD-10-CM

## 2012-03-13 DIAGNOSIS — Z0000 Encounter for general adult medical examination without abnormal findings: Secondary | ICD-10-CM

## 2012-03-13 LAB — HEPATIC FUNCTION PANEL
ALT: 17 U/L (ref 0–35)
AST: 16 U/L (ref 0–37)
Albumin: 3.9 g/dL (ref 3.5–5.2)
Alkaline Phosphatase: 116 U/L (ref 39–117)
Bilirubin, Direct: 0 mg/dL (ref 0.0–0.3)
Total Bilirubin: 0.7 mg/dL (ref 0.3–1.2)
Total Protein: 7.7 g/dL (ref 6.0–8.3)

## 2012-03-13 LAB — BASIC METABOLIC PANEL
BUN: 15 mg/dL (ref 6–23)
CO2: 28 mEq/L (ref 19–32)
Calcium: 9.3 mg/dL (ref 8.4–10.5)
Chloride: 102 mEq/L (ref 96–112)
Creatinine, Ser: 0.7 mg/dL (ref 0.4–1.2)
GFR: 96.74 mL/min (ref >60.00)
Glucose, Bld: 91 mg/dL (ref 70–99)
Potassium: 4 mEq/L (ref 3.5–5.1)
Sodium: 138 mEq/L (ref 135–145)

## 2012-03-13 LAB — CBC WITH DIFFERENTIAL/PLATELET
Basophils Absolute: 0 10*3/uL (ref 0.0–0.1)
Basophils Relative: 0.4 % (ref 0.0–3.0)
Eosinophils Absolute: 0.2 10*3/uL (ref 0.0–0.7)
Eosinophils Relative: 4 % (ref 0.0–5.0)
HCT: 42.4 % (ref 36.0–46.0)
Hemoglobin: 14.4 g/dL (ref 12.0–15.0)
Lymphocytes Relative: 40.3 % (ref 12.0–46.0)
Lymphs Abs: 2.2 10*3/uL (ref 0.7–4.0)
MCHC: 34 g/dL (ref 30.0–36.0)
MCV: 94.4 fl (ref 78.0–100.0)
Monocytes Absolute: 0.3 10*3/uL (ref 0.1–1.0)
Monocytes Relative: 5.5 % (ref 3.0–12.0)
Neutro Abs: 2.7 10*3/uL (ref 1.4–7.7)
Neutrophils Relative %: 49.8 % (ref 43.0–77.0)
Platelets: 269 10*3/uL (ref 150.0–400.0)
RBC: 4.49 Mil/uL (ref 3.87–5.11)
RDW: 12.2 % (ref 11.5–14.6)
WBC: 5.4 10*3/uL (ref 4.5–10.5)

## 2012-03-13 LAB — TSH: TSH: 1.55 u[IU]/mL (ref 0.35–5.50)

## 2012-03-13 LAB — LIPID PANEL
Cholesterol: 243 mg/dL — ABNORMAL HIGH (ref 0–200)
HDL: 91.9 mg/dL (ref >39.00)
Total CHOL/HDL Ratio: 3
Triglycerides: 74 mg/dL (ref 0.0–149.0)
VLDL: 14.8 mg/dL (ref 0.0–40.0)

## 2012-03-13 LAB — LDL CHOLESTEROL, DIRECT: Direct LDL: 134.3 mg/dL

## 2012-03-13 MED ORDER — AZITHROMYCIN 250 MG PO TABS: ORAL_TABLET | ORAL | Status: AC

## 2012-03-13 MED ORDER — FLUTICASONE-SALMETEROL 250-50 MCG/DOSE IN AEPB: 1.0000 | INHALATION_SPRAY | Freq: Two times a day (BID) | RESPIRATORY_TRACT | Status: DC

## 2012-03-13 NOTE — Progress Notes (Signed)
Subjective:    Patient ID: Megan Murphy, female    DOB: 02-Oct-1955, 56 y.o.   MRN: [id number]  HPI  Geniyah is here for a physical;acute issues include chronic respiratory symptoms.      Review of Systems  She has seen an otolaryngologist on 2 occasions and been prescribed Flonase as well as a course of antibiotics. This has not improved her symptoms. She does not have fever, chills, sweats, frontal headache, facial pain,dental pain , sore throat or nasal purulence. She has no significant extrinsic symptoms of itchy, watery eyes or sneezing. She has had a chronic cough with some discolored, white to green, sputum; she denies shortness of breath but has had some wheezing. She has no history of asthma. She's never smoked.     Objective:   Physical Exam Gen.: Healthy and well-nourished in appearance. Alert, appropriate and cooperative throughout exam. Head: Normocephalic without obvious abnormalities  Eyes: No corneal or conjunctival inflammation noted. Pupils equal round reactive to light and accommodation. Fundal exam is benign without hemorrhages, exudate, papilledema. Extraocular motion intact. Vision grossly normal. Ears: External  ear exam reveals no significant lesions or deformities. Canals clear .TMs normal. Hearing is grossly normal bilaterally. Nose: External nasal exam reveals no deformity or inflammation. Nasal mucosa are pink and moist. No lesions or exudates noted.  Mouth: Oral mucosa and oropharynx reveal no lesions or exudates. Teeth in good repair. Neck: No deformities, masses, or tenderness noted. Range of motion & Thyroid normal Lungs: Normal respiratory effort; chest expands symmetrically. Lungs are clear to auscultation without rales, wheezes, or increased work of breathing. Heart: Normal rate and rhythm. Normal S1 and S2. No gallop, click, or rub. S4 with slurring ; no murmur  Abdomen: Bowel sounds normal; abdomen soft and nontender. No masses, organomegaly or hernias  noted. Genitalia:Dr Lavoie                                                                    Musculoskeletal/extremities: No deformity or scoliosis noted of  the thoracic or lumbar spine. No clubbing, cyanosis, edema, or deformity noted. Range of motion  normal .Tone & strength  normal.Joints normal. Nail health  good. Vascular: Carotid, radial artery, dorsalis pedis and  posterior tibial pulses are full and equal. No bruits present. Neurologic: Alert and oriented x3. Deep tendon reflexes symmetrical and normal.          Skin: Intact without suspicious lesions or rashes. Lymph: No cervical, axillary lymphadenopathy present. Psych: Mood and affect are normal. Normally interactive                                                                                         Assessment & Plan:  #1 comprehensive physical exam; no acute findings #2 bronchitis with history of some wheezing. Clinically no reactive airways disease present at this time. History does not suggest rhinosinusitis #3 see Problem List with Assessments &  Recommendations  Note: Computer describes as abnormal with nonspecific T changes. The T is slightly low in lead 1 and slightly inverted in lead 3. These are stable and nonspecific, minor changes seen August 2011. Plan: see Orders

## 2012-03-13 NOTE — Patient Instructions (Addendum)
Preventive Health Care: Exercise  30-45  minutes a day, 3-4 days a week. Walking is especially valuable in preventing Osteoporosis. Eat a low-fat diet with lots of fruits and vegetables, up to 7-9 servings per day.  Consume less than 30 grams of sugar per day from foods & drinks with High Fructose Corn Syrup as #1,2,3 or #4 on label. Health Care Power of Attorney & Living Will place you in charge of your health care  decisions. Verify these are  in place. Blood Pressure Goal  Ideally is an AVERAGE < 135/85. This AVERAGE should be calculated from @ least 5-7 BP readings taken @ different times of day on different days of week. You should not respond to isolated BP readings , but rather the AVERAGE for that week . Please take enteric-coated aspirin 81 mg daily with breakfast.  Advair one inhalation every 12 hours; gargle and spit after use . Take the EKG to any emergency room or preop visits. There are nonspecific changes; as long as there is no new change these are not clinically significant Please try to go on My Chart within the next 24 hours to allow me to release the results directly to you.

## 2012-05-29 ENCOUNTER — Other Ambulatory Visit: Payer: Self-pay | Admitting: Internal Medicine

## 2012-08-29 ENCOUNTER — Ambulatory Visit (INDEPENDENT_AMBULATORY_CARE_PROVIDER_SITE_OTHER): Payer: Federal, State, Local not specified - PPO | Admitting: Internal Medicine

## 2012-08-29 ENCOUNTER — Encounter: Payer: Self-pay | Admitting: Internal Medicine

## 2012-08-29 VITALS — BP 144/88 | HR 81 | Wt 172.0 lb

## 2012-08-29 DIAGNOSIS — I10 Essential (primary) hypertension: Secondary | ICD-10-CM

## 2012-08-29 MED ORDER — LOSARTAN POTASSIUM 100 MG PO TABS: ORAL_TABLET | ORAL | Status: DC

## 2012-08-29 NOTE — Progress Notes (Signed)
  Subjective:    Patient ID: Megan Murphy, female    DOB: Sep 06, 1955, 57 y.o.   MRN: [id number]  HPI  Her blood pressure was found to be elevated at her gynecologist's office. Her recordings reveal values of 130/86-158/110. There is no specific trigger such as increased sodium, decongestants, or stressors for the blood pressure elevation.  She denies chest pain, palpitations, dyspnea, claudication, or significant edema. She also has no paroxysmal nocturnal dyspnea    Review of Systems She has had some popping in the ears; she has not used her Flonase. She's noted isolated epistaxis. She describes some tension headaches which are not significant.     Objective:   Physical Exam General appearance:good health ;well nourished; no acute distress or increased work of breathing is present.  No  lymphadenopathy about the head, neck, or axilla noted.   Eyes: No conjunctival inflammation or lid edema is present. There is no scleral icterus.  Ears:  External ear exam shows no significant lesions or deformities.  Otoscopic examination reveals clear canals, tympanic membranes are intact bilaterally without bulging, retraction, inflammation or discharge.  Nose:  External nasal examination shows no deformity or inflammation. Nasal mucosa are pink and moist without lesions or exudates. No septal dislocation or deviation.No obstruction to airflow.   Oral exam: Dental hygiene is good; lips and gums are healthy appearing.There is no oropharyngeal erythema or exudate noted.   Neck:  No deformities,  masses, or tenderness noted.     Heart:  Normal rate and regular rhythm. S1 and S2 normal without gallop, murmur, click, rub. S 4.   Lungs:Chest clear to auscultation; no wheezes, rhonchi,rales ,or rubs present.No increased work of breathing.    Extremities:  No cyanosis, edema, or clubbing  noted     All pulses intact without  bruits .No ischemic skin changes.  No AAA   Skin: Warm & dry .           Assessment & Plan:

## 2012-08-29 NOTE — Patient Instructions (Addendum)
Blood Pressure Goal  Ideally is an AVERAGE < 135/85. This AVERAGE should be calculated from @ least 5-7 BP readings taken @ different times of day on different days of week. You should not respond to isolated BP readings , but rather the AVERAGE for that week.  Plain Mucinex for thick secretions ;force NON dairy fluids . Use a Neti pot daily as needed for sinus congestion; going from open side to congested side . Nasal cleansing in the shower as discussed. Make sure that all residual soap is removed to prevent irritation. Fluticasone 1 spray in each nostril twice a day as needed. Use the "crossover" technique as discussed. Plain Allegra 160 mg or Loratidine 10 mg daily as needed for itchy eyes & sneezing.

## 2012-08-29 NOTE — Assessment & Plan Note (Signed)
Add  & titrate Losartan to keep BP < 135/85

## 2012-10-05 ENCOUNTER — Other Ambulatory Visit: Payer: Self-pay

## 2012-10-14 LAB — HM PAP SMEAR

## 2012-10-15 ENCOUNTER — Telehealth: Payer: Self-pay | Admitting: Internal Medicine

## 2012-10-15 NOTE — Telephone Encounter (Signed)
Patient Information:  Caller Name: Ketina  Phone: [phone]  Patient: Megan Murphy  Gender: Female  DOB: 05-31-1956  Age: 57 Years  PCP: Marga Melnick  Office Follow Up:  Does the office need to follow up with this patient?: No  Instructions For The Office: N/A  RN Note:  States has had ear congestion x 3 weeks, but has improved.  States sinuses ache above the left ear as well.  Per ear congestion protocol, emergent symptoms denied; advised appt within 72 hours.  Appt scheduled 10/16/12 1300 with Dr. Alwyn Ren.  krs/can  Symptoms  Reason For Call & Symptoms: earache and congestion  Reviewed Health History In EMR: Yes  Reviewed Medications In EMR: Yes  Reviewed Allergies In EMR: Yes  Reviewed Surgeries / Procedures: Yes  Date of Onset of Symptoms: 09/24/2012  Guideline(s) Used:  Ear - Congestion  Disposition Per Guideline:   See Within 3 Days in Office  Reason For Disposition Reached:   Ear congestion present > 48 hours  Advice Given:  N/A  Appointment Scheduled:  10/16/2012 13:00:00 Appointment Scheduled Provider:  Marga Melnick

## 2012-10-15 NOTE — Telephone Encounter (Signed)
Appointment Scheduled:  10/16/2012 13:00:00  Appointment Scheduled Provider:  Marga Melnick

## 2012-10-16 ENCOUNTER — Ambulatory Visit: Payer: Self-pay | Admitting: Internal Medicine

## 2012-10-16 ENCOUNTER — Encounter: Payer: Self-pay | Admitting: Internal Medicine

## 2012-10-16 ENCOUNTER — Ambulatory Visit (INDEPENDENT_AMBULATORY_CARE_PROVIDER_SITE_OTHER): Payer: Federal, State, Local not specified - PPO | Admitting: Internal Medicine

## 2012-10-16 VITALS — BP 122/88 | HR 86 | Temp 97.6°F | Wt 170.0 lb

## 2012-10-16 DIAGNOSIS — H6690 Otitis media, unspecified, unspecified ear: Secondary | ICD-10-CM

## 2012-10-16 DIAGNOSIS — J209 Acute bronchitis, unspecified: Secondary | ICD-10-CM

## 2012-10-16 DIAGNOSIS — H6692 Otitis media, unspecified, left ear: Secondary | ICD-10-CM

## 2012-10-16 MED ORDER — NEOMYCIN-POLYMYXIN-HC 3.5-10000-1 OT SUSP: 3.0000 [drp] | Freq: Four times a day (QID) | OTIC | Status: DC

## 2012-10-16 MED ORDER — CEFUROXIME AXETIL 500 MG PO TABS: 500.0000 mg | ORAL_TABLET | Freq: Two times a day (BID) | ORAL | Status: DC

## 2012-10-16 NOTE — Progress Notes (Signed)
  Subjective:    Patient ID: Megan Murphy, female    DOB: 06-24-1956, 57 y.o.   MRN: [id number]  HPI The respiratory tract symptoms began 3-4 weeks ago as L ear pressure,  head congestion, chest congestion, & cough with   green sputum.  Exposures reported  to sick family (husband & grandchildren).    Significant active  associated symptoms include dental pain & head congestion.   Cough is not associated with wheezing  & dyspnea .   Chronic watery eyes  present .    Flu shot not current  Treatment with  Mucinex,  Sudafed, &  Alka Seltzer Plus, & Flonase was partially effective. There is no history of asthma ,  seasonal or perennial allergies.  The patient had never smoked              Review of Systems  Symptoms not present include frontal headache, facial pain, sore throat,  nasal purulence,   &  otic discharge. Fever, chills, sweats were not present . Itchy eyes &  sneezing were not noted. Myalgias and arthralgias were not present.      Objective:   Physical Exam General appearance:good health ;well nourished; no acute distress or increased work of breathing is present.   Eyes: No conjunctival inflammation or lid edema is present.  Ears:  External ear exam shows no significant lesions or deformities.  Otoscopic examination reveals some waxon R ; L TM erythematous without bulging, retraction,  or discharge. Nose:  External nasal examination shows no deformity or inflammation. Nasal mucosa are pink and moist without lesions or exudates. No septal dislocation or deviation.No obstruction to airflow. Hyponasal speech Oral exam: Dental hygiene is good; lips and gums are healthy appearing.There is no oropharyngeal erythema or exudate noted.  Neck:  No deformities, masses, or tenderness noted.    Heart:  Normal rate and regular rhythm. S1 and S2 normal without gallop, click, rub or murmur.  Grade 1 murmur Lungs:Chest clear to auscultation; no wheezes, rhonchi,rales ,or  rubs present.No increased work of breathing.   Extremities:  No cyanosis, edema, or clubbing  noted  No  lymphadenopathy about the head, neck, or axilla noted.  Skin: Warm & dry              Assessment & Plan:  #1 acute bronchitis w/o bronchospasm #2 URI, acute with L otitis Plan: See orders and recommendations

## 2012-10-16 NOTE — Patient Instructions (Addendum)
Please do not use Q-tips as we discussed. Should wax build up occur, please put 2-3 drops of mineral oil in the affected  ear at night to soften the wax .Cover the canal with a  cotton ball to prevent the oil from staining bed linens. In the morning fill the ear canal with hydrogen peroxide & lie in the opposite lateral decubitus position(on the side opposite the affected ear)  for 10-15 minutes. After allowing this period of time for the peroxide to dissolve the wax ;shower and use the thinnest washrag available to wick out the wax. If both ears are involved ; alternate this treatment from ear to ear each night until no wax is found on the washrag. 

## 2012-10-18 ENCOUNTER — Telehealth: Payer: Self-pay | Admitting: *Deleted

## 2012-10-18 MED ORDER — OSELTAMIVIR PHOSPHATE 75 MG PO CAPS: 75.0000 mg | ORAL_CAPSULE | Freq: Every day | ORAL | Status: DC

## 2012-10-18 NOTE — Telephone Encounter (Signed)
Pt left VM that she was exposed to H1N1 and would like to know if she needs to get started on Tamiflu. .Please advise

## 2012-10-18 NOTE — Telephone Encounter (Addendum)
Rx sent, Discuss with patient  

## 2012-10-18 NOTE — Telephone Encounter (Signed)
tamiflu 75 mg qd x 10 days (NOT 1 bid )

## 2013-02-22 ENCOUNTER — Other Ambulatory Visit: Payer: Self-pay | Admitting: Internal Medicine

## 2013-02-24 ENCOUNTER — Other Ambulatory Visit: Payer: Self-pay | Admitting: Dermatology

## 2013-02-28 ENCOUNTER — Ambulatory Visit (INDEPENDENT_AMBULATORY_CARE_PROVIDER_SITE_OTHER): Payer: Federal, State, Local not specified - PPO | Admitting: Internal Medicine

## 2013-02-28 ENCOUNTER — Encounter: Payer: Self-pay | Admitting: Internal Medicine

## 2013-02-28 VITALS — BP 122/78 | HR 75 | Wt 167.0 lb

## 2013-02-28 DIAGNOSIS — D1722 Benign lipomatous neoplasm of skin and subcutaneous tissue of left arm: Secondary | ICD-10-CM

## 2013-02-28 DIAGNOSIS — D1739 Benign lipomatous neoplasm of skin and subcutaneous tissue of other sites: Secondary | ICD-10-CM

## 2013-02-28 NOTE — Patient Instructions (Addendum)
Please report warning signs as we discussed. Worrisome would be change in color or size, increased pain, fever, or associated constitutional symptoms.

## 2013-02-28 NOTE — Progress Notes (Signed)
  Subjective:    Patient ID: Megan Murphy, female    DOB: February 11, 1956, 57 y.o.   MRN: [id number]  HPI  2 weeks ago she noted a mass lesion over the left anterior shoulder incidentally. There was no change in color or temperature. She actually believes this may have decreased slightly in size. There was no associated pain or tenderness with the lesion.  This week she noticed some chilling at night but attributed this to the rainy weather.  Past history is noncontributory. She has had 2 basal cell cancers removed by Dr. Danella Deis.    Review of Systems  She denies fever, sweats, or weight loss of significance     Objective:   Physical Exam General appearance is one of good health and nourishment w/o distress.  Eyes: No conjunctival inflammation or scleral icterus is present.  Thyroid is normal to palpation with no nodularity or enlargement  There is a quarter-sized movable subcutaneous structure at the left anterior shoulder. This transilluminates.   Abdomen: bowel sounds normal, soft and non-tender without masses, organomegaly or hernias noted.  No guarding or rebound   Skin:Warm & dry.  Intact without suspicious lesions or rashes ; no jaundice or tenting  Lymphatic: No lymphadenopathy is noted about the head, neck, axilla             Assessment & Plan:  #1 lipoma left anterior shoulder. No associated cervical or axillary lymphadenopathy. No organomegaly present. No associated constitutional symptoms of significance.  Plan: Pathophysiology discussed.

## 2013-06-26 ENCOUNTER — Other Ambulatory Visit: Payer: Self-pay

## 2013-08-02 ENCOUNTER — Other Ambulatory Visit: Payer: Self-pay | Admitting: Internal Medicine

## 2013-08-04 NOTE — Telephone Encounter (Signed)
Rx sent to the pharmacy by e-script.//AB/CMA 

## 2013-08-19 ENCOUNTER — Other Ambulatory Visit: Payer: Self-pay | Admitting: Internal Medicine

## 2013-08-20 NOTE — Telephone Encounter (Signed)
Verapamil refilled per protocol. JG//CMA 

## 2013-08-22 ENCOUNTER — Other Ambulatory Visit: Payer: Self-pay | Admitting: Internal Medicine

## 2013-08-25 NOTE — Telephone Encounter (Signed)
Verapamil refilled per protocol. JG//CMA 

## 2014-02-28 LAB — HM MAMMOGRAPHY

## 2014-03-04 ENCOUNTER — Other Ambulatory Visit: Payer: Self-pay | Admitting: Dermatology

## 2014-03-24 ENCOUNTER — Other Ambulatory Visit: Payer: Self-pay

## 2014-03-24 MED ORDER — VERAPAMIL HCL ER 240 MG PO TBCR: EXTENDED_RELEASE_TABLET | ORAL | Status: DC

## 2014-04-13 ENCOUNTER — Encounter: Payer: Self-pay | Admitting: Family Medicine

## 2014-04-13 ENCOUNTER — Ambulatory Visit (INDEPENDENT_AMBULATORY_CARE_PROVIDER_SITE_OTHER): Payer: Federal, State, Local not specified - PPO | Admitting: Family Medicine

## 2014-04-13 VITALS — BP 126/80 | HR 70 | Temp 98.1°F | Ht 63.0 in | Wt 172.0 lb

## 2014-04-13 DIAGNOSIS — I10 Essential (primary) hypertension: Secondary | ICD-10-CM

## 2014-04-13 DIAGNOSIS — R0602 Shortness of breath: Secondary | ICD-10-CM

## 2014-04-13 DIAGNOSIS — E785 Hyperlipidemia, unspecified: Secondary | ICD-10-CM

## 2014-04-13 DIAGNOSIS — Z0000 Encounter for general adult medical examination without abnormal findings: Secondary | ICD-10-CM

## 2014-04-13 MED ORDER — VERAPAMIL HCL ER 240 MG PO TBCR: EXTENDED_RELEASE_TABLET | ORAL | Status: DC

## 2014-04-13 MED ORDER — LOSARTAN POTASSIUM 100 MG PO TABS: ORAL_TABLET | ORAL | Status: DC

## 2014-04-13 NOTE — Progress Notes (Signed)
Subjective:     Megan Murphy is a 58 y.o. female and is here for a comprehensive physical exam. The patient reports no problems.  History   Social History  . Marital Status: Married    Spouse Name: N/A    Number of Children: N/A  . 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 Smoking status: Never Smoker   . Smokeless tobacco: Not on file  . Alcohol Use: 0.0 oz/week     Comment:  rarely  . Drug Use: No  . Sexual Activity: Not on file   Other Topics Concern  . Not on file   Social History Narrative   Exercise-- no   Health Maintenance  Topic Date Due  . Influenza Vaccine  05/14/2014 (Originally 03/21/2014)  . Pap Smear  10/15/2015  . Mammogram  02/29/2016  . Colonoscopy  05/24/2020  . Tetanus/tdap  01/11/2022    The following portions of the patient's history were reviewed and updated as appropriate:  She  has a past medical history of History of migraine headaches; Hyperlipidemia; Hypertension; and Basal cell cancer. She  does not have any pertinent problems on file. She  has past surgical history that includes dilation and curretage; Colonoscopy (2012); Excision basal cell carcinoma; Wisdom tooth extraction; and Cesarean section. Her family history includes Cancer in her maternal grandmother and mother; Colon polyps in her sister; Heart attack (age of onset: 47) in her brother; Hypertension in her brother and sister; Stroke (age of onset: 73) in her sister; Stroke (age of onset: 68) in her brother. There is no history of Diabetes. She  reports that she has never smoked. She does not have any smokeless tobacco history on file. She reports that she drinks alcohol. She reports that she does not use illicit drugs. She has a current medication list which includes the following prescription(s): losartan and verapamil. Current Outpatient Prescriptions on File Prior to Visit  Medication Sig Dispense Refill   . losartan (COZAAR) 100 MG tablet TAKE 1/2 TO 1 TABLET BY MOUTH EVERY DAY TO KEEP BLOOD PRESSURE <135/85  30 tablet  5  . verapamil (CALAN-SR) 240 MG CR tablet TAKE 1 TABLET BY MOUTH ONCE DAILY  90 tablet  1   No current facility-administered medications on file prior to visit.   She is allergic to lisinopril..  Review of Systems Review of Systems  Constitutional: Negative for activity change, appetite change and fatigue.  HENT: Negative for hearing loss, congestion, tinnitus and ear discharge.  dentist q49m Eyes: Negative for visual disturbance (see optho q2y -- vision corrected to 20/20 with glasses).  Respiratory: Negative for cough, chest tightness and shortness of breath.   Cardiovascular: Negative for chest pain, palpitations and leg swelling.  Gastrointestinal: Negative for abdominal pain, diarrhea, constipation and abdominal distention.  Genitourinary: Negative for urgency, frequency, decreased urine volume and difficulty urinating.  Musculoskeletal: Negative for back pain, arthralgias and gait problem.  Skin: Negative for color change, pallor and rash.  Neurological: Negative for dizziness, light-headedness, numbness and headaches.  Hematological: Negative for adenopathy. Does not bruise/bleed easily.  Psychiatric/Behavioral: Negative for suicidal ideas, confusion, sleep disturbance, self-injury, dysphoric mood, decreased concentration and agitation.       Objective:    BP 126/80  Pulse 70  Temp(Src) 98.1 F (36.7 C) (Oral)  Ht 5\' 3"  (1.6 m)  Wt 172 lb (78.019 kg)  BMI 30.48 kg/m2  SpO2 98% General  appearance: alert, cooperative, appears stated age and no distress Head: Normocephalic, without obvious abnormality, atraumatic Eyes: conjunctivae/corneas clear. PERRL, EOM's intact. Fundi benign. Ears: normal TM's and external ear canals both ears Nose: Nares normal. Septum midline. Mucosa normal. No drainage or sinus tenderness. Throat: lips, mucosa, and tongue normal;  teeth and gums normal Neck: no adenopathy, no carotid bruit, no JVD, supple, symmetrical, trachea midline and thyroid not enlarged, symmetric, no tenderness/mass/nodules Back: symmetric, no curvature. ROM normal. No CVA tenderness. Lungs: clear to auscultation bilaterally Breasts: gyn Heart: regular rate and rhythm, S1, S2 normal, no murmur, click, rub or gallop Abdomen: soft, non-tender; bowel sounds normal; no masses,  no organomegaly Pelvic: deferred--gyn Extremities: extremities normal, atraumatic, no cyanosis or edema Pulses: 2+ and symmetric Skin: Skin color, texture, turgor normal. No rashes or lesions Lymph nodes: Cervical, supraclavicular, and axillary nodes normal. Neurologic: Alert and oriented X 3, normal strength and tone. Normal symmetric reflexes. Normal coordination and gait    Assessment:    Healthy female exam.      Plan:    ghm utd Check labs See After Visit Summary for Counseling Recommendations   1. Essential hypertension Stable, con't meds - Basic metabolic panel; Future - CBC with Differential; Future - Hepatic function panel; Future - Lipid panel; Future - POCT urinalysis dipstick; Future - TSH; Future - losartan (COZAAR) 100 MG tablet; TAKE 1/2 TO 1 TABLET BY MOUTH EVERY DAY TO KEEP BLOOD PRESSURE <135/85  Dispense: 90 tablet; Refill: 1 - verapamil (CALAN-SR) 240 MG CR tablet; TAKE 1 TABLET BY MOUTH ONCE DAILY  Dispense: 90 tablet; Refill: 1  2. Other and unspecified hyperlipidemia Check labs,  - Basic metabolic panel; Future - CBC with Differential; Future - Hepatic function panel; Future - Lipid panel; Future - POCT urinalysis dipstick; Future - TSH; Future  3. Preventative health care   - Basic metabolic panel; Future - CBC with Differential; Future - Hepatic function panel; Future - Lipid panel; Future - POCT urinalysis dipstick; Future - TSH; Future  4. SOB-- pt admits to not exercising as much    ekg NSR    rto if symptoms cont --   Check echo or cardiology

## 2014-04-13 NOTE — Patient Instructions (Signed)
Preventive Care for Adults A healthy lifestyle and preventive care can promote health and wellness. Preventive health guidelines for women include the following key practices.  A routine yearly physical is a good way to check with your health care provider about your health and preventive screening. It is a chance to share any concerns and updates on your health and to receive a thorough exam.  Visit your dentist for a routine exam and preventive care every 6 months. Brush your teeth twice a day and floss once a day. Good oral hygiene prevents tooth decay and gum disease.  The frequency of eye exams is based on your age, health, family medical history, use of contact lenses, and other factors. Follow your health care provider's recommendations for frequency of eye exams.  Eat a healthy diet. Foods like vegetables, fruits, whole grains, low-fat dairy products, and lean protein foods contain the nutrients you need without too many calories. Decrease your intake of foods high in solid fats, added sugars, and salt. Eat the right amount of calories for you.Get information about a proper diet from your health care provider, if necessary.  Regular physical exercise is one of the most important things you can do for your health. Most adults should get at least 150 minutes of moderate-intensity exercise (any activity that increases your heart rate and causes you to sweat) each week. In addition, most adults need muscle-strengthening exercises on 2 or more days a week.  Maintain a healthy weight. The body mass index (BMI) is a screening tool to identify possible weight problems. It provides an estimate of body fat based on height and weight. Your health care provider can find your BMI and can help you achieve or maintain a healthy weight.For adults 20 years and older:  A BMI below 18.5 is considered underweight.  A BMI of 18.5 to 24.9 is normal.  A BMI of 25 to 29.9 is considered overweight.  A BMI of  30 and above is considered obese.  Maintain normal blood lipids and cholesterol levels by exercising and minimizing your intake of saturated fat. Eat a balanced diet with plenty of fruit and vegetables. Blood tests for lipids and cholesterol should begin at age 76 and be repeated every 5 years. If your lipid or cholesterol levels are high, you are over 50, or you are at high risk for heart disease, you may need your cholesterol levels checked more frequently.Ongoing high lipid and cholesterol levels should be treated with medicines if diet and exercise are not working.  If you smoke, find out from your health care provider how to quit. If you do not use tobacco, do not start.  Lung cancer screening is recommended for adults aged 22-80 years who are at high risk for developing lung cancer because of a history of smoking. A yearly low-dose CT scan of the lungs is recommended for people who have at least a 30-pack-year history of smoking and are a current smoker or have quit within the past 15 years. A pack year of smoking is smoking an average of 1 pack of cigarettes a day for 1 year (for example: 1 pack a day for 30 years or 2 packs a day for 15 years). Yearly screening should continue until the smoker has stopped smoking for at least 15 years. Yearly screening should be stopped for people who develop a health problem that would prevent them from having lung cancer treatment.  If you are pregnant, do not drink alcohol. If you are breastfeeding,  be very cautious about drinking alcohol. If you are not pregnant and choose to drink alcohol, do not have more than 1 drink per day. One drink is considered to be 12 ounces (355 mL) of beer, 5 ounces (148 mL) of wine, or 1.5 ounces (44 mL) of liquor.  Avoid use of street drugs. Do not share needles with anyone. Ask for help if you need support or instructions about stopping the use of drugs.  High blood pressure causes heart disease and increases the risk of  stroke. Your blood pressure should be checked at least every 1 to 2 years. Ongoing high blood pressure should be treated with medicines if weight loss and exercise do not work.  If you are 75-52 years old, ask your health care provider if you should take aspirin to prevent strokes.  Diabetes screening involves taking a blood sample to check your fasting blood sugar level. This should be done once every 3 years, after age 15, if you are within normal weight and without risk factors for diabetes. Testing should be considered at a younger age or be carried out more frequently if you are overweight and have at least 1 risk factor for diabetes.  Breast cancer screening is essential preventive care for women. You should practice "breast self-awareness." This means understanding the normal appearance and feel of your breasts and may include breast self-examination. Any changes detected, no matter how small, should be reported to a health care provider. Women in their 58s and 30s should have a clinical breast exam (CBE) by a health care provider as part of a regular health exam every 1 to 3 years. After age 16, women should have a CBE every year. Starting at age 53, women should consider having a mammogram (breast X-ray test) every year. Women who have a family history of breast cancer should talk to their health care provider about genetic screening. Women at a high risk of breast cancer should talk to their health care providers about having an MRI and a mammogram every year.  Breast cancer gene (BRCA)-related cancer risk assessment is recommended for women who have family members with BRCA-related cancers. BRCA-related cancers include breast, ovarian, tubal, and peritoneal cancers. Having family members with these cancers may be associated with an increased risk for harmful changes (mutations) in the breast cancer genes BRCA1 and BRCA2. Results of the assessment will determine the need for genetic counseling and  BRCA1 and BRCA2 testing.  Routine pelvic exams to screen for cancer are no longer recommended for nonpregnant women who are considered low risk for cancer of the pelvic organs (ovaries, uterus, and vagina) and who do not have symptoms. Ask your health care provider if a screening pelvic exam is right for you.  If you have had past treatment for cervical cancer or a condition that could lead to cancer, you need Pap tests and screening for cancer for at least 20 years after your treatment. If Pap tests have been discontinued, your risk factors (such as having a new sexual partner) need to be reassessed to determine if screening should be resumed. Some women have medical problems that increase the chance of getting cervical cancer. In these cases, your health care provider may recommend more frequent screening and Pap tests.  The HPV test is an additional test that may be used for cervical cancer screening. The HPV test looks for the virus that can cause the cell changes on the cervix. The cells collected during the Pap test can be  tested for HPV. The HPV test could be used to screen women aged 30 years and older, and should be used in women of any age who have unclear Pap test results. After the age of 30, women should have HPV testing at the same frequency as a Pap test.  Colorectal cancer can be detected and often prevented. Most routine colorectal cancer screening begins at the age of 50 years and continues through age 75 years. However, your health care provider may recommend screening at an earlier age if you have risk factors for colon cancer. On a yearly basis, your health care provider may provide home test kits to check for hidden blood in the stool. Use of a small camera at the end of a tube, to directly examine the colon (sigmoidoscopy or colonoscopy), can detect the earliest forms of colorectal cancer. Talk to your health care provider about this at age 50, when routine screening begins. Direct  exam of the colon should be repeated every 5-10 years through age 75 years, unless early forms of pre-cancerous polyps or small growths are found.  People who are at an increased risk for hepatitis B should be screened for this virus. You are considered at high risk for hepatitis B if:  You were born in a country where hepatitis B occurs often. Talk with your health care provider about which countries are considered high risk.  Your parents were born in a high-risk country and you have not received a shot to protect against hepatitis B (hepatitis B vaccine).  You have HIV or AIDS.  You use needles to inject street drugs.  You live with, or have sex with, someone who has hepatitis B.  You get hemodialysis treatment.  You take certain medicines for conditions like cancer, organ transplantation, and autoimmune conditions.  Hepatitis C blood testing is recommended for all people born from 1945 through 1965 and any individual with known risks for hepatitis C.  Practice safe sex. Use condoms and avoid high-risk sexual practices to reduce the spread of sexually transmitted infections (STIs). STIs include gonorrhea, chlamydia, syphilis, trichomonas, herpes, HPV, and human immunodeficiency virus (HIV). Herpes, HIV, and HPV are viral illnesses that have no cure. They can result in disability, cancer, and death.  You should be screened for sexually transmitted illnesses (STIs) including gonorrhea and chlamydia if:  You are sexually active and are younger than 24 years.  You are older than 24 years and your health care provider tells you that you are at risk for this type of infection.  Your sexual activity has changed since you were last screened and you are at an increased risk for chlamydia or gonorrhea. Ask your health care provider if you are at risk.  If you are at risk of being infected with HIV, it is recommended that you take a prescription medicine daily to prevent HIV infection. This is  called preexposure prophylaxis (PrEP). You are considered at risk if:  You are a heterosexual woman, are sexually active, and are at increased risk for HIV infection.  You take drugs by injection.  You are sexually active with a partner who has HIV.  Talk with your health care provider about whether you are at high risk of being infected with HIV. If you choose to begin PrEP, you should first be tested for HIV. You should then be tested every 3 months for as long as you are taking PrEP.  Osteoporosis is a disease in which the bones lose minerals and strength   with aging. This can result in serious bone fractures or breaks. The risk of osteoporosis can be identified using a bone density scan. Women ages 65 years and over and women at risk for fractures or osteoporosis should discuss screening with their health care providers. Ask your health care provider whether you should take a calcium supplement or vitamin D to reduce the rate of osteoporosis.  Menopause can be associated with physical symptoms and risks. Hormone replacement therapy is available to decrease symptoms and risks. You should talk to your health care provider about whether hormone replacement therapy is right for you.  Use sunscreen. Apply sunscreen liberally and repeatedly throughout the day. You should seek shade when your shadow is shorter than you. Protect yourself by wearing long sleeves, pants, a wide-brimmed hat, and sunglasses year round, whenever you are outdoors.  Once a month, do a whole body skin exam, using a mirror to look at the skin on your back. Tell your health care provider of new moles, moles that have irregular borders, moles that are larger than a pencil eraser, or moles that have changed in shape or color.  Stay current with required vaccines (immunizations).  Influenza vaccine. All adults should be immunized every year.  Tetanus, diphtheria, and acellular pertussis (Td, Tdap) vaccine. Pregnant women should  receive 1 dose of Tdap vaccine during each pregnancy. The dose should be obtained regardless of the length of time since the last dose. Immunization is preferred during the 27th-36th week of gestation. An adult who has not previously received Tdap or who does not know her vaccine status should receive 1 dose of Tdap. This initial dose should be followed by tetanus and diphtheria toxoids (Td) booster doses every 10 years. Adults with an unknown or incomplete history of completing a 3-dose immunization series with Td-containing vaccines should begin or complete a primary immunization series including a Tdap dose. Adults should receive a Td booster every 10 years.  Varicella vaccine. An adult without evidence of immunity to varicella should receive 2 doses or a second dose if she has previously received 1 dose. Pregnant females who do not have evidence of immunity should receive the first dose after pregnancy. This first dose should be obtained before leaving the health care facility. The second dose should be obtained 4-8 weeks after the first dose.  Human papillomavirus (HPV) vaccine. Females aged 13-26 years who have not received the vaccine previously should obtain the 3-dose series. The vaccine is not recommended for use in pregnant females. However, pregnancy testing is not needed before receiving a dose. If a female is found to be pregnant after receiving a dose, no treatment is needed. In that case, the remaining doses should be delayed until after the pregnancy. Immunization is recommended for any person with an immunocompromised condition through the age of 26 years if she did not get any or all doses earlier. During the 3-dose series, the second dose should be obtained 4-8 weeks after the first dose. The third dose should be obtained 24 weeks after the first dose and 16 weeks after the second dose.  Zoster vaccine. One dose is recommended for adults aged 60 years or older unless certain conditions are  present.  Measles, mumps, and rubella (MMR) vaccine. Adults born before 1957 generally are considered immune to measles and mumps. Adults born in 1957 or later should have 1 or more doses of MMR vaccine unless there is a contraindication to the vaccine or there is laboratory evidence of immunity to   each of the three diseases. A routine second dose of MMR vaccine should be obtained at least 28 days after the first dose for students attending postsecondary schools, health care workers, or international travelers. People who received inactivated measles vaccine or an unknown type of measles vaccine during 1963-1967 should receive 2 doses of MMR vaccine. People who received inactivated mumps vaccine or an unknown type of mumps vaccine before 1979 and are at high risk for mumps infection should consider immunization with 2 doses of MMR vaccine. For females of childbearing age, rubella immunity should be determined. If there is no evidence of immunity, females who are not pregnant should be vaccinated. If there is no evidence of immunity, females who are pregnant should delay immunization until after pregnancy. Unvaccinated health care workers born before 1957 who lack laboratory evidence of measles, mumps, or rubella immunity or laboratory confirmation of disease should consider measles and mumps immunization with 2 doses of MMR vaccine or rubella immunization with 1 dose of MMR vaccine.  Pneumococcal 13-valent conjugate (PCV13) vaccine. When indicated, a person who is uncertain of her immunization history and has no record of immunization should receive the PCV13 vaccine. An adult aged 19 years or older who has certain medical conditions and has not been previously immunized should receive 1 dose of PCV13 vaccine. This PCV13 should be followed with a dose of pneumococcal polysaccharide (PPSV23) vaccine. The PPSV23 vaccine dose should be obtained at least 8 weeks after the dose of PCV13 vaccine. An adult aged 19  years or older who has certain medical conditions and previously received 1 or more doses of PPSV23 vaccine should receive 1 dose of PCV13. The PCV13 vaccine dose should be obtained 1 or more years after the last PPSV23 vaccine dose.  Pneumococcal polysaccharide (PPSV23) vaccine. When PCV13 is also indicated, PCV13 should be obtained first. All adults aged 65 years and older should be immunized. An adult younger than age 65 years who has certain medical conditions should be immunized. Any person who resides in a nursing home or long-term care facility should be immunized. An adult smoker should be immunized. People with an immunocompromised condition and certain other conditions should receive both PCV13 and PPSV23 vaccines. People with human immunodeficiency virus (HIV) infection should be immunized as soon as possible after diagnosis. Immunization during chemotherapy or radiation therapy should be avoided. Routine use of PPSV23 vaccine is not recommended for American Indians, [place] Natives, or people younger than 65 years unless there are medical conditions that require PPSV23 vaccine. When indicated, people who have unknown immunization and have no record of immunization should receive PPSV23 vaccine. One-time revaccination 5 years after the first dose of PPSV23 is recommended for people aged 19-64 years who have chronic kidney failure, nephrotic syndrome, asplenia, or immunocompromised conditions. People who received 1-2 doses of PPSV23 before age 65 years should receive another dose of PPSV23 vaccine at age 65 years or later if at least 5 years have passed since the previous dose. Doses of PPSV23 are not needed for people immunized with PPSV23 at or after age 65 years.  Meningococcal vaccine. Adults with asplenia or persistent complement component deficiencies should receive 2 doses of quadrivalent meningococcal conjugate (MenACWY-D) vaccine. The doses should be obtained at least 2 months apart.  Microbiologists working with certain meningococcal bacteria, military recruits, people at risk during an outbreak, and people who travel to or live in countries with a high rate of meningitis should be immunized. A first-year college student up through age   21 years who is living in a residence hall should receive a dose if she did not receive a dose on or after her 16th birthday. Adults who have certain high-risk conditions should receive one or more doses of vaccine.  Hepatitis A vaccine. Adults who wish to be protected from this disease, have certain high-risk conditions, work with hepatitis A-infected animals, work in hepatitis A research labs, or travel to or work in countries with a high rate of hepatitis A should be immunized. Adults who were previously unvaccinated and who anticipate close contact with an international adoptee during the first 60 days after arrival in the [place] from a country with a high rate of hepatitis A should be immunized.  Hepatitis B vaccine. Adults who wish to be protected from this disease, have certain high-risk conditions, may be exposed to blood or other infectious body fluids, are household contacts or sex partners of hepatitis B positive people, are clients or workers in certain care facilities, or travel to or work in countries with a high rate of hepatitis B should be immunized.  Haemophilus influenzae type b (Hib) vaccine. A previously unvaccinated person with asplenia or sickle cell disease or having a scheduled splenectomy should receive 1 dose of Hib vaccine. Regardless of previous immunization, a recipient of a hematopoietic stem cell transplant should receive a 3-dose series 6-12 months after her successful transplant. Hib vaccine is not recommended for adults with HIV infection. Preventive Services / Frequency Ages 64 to 68 years  Blood pressure check.** / Every 1 to 2 years.  Lipid and cholesterol check.** / Every 5 years beginning at age  22.  Clinical breast exam.** / Every 3 years for women in their 88s and 53s.  BRCA-related cancer risk assessment.** / For women who have family members with a BRCA-related cancer (breast, ovarian, tubal, or peritoneal cancers).  Pap test.** / Every 2 years from ages 90 through 51. Every 3 years starting at age 21 through age 56 or 3 with a history of 3 consecutive normal Pap tests.  HPV screening.** / Every 3 years from ages 24 through ages 1 to 46 with a history of 3 consecutive normal Pap tests.  Hepatitis C blood test.** / For any individual with known risks for hepatitis C.  Skin self-exam. / Monthly.  Influenza vaccine. / Every year.  Tetanus, diphtheria, and acellular pertussis (Tdap, Td) vaccine.** / Consult your health care provider. Pregnant women should receive 1 dose of Tdap vaccine during each pregnancy. 1 dose of Td every 10 years.  Varicella vaccine.** / Consult your health care provider. Pregnant females who do not have evidence of immunity should receive the first dose after pregnancy.  HPV vaccine. / 3 doses over 6 months, if 72 and younger. The vaccine is not recommended for use in pregnant females. However, pregnancy testing is not needed before receiving a dose.  Measles, mumps, rubella (MMR) vaccine.** / You need at least 1 dose of MMR if you were born in 1957 or later. You may also need a 2nd dose. For females of childbearing age, rubella immunity should be determined. If there is no evidence of immunity, females who are not pregnant should be vaccinated. If there is no evidence of immunity, females who are pregnant should delay immunization until after pregnancy.  Pneumococcal 13-valent conjugate (PCV13) vaccine.** / Consult your health care provider.  Pneumococcal polysaccharide (PPSV23) vaccine.** / 1 to 2 doses if you smoke cigarettes or if you have certain conditions.  Meningococcal vaccine.** /  1 dose if you are age 19 to 21 years and a first-year college  student living in a residence hall, or have one of several medical conditions, you need to get vaccinated against meningococcal disease. You may also need additional booster doses.  Hepatitis A vaccine.** / Consult your health care provider.  Hepatitis B vaccine.** / Consult your health care provider.  Haemophilus influenzae type b (Hib) vaccine.** / Consult your health care provider. Ages 40 to 64 years  Blood pressure check.** / Every 1 to 2 years.  Lipid and cholesterol check.** / Every 5 years beginning at age 20 years.  Lung cancer screening. / Every year if you are aged 55-80 years and have a 30-pack-year history of smoking and currently smoke or have quit within the past 15 years. Yearly screening is stopped once you have quit smoking for at least 15 years or develop a health problem that would prevent you from having lung cancer treatment.  Clinical breast exam.** / Every year after age 40 years.  BRCA-related cancer risk assessment.** / For women who have family members with a BRCA-related cancer (breast, ovarian, tubal, or peritoneal cancers).  Mammogram.** / Every year beginning at age 40 years and continuing for as long as you are in good health. Consult with your health care provider.  Pap test.** / Every 3 years starting at age 30 years through age 65 or 70 years with a history of 3 consecutive normal Pap tests.  HPV screening.** / Every 3 years from ages 30 years through ages 65 to 70 years with a history of 3 consecutive normal Pap tests.  Fecal occult blood test (FOBT) of stool. / Every year beginning at age 50 years and continuing until age 75 years. You may not need to do this test if you get a colonoscopy every 10 years.  Flexible sigmoidoscopy or colonoscopy.** / Every 5 years for a flexible sigmoidoscopy or every 10 years for a colonoscopy beginning at age 50 years and continuing until age 75 years.  Hepatitis C blood test.** / For all people born from 1945 through  1965 and any individual with known risks for hepatitis C.  Skin self-exam. / Monthly.  Influenza vaccine. / Every year.  Tetanus, diphtheria, and acellular pertussis (Tdap/Td) vaccine.** / Consult your health care provider. Pregnant women should receive 1 dose of Tdap vaccine during each pregnancy. 1 dose of Td every 10 years.  Varicella vaccine.** / Consult your health care provider. Pregnant females who do not have evidence of immunity should receive the first dose after pregnancy.  Zoster vaccine.** / 1 dose for adults aged 60 years or older.  Measles, mumps, rubella (MMR) vaccine.** / You need at least 1 dose of MMR if you were born in 1957 or later. You may also need a 2nd dose. For females of childbearing age, rubella immunity should be determined. If there is no evidence of immunity, females who are not pregnant should be vaccinated. If there is no evidence of immunity, females who are pregnant should delay immunization until after pregnancy.  Pneumococcal 13-valent conjugate (PCV13) vaccine.** / Consult your health care provider.  Pneumococcal polysaccharide (PPSV23) vaccine.** / 1 to 2 doses if you smoke cigarettes or if you have certain conditions.  Meningococcal vaccine.** / Consult your health care provider.  Hepatitis A vaccine.** / Consult your health care provider.  Hepatitis B vaccine.** / Consult your health care provider.  Haemophilus influenzae type b (Hib) vaccine.** / Consult your health care provider. Ages 65   years and over  Blood pressure check.** / Every 1 to 2 years.  Lipid and cholesterol check.** / Every 5 years beginning at age 22 years.  Lung cancer screening. / Every year if you are aged 73-80 years and have a 30-pack-year history of smoking and currently smoke or have quit within the past 15 years. Yearly screening is stopped once you have quit smoking for at least 15 years or develop a health problem that would prevent you from having lung cancer  treatment.  Clinical breast exam.** / Every year after age 4 years.  BRCA-related cancer risk assessment.** / For women who have family members with a BRCA-related cancer (breast, ovarian, tubal, or peritoneal cancers).  Mammogram.** / Every year beginning at age 40 years and continuing for as long as you are in good health. Consult with your health care provider.  Pap test.** / Every 3 years starting at age 9 years through age 34 or 91 years with 3 consecutive normal Pap tests. Testing can be stopped between 65 and 70 years with 3 consecutive normal Pap tests and no abnormal Pap or HPV tests in the past 10 years.  HPV screening.** / Every 3 years from ages 57 years through ages 64 or 45 years with a history of 3 consecutive normal Pap tests. Testing can be stopped between 65 and 70 years with 3 consecutive normal Pap tests and no abnormal Pap or HPV tests in the past 10 years.  Fecal occult blood test (FOBT) of stool. / Every year beginning at age 15 years and continuing until age 17 years. You may not need to do this test if you get a colonoscopy every 10 years.  Flexible sigmoidoscopy or colonoscopy.** / Every 5 years for a flexible sigmoidoscopy or every 10 years for a colonoscopy beginning at age 86 years and continuing until age 71 years.  Hepatitis C blood test.** / For all people born from [birth year] through 1965 and any individual with known risks for hepatitis C.  Osteoporosis screening.** / A one-time screening for women ages 83 years and over and women at risk for fractures or osteoporosis.  Skin self-exam. / Monthly.  Influenza vaccine. / Every year.  Tetanus, diphtheria, and acellular pertussis (Tdap/Td) vaccine.** / 1 dose of Td every 10 years.  Varicella vaccine.** / Consult your health care provider.  Zoster vaccine.** / 1 dose for adults aged 61 years or older.  Pneumococcal 13-valent conjugate (PCV13) vaccine.** / Consult your health care provider.  Pneumococcal  polysaccharide (PPSV23) vaccine.** / 1 dose for all adults aged 28 years and older.  Meningococcal vaccine.** / Consult your health care provider.  Hepatitis A vaccine.** / Consult your health care provider.  Hepatitis B vaccine.** / Consult your health care provider.  Haemophilus influenzae type b (Hib) vaccine.** / Consult your health care provider. ** Family history and personal history of risk and conditions may change your health care provider's recommendations. Document Released: 10/03/2001 Document Revised: 12/22/2013 Document Reviewed: 01/02/2011 [facility] Patient Information 2015 [place], [place]. This information is not intended to replace advice given to you by your health care provider. Make sure you discuss any questions you have with your health care provider.

## 2014-04-13 NOTE — Progress Notes (Signed)
Pre visit review using our clinic review tool, if applicable. No additional management support is needed unless otherwise documented below in the visit note. 

## 2014-04-16 ENCOUNTER — Other Ambulatory Visit (INDEPENDENT_AMBULATORY_CARE_PROVIDER_SITE_OTHER): Payer: Federal, State, Local not specified - PPO

## 2014-04-16 DIAGNOSIS — E785 Hyperlipidemia, unspecified: Secondary | ICD-10-CM

## 2014-04-16 DIAGNOSIS — R8290 Unspecified abnormal findings in urine: Secondary | ICD-10-CM

## 2014-04-16 DIAGNOSIS — Z0000 Encounter for general adult medical examination without abnormal findings: Secondary | ICD-10-CM

## 2014-04-16 DIAGNOSIS — I10 Essential (primary) hypertension: Secondary | ICD-10-CM

## 2014-04-16 LAB — CBC WITH DIFFERENTIAL/PLATELET
Basophils Absolute: 0 10*3/uL (ref 0.0–0.1)
Basophils Relative: 0.6 % (ref 0.0–3.0)
Eosinophils Absolute: 0.2 10*3/uL (ref 0.0–0.7)
Eosinophils Relative: 3.4 % (ref 0.0–5.0)
HCT: 41 % (ref 36.0–46.0)
Hemoglobin: 13.5 g/dL (ref 12.0–15.0)
Lymphocytes Relative: 39.3 % (ref 12.0–46.0)
Lymphs Abs: 2.2 10*3/uL (ref 0.7–4.0)
MCHC: 33 g/dL (ref 30.0–36.0)
MCV: 95 fl (ref 78.0–100.0)
Monocytes Absolute: 0.4 10*3/uL (ref 0.1–1.0)
Monocytes Relative: 6.8 % (ref 3.0–12.0)
Neutro Abs: 2.7 10*3/uL (ref 1.4–7.7)
Neutrophils Relative %: 49.9 % (ref 43.0–77.0)
Platelets: 273 10*3/uL (ref 150.0–400.0)
RBC: 4.31 Mil/uL (ref 3.87–5.11)
RDW: 12.3 % (ref 11.5–15.5)
WBC: 5.5 10*3/uL (ref 4.0–10.5)

## 2014-04-16 LAB — POCT URINALYSIS DIPSTICK
Bilirubin, UA: NEGATIVE
Blood, UA: NEGATIVE
Glucose, UA: NEGATIVE
Ketones, UA: NEGATIVE
Nitrite, UA: NEGATIVE
Protein, UA: NEGATIVE
Spec Grav, UA: <=1.005
Urobilinogen, UA: 0.2
pH, UA: 7

## 2014-04-16 LAB — LIPID PANEL
Cholesterol: 213 mg/dL — ABNORMAL HIGH (ref 0–200)
HDL: 76.4 mg/dL (ref >39.00)
LDL Cholesterol: 122 mg/dL — ABNORMAL HIGH (ref 0–99)
NonHDL: 136.6
Total CHOL/HDL Ratio: 3
Triglycerides: 75 mg/dL (ref 0.0–149.0)
VLDL: 15 mg/dL (ref 0.0–40.0)

## 2014-04-16 LAB — BASIC METABOLIC PANEL
BUN: 9 mg/dL (ref 6–23)
CO2: 27 mEq/L (ref 19–32)
Calcium: 8.7 mg/dL (ref 8.4–10.5)
Chloride: 108 mEq/L (ref 96–112)
Creatinine, Ser: 0.6 mg/dL (ref 0.4–1.2)
GFR: 101.23 mL/min (ref >60.00)
Glucose, Bld: 86 mg/dL (ref 70–99)
Potassium: 4.1 mEq/L (ref 3.5–5.1)
Sodium: 140 mEq/L (ref 135–145)

## 2014-04-16 LAB — HEPATIC FUNCTION PANEL
ALT: 14 U/L (ref 0–35)
AST: 19 U/L (ref 0–37)
Albumin: 3.4 g/dL — ABNORMAL LOW (ref 3.5–5.2)
Alkaline Phosphatase: 102 U/L (ref 39–117)
Bilirubin, Direct: 0 mg/dL (ref 0.0–0.3)
Total Bilirubin: 0.5 mg/dL (ref 0.2–1.2)
Total Protein: 6.8 g/dL (ref 6.0–8.3)

## 2014-04-16 LAB — TSH: TSH: 1.41 u[IU]/mL (ref 0.35–4.50)

## 2014-04-18 LAB — URINE CULTURE: Colony Count: 30000

## 2014-05-07 ENCOUNTER — Other Ambulatory Visit: Payer: Self-pay

## 2014-05-07 DIAGNOSIS — I10 Essential (primary) hypertension: Secondary | ICD-10-CM

## 2014-05-07 MED ORDER — LOSARTAN POTASSIUM 100 MG PO TABS: ORAL_TABLET | ORAL | Status: DC

## 2014-05-07 MED ORDER — VERAPAMIL HCL ER 240 MG PO TBCR: EXTENDED_RELEASE_TABLET | ORAL | Status: DC

## 2015-05-17 ENCOUNTER — Encounter: Payer: Self-pay | Admitting: Family Medicine

## 2015-05-17 ENCOUNTER — Ambulatory Visit (INDEPENDENT_AMBULATORY_CARE_PROVIDER_SITE_OTHER): Payer: Federal, State, Local not specified - PPO | Admitting: Family Medicine

## 2015-05-17 VITALS — BP 129/88 | HR 71 | Temp 98.1°F | Ht 63.0 in | Wt 174.8 lb

## 2015-05-17 DIAGNOSIS — E785 Hyperlipidemia, unspecified: Secondary | ICD-10-CM | POA: Diagnosis not present

## 2015-05-17 DIAGNOSIS — I10 Essential (primary) hypertension: Secondary | ICD-10-CM

## 2015-05-17 DIAGNOSIS — R42 Dizziness and giddiness: Secondary | ICD-10-CM | POA: Insufficient documentation

## 2015-05-17 DIAGNOSIS — Z0000 Encounter for general adult medical examination without abnormal findings: Secondary | ICD-10-CM | POA: Diagnosis not present

## 2015-05-17 MED ORDER — LOSARTAN POTASSIUM 100 MG PO TABS: ORAL_TABLET | ORAL | Status: DC

## 2015-05-17 MED ORDER — VERAPAMIL HCL ER 240 MG PO TBCR: EXTENDED_RELEASE_TABLET | ORAL | Status: DC

## 2015-05-17 NOTE — Assessment & Plan Note (Signed)
Pt does not wish to do any w/u right now

## 2015-05-17 NOTE — Progress Notes (Signed)
Subjective:     Megan Murphy is a 59 y.o. female and is here for a comprehensive physical exam. The patient reports no problems.  Social History   Social History  . Marital Status: Married    Spouse Name: N/A  . Number of Children: N/A  . 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 Smoking status: Never Smoker   . Smokeless tobacco: Not on file  . Alcohol Use: 0.0 oz/week     Comment:  rarely  . Drug Use: No  . Sexual Activity: Yes   Other Topics Concern  . Not on file   Social History Narrative   Exercise-- no   Health Maintenance  Topic Date Due  . Hepatitis C Screening  11-26-1955  . HIV Screening  02/09/1971  . PAP SMEAR  10/15/2015  . INFLUENZA VACCINE  03/21/2016  . MAMMOGRAM  04/06/2017  . COLONOSCOPY  05/24/2020  . TETANUS/TDAP  01/11/2022    The following portions of the patient's history were reviewed and updated as appropriate:  She  has a past medical history of History of migraine headaches; Hyperlipidemia; Hypertension; and Basal cell cancer. She  does not have any pertinent problems on file. She  has past surgical history that includes dilation and curretage; Colonoscopy (2012); Excision basal cell carcinoma; Wisdom tooth extraction; and Cesarean section. Her family history includes Cancer in her maternal grandmother and mother; Colon polyps in her sister; Heart attack (age of onset: 28) in her brother; Hypertension in her brother and sister; Stroke (age of onset: 37) in her sister; Stroke (age of onset: 26) in her brother. There is no history of Diabetes. She  reports that she has never smoked. She does not have any smokeless tobacco history on file. She reports that she drinks alcohol. She reports that she does not use illicit drugs. She has a current medication list which includes the following prescription(s): clobetasol ointment, losartan, and verapamil. No current outpatient  prescriptions on file prior to visit.   No current facility-administered medications on file prior to visit.   She is allergic to lisinopril..  Review of Systems Review of Systems  Constitutional: Negative for activity change, appetite change and fatigue.  HENT: Negative for hearing loss, congestion, tinnitus and ear discharge.  dentist q69m Eyes: Negative for visual disturbance (see optho q1y -- vision corrected to 20/20 with glasses).  Respiratory: Negative for cough, chest tightness and shortness of breath.   Cardiovascular: Negative for chest pain, palpitations and leg swelling.  Gastrointestinal: Negative for abdominal pain, diarrhea, constipation and abdominal distention.  Genitourinary: Negative for urgency, frequency, decreased urine volume and difficulty urinating.  Musculoskeletal: Negative for back pain, arthralgias and gait problem.  Skin: Negative for color change, pallor and rash.  Neurological: Negative for dizziness, light-headedness, numbness and headaches.  Hematological: Negative for adenopathy. Does not bruise/bleed easily.  Psychiatric/Behavioral: Negative for suicidal ideas, confusion, sleep disturbance, self-injury, dysphoric mood, decreased concentration and agitation.       Objective:    BP 129/88 mmHg  Pulse 71  Temp(Src) 98.1 F (36.7 C) (Oral)  Ht 5\' 3"  (1.6 m)  Wt 174 lb 12.8 oz (79.289 kg)  BMI 30.97 kg/m2  SpO2 99% General appearance: alert, cooperative, appears stated age and no distress Head: Normocephalic, without obvious abnormality, atraumatic Eyes: conjunctivae/corneas clear. PERRL, EOM's intact. Fundi benign. Ears: normal TM's and external ear canals both ears Nose: Nares normal. Septum  midline. Mucosa normal. No drainage or sinus tenderness. Throat: lips, mucosa, and tongue normal; teeth and gums normal Neck: no adenopathy, supple, symmetrical, trachea midline and thyroid not enlarged, symmetric, no tenderness/mass/nodules Back:  symmetric, no curvature. ROM normal. No CVA tenderness. Lungs: clear to auscultation bilaterally Breasts: gyn  Heart: regular rate and rhythm, S1, S2 normal, no murmur, click, rub or gallop Abdomen: soft, non-tender; bowel sounds normal; no masses,  no organomegaly Pelvic: deferred Extremities: extremities normal, atraumatic, no cyanosis or edema Pulses: 2+ and symmetric Skin: Skin color, texture, turgor normal. No rashes or lesions Lymph nodes: Cervical, supraclavicular, and axillary nodes normal. Neurologic: Alert and oriented X 3, normal strength and tone. Normal symmetric reflexes. Normal coordination and gait Psych--no depression, no anxiety      Assessment:    Healthy female exam.       Plan:    ghm utd Check labs See After Visit Summary for Counseling Recommendations    1. Essential hypertension - CBC with Differential/Platelet - Comprehensive metabolic panel - TSH - verapamil (CALAN-SR) 240 MG CR tablet; TAKE 1 TABLET BY MOUTH ONCE DAILY  Dispense: 90 tablet; Refill: 1 - losartan (COZAAR) 100 MG tablet; TAKE 1/2 TO 1 TABLET BY MOUTH EVERY DAY TO KEEP BLOOD PRESSURE <135/85  Dispense: 90 tablet; Refill: 1  2. Hyperlipidemia  - Lipid panel  3. Preventative health care  - Hepatitis C antibody - POCT urinalysis dipstick  4. Dizziness and giddiness Pt to call or rto if symptoms return - Vitamin B12 - Vitamin D 1,25 dihydroxy

## 2015-05-17 NOTE — Patient Instructions (Signed)
Preventive Care for Adults A healthy lifestyle and preventive care can promote health and wellness. Preventive health guidelines for women include the following key practices.  A routine yearly physical is a good way to check with your health care provider about your health and preventive screening. It is a chance to share any concerns and updates on your health and to receive a thorough exam.  Visit your dentist for a routine exam and preventive care every 6 months. Brush your teeth twice a day and floss once a day. Good oral hygiene prevents tooth decay and gum disease.  The frequency of eye exams is based on your age, health, family medical history, use of contact lenses, and other factors. Follow your health care provider's recommendations for frequency of eye exams.  Eat a healthy diet. Foods like vegetables, fruits, whole grains, low-fat dairy products, and lean protein foods contain the nutrients you need without too many calories. Decrease your intake of foods high in solid fats, added sugars, and salt. Eat the right amount of calories for you.Get information about a proper diet from your health care provider, if necessary.  Regular physical exercise is one of the most important things you can do for your health. Most adults should get at least 150 minutes of moderate-intensity exercise (any activity that increases your heart rate and causes you to sweat) each week. In addition, most adults need muscle-strengthening exercises on 2 or more days a week.  Maintain a healthy weight. The body mass index (BMI) is a screening tool to identify possible weight problems. It provides an estimate of body fat based on height and weight. Your health care provider can find your BMI and can help you achieve or maintain a healthy weight.For adults 20 years and older:  A BMI below 18.5 is considered underweight.  A BMI of 18.5 to 24.9 is normal.  A BMI of 25 to 29.9 is considered overweight.  A BMI of  30 and above is considered obese.  Maintain normal blood lipids and cholesterol levels by exercising and minimizing your intake of saturated fat. Eat a balanced diet with plenty of fruit and vegetables. Blood tests for lipids and cholesterol should begin at age 76 and be repeated every 5 years. If your lipid or cholesterol levels are high, you are over 50, or you are at high risk for heart disease, you may need your cholesterol levels checked more frequently.Ongoing high lipid and cholesterol levels should be treated with medicines if diet and exercise are not working.  If you smoke, find out from your health care provider how to quit. If you do not use tobacco, do not start.  Lung cancer screening is recommended for adults aged 22-80 years who are at high risk for developing lung cancer because of a history of smoking. A yearly low-dose CT scan of the lungs is recommended for people who have at least a 30-pack-year history of smoking and are a current smoker or have quit within the past 15 years. A pack year of smoking is smoking an average of 1 pack of cigarettes a day for 1 year (for example: 1 pack a day for 30 years or 2 packs a day for 15 years). Yearly screening should continue until the smoker has stopped smoking for at least 15 years. Yearly screening should be stopped for people who develop a health problem that would prevent them from having lung cancer treatment.  If you are pregnant, do not drink alcohol. If you are breastfeeding,  be very cautious about drinking alcohol. If you are not pregnant and choose to drink alcohol, do not have more than 1 drink per day. One drink is considered to be 12 ounces (355 mL) of beer, 5 ounces (148 mL) of wine, or 1.5 ounces (44 mL) of liquor.  Avoid use of street drugs. Do not share needles with anyone. Ask for help if you need support or instructions about stopping the use of drugs.  High blood pressure causes heart disease and increases the risk of  stroke. Your blood pressure should be checked at least every 1 to 2 years. Ongoing high blood pressure should be treated with medicines if weight loss and exercise do not work.  If you are 75-52 years old, ask your health care provider if you should take aspirin to prevent strokes.  Diabetes screening involves taking a blood sample to check your fasting blood sugar level. This should be done once every 3 years, after age 15, if you are within normal weight and without risk factors for diabetes. Testing should be considered at a younger age or be carried out more frequently if you are overweight and have at least 1 risk factor for diabetes.  Breast cancer screening is essential preventive care for women. You should practice "breast self-awareness." This means understanding the normal appearance and feel of your breasts and may include breast self-examination. Any changes detected, no matter how small, should be reported to a health care provider. Women in their 58s and 30s should have a clinical breast exam (CBE) by a health care provider as part of a regular health exam every 1 to 3 years. After age 16, women should have a CBE every year. Starting at age 53, women should consider having a mammogram (breast X-ray test) every year. Women who have a family history of breast cancer should talk to their health care provider about genetic screening. Women at a high risk of breast cancer should talk to their health care providers about having an MRI and a mammogram every year.  Breast cancer gene (BRCA)-related cancer risk assessment is recommended for women who have family members with BRCA-related cancers. BRCA-related cancers include breast, ovarian, tubal, and peritoneal cancers. Having family members with these cancers may be associated with an increased risk for harmful changes (mutations) in the breast cancer genes BRCA1 and BRCA2. Results of the assessment will determine the need for genetic counseling and  BRCA1 and BRCA2 testing.  Routine pelvic exams to screen for cancer are no longer recommended for nonpregnant women who are considered low risk for cancer of the pelvic organs (ovaries, uterus, and vagina) and who do not have symptoms. Ask your health care provider if a screening pelvic exam is right for you.  If you have had past treatment for cervical cancer or a condition that could lead to cancer, you need Pap tests and screening for cancer for at least 20 years after your treatment. If Pap tests have been discontinued, your risk factors (such as having a new sexual partner) need to be reassessed to determine if screening should be resumed. Some women have medical problems that increase the chance of getting cervical cancer. In these cases, your health care provider may recommend more frequent screening and Pap tests.  The HPV test is an additional test that may be used for cervical cancer screening. The HPV test looks for the virus that can cause the cell changes on the cervix. The cells collected during the Pap test can be  tested for HPV. The HPV test could be used to screen women aged 30 years and older, and should be used in women of any age who have unclear Pap test results. After the age of 30, women should have HPV testing at the same frequency as a Pap test.  Colorectal cancer can be detected and often prevented. Most routine colorectal cancer screening begins at the age of 50 years and continues through age 75 years. However, your health care provider may recommend screening at an earlier age if you have risk factors for colon cancer. On a yearly basis, your health care provider may provide home test kits to check for hidden blood in the stool. Use of a small camera at the end of a tube, to directly examine the colon (sigmoidoscopy or colonoscopy), can detect the earliest forms of colorectal cancer. Talk to your health care provider about this at age 50, when routine screening begins. Direct  exam of the colon should be repeated every 5-10 years through age 75 years, unless early forms of pre-cancerous polyps or small growths are found.  People who are at an increased risk for hepatitis B should be screened for this virus. You are considered at high risk for hepatitis B if:  You were born in a country where hepatitis B occurs often. Talk with your health care provider about which countries are considered high risk.  Your parents were born in a high-risk country and you have not received a shot to protect against hepatitis B (hepatitis B vaccine).  You have HIV or AIDS.  You use needles to inject street drugs.  You live with, or have sex with, someone who has hepatitis B.  You get hemodialysis treatment.  You take certain medicines for conditions like cancer, organ transplantation, and autoimmune conditions.  Hepatitis C blood testing is recommended for all people born from 1945 through 1965 and any individual with known risks for hepatitis C.  Practice safe sex. Use condoms and avoid high-risk sexual practices to reduce the spread of sexually transmitted infections (STIs). STIs include gonorrhea, chlamydia, syphilis, trichomonas, herpes, HPV, and human immunodeficiency virus (HIV). Herpes, HIV, and HPV are viral illnesses that have no cure. They can result in disability, cancer, and death.  You should be screened for sexually transmitted illnesses (STIs) including gonorrhea and chlamydia if:  You are sexually active and are younger than 24 years.  You are older than 24 years and your health care provider tells you that you are at risk for this type of infection.  Your sexual activity has changed since you were last screened and you are at an increased risk for chlamydia or gonorrhea. Ask your health care provider if you are at risk.  If you are at risk of being infected with HIV, it is recommended that you take a prescription medicine daily to prevent HIV infection. This is  called preexposure prophylaxis (PrEP). You are considered at risk if:  You are a heterosexual woman, are sexually active, and are at increased risk for HIV infection.  You take drugs by injection.  You are sexually active with a partner who has HIV.  Talk with your health care provider about whether you are at high risk of being infected with HIV. If you choose to begin PrEP, you should first be tested for HIV. You should then be tested every 3 months for as long as you are taking PrEP.  Osteoporosis is a disease in which the bones lose minerals and strength   with aging. This can result in serious bone fractures or breaks. The risk of osteoporosis can be identified using a bone density scan. Women ages 65 years and over and women at risk for fractures or osteoporosis should discuss screening with their health care providers. Ask your health care provider whether you should take a calcium supplement or vitamin D to reduce the rate of osteoporosis.  Menopause can be associated with physical symptoms and risks. Hormone replacement therapy is available to decrease symptoms and risks. You should talk to your health care provider about whether hormone replacement therapy is right for you.  Use sunscreen. Apply sunscreen liberally and repeatedly throughout the day. You should seek shade when your shadow is shorter than you. Protect yourself by wearing long sleeves, pants, a wide-brimmed hat, and sunglasses year round, whenever you are outdoors.  Once a month, do a whole body skin exam, using a mirror to look at the skin on your back. Tell your health care provider of new moles, moles that have irregular borders, moles that are larger than a pencil eraser, or moles that have changed in shape or color.  Stay current with required vaccines (immunizations).  Influenza vaccine. All adults should be immunized every year.  Tetanus, diphtheria, and acellular pertussis (Td, Tdap) vaccine. Pregnant women should  receive 1 dose of Tdap vaccine during each pregnancy. The dose should be obtained regardless of the length of time since the last dose. Immunization is preferred during the 27th-36th week of gestation. An adult who has not previously received Tdap or who does not know her vaccine status should receive 1 dose of Tdap. This initial dose should be followed by tetanus and diphtheria toxoids (Td) booster doses every 10 years. Adults with an unknown or incomplete history of completing a 3-dose immunization series with Td-containing vaccines should begin or complete a primary immunization series including a Tdap dose. Adults should receive a Td booster every 10 years.  Varicella vaccine. An adult without evidence of immunity to varicella should receive 2 doses or a second dose if she has previously received 1 dose. Pregnant females who do not have evidence of immunity should receive the first dose after pregnancy. This first dose should be obtained before leaving the health care facility. The second dose should be obtained 4-8 weeks after the first dose.  Human papillomavirus (HPV) vaccine. Females aged 13-26 years who have not received the vaccine previously should obtain the 3-dose series. The vaccine is not recommended for use in pregnant females. However, pregnancy testing is not needed before receiving a dose. If a female is found to be pregnant after receiving a dose, no treatment is needed. In that case, the remaining doses should be delayed until after the pregnancy. Immunization is recommended for any person with an immunocompromised condition through the age of 26 years if she did not get any or all doses earlier. During the 3-dose series, the second dose should be obtained 4-8 weeks after the first dose. The third dose should be obtained 24 weeks after the first dose and 16 weeks after the second dose.  Zoster vaccine. One dose is recommended for adults aged 60 years or older unless certain conditions are  present.  Measles, mumps, and rubella (MMR) vaccine. Adults born before 1957 generally are considered immune to measles and mumps. Adults born in 1957 or later should have 1 or more doses of MMR vaccine unless there is a contraindication to the vaccine or there is laboratory evidence of immunity to   each of the three diseases. A routine second dose of MMR vaccine should be obtained at least 28 days after the first dose for students attending postsecondary schools, health care workers, or international travelers. People who received inactivated measles vaccine or an unknown type of measles vaccine during 1963-1967 should receive 2 doses of MMR vaccine. People who received inactivated mumps vaccine or an unknown type of mumps vaccine before 1979 and are at high risk for mumps infection should consider immunization with 2 doses of MMR vaccine. For females of childbearing age, rubella immunity should be determined. If there is no evidence of immunity, females who are not pregnant should be vaccinated. If there is no evidence of immunity, females who are pregnant should delay immunization until after pregnancy. Unvaccinated health care workers born before 1957 who lack laboratory evidence of measles, mumps, or rubella immunity or laboratory confirmation of disease should consider measles and mumps immunization with 2 doses of MMR vaccine or rubella immunization with 1 dose of MMR vaccine.  Pneumococcal 13-valent conjugate (PCV13) vaccine. When indicated, a person who is uncertain of her immunization history and has no record of immunization should receive the PCV13 vaccine. An adult aged 19 years or older who has certain medical conditions and has not been previously immunized should receive 1 dose of PCV13 vaccine. This PCV13 should be followed with a dose of pneumococcal polysaccharide (PPSV23) vaccine. The PPSV23 vaccine dose should be obtained at least 8 weeks after the dose of PCV13 vaccine. An adult aged 19  years or older who has certain medical conditions and previously received 1 or more doses of PPSV23 vaccine should receive 1 dose of PCV13. The PCV13 vaccine dose should be obtained 1 or more years after the last PPSV23 vaccine dose.  Pneumococcal polysaccharide (PPSV23) vaccine. When PCV13 is also indicated, PCV13 should be obtained first. All adults aged 65 years and older should be immunized. An adult younger than age 65 years who has certain medical conditions should be immunized. Any person who resides in a nursing home or long-term care facility should be immunized. An adult smoker should be immunized. People with an immunocompromised condition and certain other conditions should receive both PCV13 and PPSV23 vaccines. People with human immunodeficiency virus (HIV) infection should be immunized as soon as possible after diagnosis. Immunization during chemotherapy or radiation therapy should be avoided. Routine use of PPSV23 vaccine is not recommended for American Indians, [place] Natives, or people younger than 65 years unless there are medical conditions that require PPSV23 vaccine. When indicated, people who have unknown immunization and have no record of immunization should receive PPSV23 vaccine. One-time revaccination 5 years after the first dose of PPSV23 is recommended for people aged 19-64 years who have chronic kidney failure, nephrotic syndrome, asplenia, or immunocompromised conditions. People who received 1-2 doses of PPSV23 before age 65 years should receive another dose of PPSV23 vaccine at age 65 years or later if at least 5 years have passed since the previous dose. Doses of PPSV23 are not needed for people immunized with PPSV23 at or after age 65 years.  Meningococcal vaccine. Adults with asplenia or persistent complement component deficiencies should receive 2 doses of quadrivalent meningococcal conjugate (MenACWY-D) vaccine. The doses should be obtained at least 2 months apart.  Microbiologists working with certain meningococcal bacteria, military recruits, people at risk during an outbreak, and people who travel to or live in countries with a high rate of meningitis should be immunized. A first-year college student up through age   21 years who is living in a residence hall should receive a dose if she did not receive a dose on or after her 16th birthday. Adults who have certain high-risk conditions should receive one or more doses of vaccine.  Hepatitis A vaccine. Adults who wish to be protected from this disease, have certain high-risk conditions, work with hepatitis A-infected animals, work in hepatitis A research labs, or travel to or work in countries with a high rate of hepatitis A should be immunized. Adults who were previously unvaccinated and who anticipate close contact with an international adoptee during the first 60 days after arrival in the [place] from a country with a high rate of hepatitis A should be immunized.  Hepatitis B vaccine. Adults who wish to be protected from this disease, have certain high-risk conditions, may be exposed to blood or other infectious body fluids, are household contacts or sex partners of hepatitis B positive people, are clients or workers in certain care facilities, or travel to or work in countries with a high rate of hepatitis B should be immunized.  Haemophilus influenzae type b (Hib) vaccine. A previously unvaccinated person with asplenia or sickle cell disease or having a scheduled splenectomy should receive 1 dose of Hib vaccine. Regardless of previous immunization, a recipient of a hematopoietic stem cell transplant should receive a 3-dose series 6-12 months after her successful transplant. Hib vaccine is not recommended for adults with HIV infection. Preventive Services / Frequency Ages 64 to 68 years  Blood pressure check.** / Every 1 to 2 years.  Lipid and cholesterol check.** / Every 5 years beginning at age  22.  Clinical breast exam.** / Every 3 years for women in their 88s and 53s.  BRCA-related cancer risk assessment.** / For women who have family members with a BRCA-related cancer (breast, ovarian, tubal, or peritoneal cancers).  Pap test.** / Every 2 years from ages 90 through 51. Every 3 years starting at age 21 through age 56 or 3 with a history of 3 consecutive normal Pap tests.  HPV screening.** / Every 3 years from ages 24 through ages 1 to 46 with a history of 3 consecutive normal Pap tests.  Hepatitis C blood test.** / For any individual with known risks for hepatitis C.  Skin self-exam. / Monthly.  Influenza vaccine. / Every year.  Tetanus, diphtheria, and acellular pertussis (Tdap, Td) vaccine.** / Consult your health care provider. Pregnant women should receive 1 dose of Tdap vaccine during each pregnancy. 1 dose of Td every 10 years.  Varicella vaccine.** / Consult your health care provider. Pregnant females who do not have evidence of immunity should receive the first dose after pregnancy.  HPV vaccine. / 3 doses over 6 months, if 72 and younger. The vaccine is not recommended for use in pregnant females. However, pregnancy testing is not needed before receiving a dose.  Measles, mumps, rubella (MMR) vaccine.** / You need at least 1 dose of MMR if you were born in 1957 or later. You may also need a 2nd dose. For females of childbearing age, rubella immunity should be determined. If there is no evidence of immunity, females who are not pregnant should be vaccinated. If there is no evidence of immunity, females who are pregnant should delay immunization until after pregnancy.  Pneumococcal 13-valent conjugate (PCV13) vaccine.** / Consult your health care provider.  Pneumococcal polysaccharide (PPSV23) vaccine.** / 1 to 2 doses if you smoke cigarettes or if you have certain conditions.  Meningococcal vaccine.** /  1 dose if you are age 19 to 21 years and a first-year college  student living in a residence hall, or have one of several medical conditions, you need to get vaccinated against meningococcal disease. You may also need additional booster doses.  Hepatitis A vaccine.** / Consult your health care provider.  Hepatitis B vaccine.** / Consult your health care provider.  Haemophilus influenzae type b (Hib) vaccine.** / Consult your health care provider. Ages 40 to 64 years  Blood pressure check.** / Every 1 to 2 years.  Lipid and cholesterol check.** / Every 5 years beginning at age 20 years.  Lung cancer screening. / Every year if you are aged 55-80 years and have a 30-pack-year history of smoking and currently smoke or have quit within the past 15 years. Yearly screening is stopped once you have quit smoking for at least 15 years or develop a health problem that would prevent you from having lung cancer treatment.  Clinical breast exam.** / Every year after age 40 years.  BRCA-related cancer risk assessment.** / For women who have family members with a BRCA-related cancer (breast, ovarian, tubal, or peritoneal cancers).  Mammogram.** / Every year beginning at age 40 years and continuing for as long as you are in good health. Consult with your health care provider.  Pap test.** / Every 3 years starting at age 30 years through age 65 or 70 years with a history of 3 consecutive normal Pap tests.  HPV screening.** / Every 3 years from ages 30 years through ages 65 to 70 years with a history of 3 consecutive normal Pap tests.  Fecal occult blood test (FOBT) of stool. / Every year beginning at age 50 years and continuing until age 75 years. You may not need to do this test if you get a colonoscopy every 10 years.  Flexible sigmoidoscopy or colonoscopy.** / Every 5 years for a flexible sigmoidoscopy or every 10 years for a colonoscopy beginning at age 50 years and continuing until age 75 years.  Hepatitis C blood test.** / For all people born from 1945 through  1965 and any individual with known risks for hepatitis C.  Skin self-exam. / Monthly.  Influenza vaccine. / Every year.  Tetanus, diphtheria, and acellular pertussis (Tdap/Td) vaccine.** / Consult your health care provider. Pregnant women should receive 1 dose of Tdap vaccine during each pregnancy. 1 dose of Td every 10 years.  Varicella vaccine.** / Consult your health care provider. Pregnant females who do not have evidence of immunity should receive the first dose after pregnancy.  Zoster vaccine.** / 1 dose for adults aged 60 years or older.  Measles, mumps, rubella (MMR) vaccine.** / You need at least 1 dose of MMR if you were born in 1957 or later. You may also need a 2nd dose. For females of childbearing age, rubella immunity should be determined. If there is no evidence of immunity, females who are not pregnant should be vaccinated. If there is no evidence of immunity, females who are pregnant should delay immunization until after pregnancy.  Pneumococcal 13-valent conjugate (PCV13) vaccine.** / Consult your health care provider.  Pneumococcal polysaccharide (PPSV23) vaccine.** / 1 to 2 doses if you smoke cigarettes or if you have certain conditions.  Meningococcal vaccine.** / Consult your health care provider.  Hepatitis A vaccine.** / Consult your health care provider.  Hepatitis B vaccine.** / Consult your health care provider.  Haemophilus influenzae type b (Hib) vaccine.** / Consult your health care provider. Ages 65   years and over  Blood pressure check.** / Every 1 to 2 years.  Lipid and cholesterol check.** / Every 5 years beginning at age 22 years.  Lung cancer screening. / Every year if you are aged 73-80 years and have a 30-pack-year history of smoking and currently smoke or have quit within the past 15 years. Yearly screening is stopped once you have quit smoking for at least 15 years or develop a health problem that would prevent you from having lung cancer  treatment.  Clinical breast exam.** / Every year after age 4 years.  BRCA-related cancer risk assessment.** / For women who have family members with a BRCA-related cancer (breast, ovarian, tubal, or peritoneal cancers).  Mammogram.** / Every year beginning at age 40 years and continuing for as long as you are in good health. Consult with your health care provider.  Pap test.** / Every 3 years starting at age 9 years through age 34 or 91 years with 3 consecutive normal Pap tests. Testing can be stopped between 65 and 70 years with 3 consecutive normal Pap tests and no abnormal Pap or HPV tests in the past 10 years.  HPV screening.** / Every 3 years from ages 57 years through ages 64 or 45 years with a history of 3 consecutive normal Pap tests. Testing can be stopped between 65 and 70 years with 3 consecutive normal Pap tests and no abnormal Pap or HPV tests in the past 10 years.  Fecal occult blood test (FOBT) of stool. / Every year beginning at age 15 years and continuing until age 17 years. You may not need to do this test if you get a colonoscopy every 10 years.  Flexible sigmoidoscopy or colonoscopy.** / Every 5 years for a flexible sigmoidoscopy or every 10 years for a colonoscopy beginning at age 86 years and continuing until age 71 years.  Hepatitis C blood test.** / For all people born from [birth year] through 1965 and any individual with known risks for hepatitis C.  Osteoporosis screening.** / A one-time screening for women ages 83 years and over and women at risk for fractures or osteoporosis.  Skin self-exam. / Monthly.  Influenza vaccine. / Every year.  Tetanus, diphtheria, and acellular pertussis (Tdap/Td) vaccine.** / 1 dose of Td every 10 years.  Varicella vaccine.** / Consult your health care provider.  Zoster vaccine.** / 1 dose for adults aged 61 years or older.  Pneumococcal 13-valent conjugate (PCV13) vaccine.** / Consult your health care provider.  Pneumococcal  polysaccharide (PPSV23) vaccine.** / 1 dose for all adults aged 28 years and older.  Meningococcal vaccine.** / Consult your health care provider.  Hepatitis A vaccine.** / Consult your health care provider.  Hepatitis B vaccine.** / Consult your health care provider.  Haemophilus influenzae type b (Hib) vaccine.** / Consult your health care provider. ** Family history and personal history of risk and conditions may change your health care provider's recommendations. Document Released: 10/03/2001 Document Revised: 12/22/2013 Document Reviewed: 01/02/2011 [facility] Patient Information 2015 [place], [place]. This information is not intended to replace advice given to you by your health care provider. Make sure you discuss any questions you have with your health care provider.

## 2015-05-17 NOTE — Progress Notes (Deleted)
  Subjective:     Megan Murphy is a 59 y.o. female and is here for a comprehensive physical exam. The patient reports {problems:16946}.  Social History   Social History  . Marital Status: Married    Spouse Name: N/A  . Number of Children: N/A  . 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 Smoking status: Never Smoker   . Smokeless tobacco: Not on file  . Alcohol Use: 0.0 oz/week     Comment:  rarely  . Drug Use: No  . Sexual Activity: Yes   Other Topics Concern  . Not on file   Social History Narrative   Exercise-- no   Health Maintenance  Topic Date Due  . Hepatitis C Screening  11-26-1955  . HIV Screening  02/09/1971  . PAP SMEAR  10/15/2015  . INFLUENZA VACCINE  03/21/2016  . MAMMOGRAM  04/06/2017  . COLONOSCOPY  05/24/2020  . TETANUS/TDAP  01/11/2022    {Common ambulatory SmartLinks:19316}  Review of Systems {ros; complete:30496}   Objective:    {Exam, Complete:[id number]}    Assessment:    Healthy female exam. ***     Plan:     See After Visit Summary for Counseling Recommendations

## 2015-05-17 NOTE — Progress Notes (Signed)
Pre visit review using our clinic review tool, if applicable. No additional management support is needed unless otherwise documented below in the visit note. 

## 2015-05-18 LAB — CBC WITH DIFFERENTIAL/PLATELET
Basophils Absolute: 0 10*3/uL (ref 0.0–0.1)
Basophils Relative: 0.3 % (ref 0.0–3.0)
Eosinophils Absolute: 0.1 10*3/uL (ref 0.0–0.7)
Eosinophils Relative: 2.3 % (ref 0.0–5.0)
HCT: 39.4 % (ref 36.0–46.0)
Hemoglobin: 13.3 g/dL (ref 12.0–15.0)
Lymphocytes Relative: 32.2 % (ref 12.0–46.0)
Lymphs Abs: 1.9 10*3/uL (ref 0.7–4.0)
MCHC: 33.8 g/dL (ref 30.0–36.0)
MCV: 93.5 fl (ref 78.0–100.0)
Monocytes Absolute: 0.3 10*3/uL (ref 0.1–1.0)
Monocytes Relative: 5.9 % (ref 3.0–12.0)
Neutro Abs: 3.5 10*3/uL (ref 1.4–7.7)
Neutrophils Relative %: 59.3 % (ref 43.0–77.0)
Platelets: 300 10*3/uL (ref 150.0–400.0)
RBC: 4.22 Mil/uL (ref 3.87–5.11)
RDW: 12.3 % (ref 11.5–15.5)
WBC: 5.9 10*3/uL (ref 4.0–10.5)

## 2015-05-18 LAB — COMPREHENSIVE METABOLIC PANEL
ALT: 13 U/L (ref 0–35)
AST: 16 U/L (ref 0–37)
Albumin: 3.9 g/dL (ref 3.5–5.2)
Alkaline Phosphatase: 122 U/L — ABNORMAL HIGH (ref 39–117)
BUN: 11 mg/dL (ref 6–23)
CO2: 29 mEq/L (ref 19–32)
Calcium: 9.5 mg/dL (ref 8.4–10.5)
Chloride: 104 mEq/L (ref 96–112)
Creatinine, Ser: 0.69 mg/dL (ref 0.40–1.20)
GFR: 92.47 mL/min (ref >60.00)
Glucose, Bld: 83 mg/dL (ref 70–99)
Potassium: 3.9 mEq/L (ref 3.5–5.1)
Sodium: 141 mEq/L (ref 135–145)
Total Bilirubin: 0.4 mg/dL (ref 0.2–1.2)
Total Protein: 7.2 g/dL (ref 6.0–8.3)

## 2015-05-18 LAB — LIPID PANEL
Cholesterol: 221 mg/dL — ABNORMAL HIGH (ref 0–200)
HDL: 80.7 mg/dL (ref >39.00)
LDL Cholesterol: 125 mg/dL — ABNORMAL HIGH (ref 0–99)
NonHDL: 140.1
Total CHOL/HDL Ratio: 3
Triglycerides: 75 mg/dL (ref 0.0–149.0)
VLDL: 15 mg/dL (ref 0.0–40.0)

## 2015-05-18 LAB — POCT URINALYSIS DIPSTICK
Bilirubin, UA: NEGATIVE
Blood, UA: NEGATIVE
Glucose, UA: NEGATIVE
Ketones, UA: NEGATIVE
Leukocytes, UA: NEGATIVE
Nitrite, UA: NEGATIVE
Protein, UA: NEGATIVE
Spec Grav, UA: 1.02
Urobilinogen, UA: 0.2
pH, UA: 6

## 2015-05-18 LAB — VITAMIN B12: Vitamin B-12: 420 pg/mL (ref 211–911)

## 2015-05-18 LAB — HEPATITIS C ANTIBODY: HCV Ab: NEGATIVE

## 2015-05-18 LAB — TSH: TSH: 1.39 u[IU]/mL (ref 0.35–4.50)

## 2015-05-20 LAB — VITAMIN D 1,25 DIHYDROXY
Vitamin D 1, 25 (OH)2 Total: 37 pg/mL (ref 18–72)
Vitamin D2 1, 25 (OH)2: <8 pg/mL
Vitamin D3 1, 25 (OH)2: 37 pg/mL

## 2015-07-08 ENCOUNTER — Encounter: Payer: Self-pay | Admitting: Internal Medicine

## 2015-12-09 ENCOUNTER — Encounter: Payer: Self-pay | Admitting: Family Medicine

## 2015-12-09 ENCOUNTER — Ambulatory Visit (INDEPENDENT_AMBULATORY_CARE_PROVIDER_SITE_OTHER): Payer: Federal, State, Local not specified - PPO | Admitting: Family Medicine

## 2015-12-09 VITALS — BP 108/64 | HR 68 | Temp 98.8°F | Ht 63.0 in | Wt 163.4 lb

## 2015-12-09 DIAGNOSIS — W19XXXA Unspecified fall, initial encounter: Secondary | ICD-10-CM

## 2015-12-09 DIAGNOSIS — R413 Other amnesia: Secondary | ICD-10-CM

## 2015-12-09 DIAGNOSIS — I10 Essential (primary) hypertension: Secondary | ICD-10-CM | POA: Diagnosis not present

## 2015-12-09 DIAGNOSIS — R296 Repeated falls: Secondary | ICD-10-CM

## 2015-12-09 DIAGNOSIS — R55 Syncope and collapse: Secondary | ICD-10-CM | POA: Diagnosis not present

## 2015-12-09 LAB — CBC WITH DIFFERENTIAL/PLATELET
Basophils Absolute: 0 10*3/uL (ref 0.0–0.1)
Basophils Relative: 0.4 % (ref 0.0–3.0)
Eosinophils Absolute: 0.3 10*3/uL (ref 0.0–0.7)
Eosinophils Relative: 5.4 % — ABNORMAL HIGH (ref 0.0–5.0)
HCT: 40.8 % (ref 36.0–46.0)
Hemoglobin: 13.9 g/dL (ref 12.0–15.0)
Lymphocytes Relative: 33.6 % (ref 12.0–46.0)
Lymphs Abs: 2.1 10*3/uL (ref 0.7–4.0)
MCHC: 34.2 g/dL (ref 30.0–36.0)
MCV: 92.3 fl (ref 78.0–100.0)
Monocytes Absolute: 0.3 10*3/uL (ref 0.1–1.0)
Monocytes Relative: 5.5 % (ref 3.0–12.0)
Neutro Abs: 3.4 10*3/uL (ref 1.4–7.7)
Neutrophils Relative %: 55.1 % (ref 43.0–77.0)
Platelets: 290 10*3/uL (ref 150.0–400.0)
RBC: 4.43 Mil/uL (ref 3.87–5.11)
RDW: 12.7 % (ref 11.5–15.5)
WBC: 6.1 10*3/uL (ref 4.0–10.5)

## 2015-12-09 LAB — COMPREHENSIVE METABOLIC PANEL
ALT: 12 U/L (ref 0–35)
AST: 13 U/L (ref 0–37)
Albumin: 4 g/dL (ref 3.5–5.2)
Alkaline Phosphatase: 87 U/L (ref 39–117)
BUN: 14 mg/dL (ref 6–23)
CO2: 29 mEq/L (ref 19–32)
Calcium: 9.4 mg/dL (ref 8.4–10.5)
Chloride: 101 mEq/L (ref 96–112)
Creatinine, Ser: 0.65 mg/dL (ref 0.40–1.20)
GFR: 98.88 mL/min (ref >60.00)
Glucose, Bld: 87 mg/dL (ref 70–99)
Potassium: 3.7 mEq/L (ref 3.5–5.1)
Sodium: 137 mEq/L (ref 135–145)
Total Bilirubin: 0.4 mg/dL (ref 0.2–1.2)
Total Protein: 7.3 g/dL (ref 6.0–8.3)

## 2015-12-09 LAB — POCT URINALYSIS DIPSTICK
Bilirubin, UA: NEGATIVE
Blood, UA: NEGATIVE
Glucose, UA: NEGATIVE
Ketones, UA: NEGATIVE
Leukocytes, UA: NEGATIVE
Nitrite, UA: NEGATIVE
Protein, UA: NEGATIVE
Spec Grav, UA: 1.015
Urobilinogen, UA: 0.2
pH, UA: 6

## 2015-12-09 LAB — LIPID PANEL
Cholesterol: 238 mg/dL — ABNORMAL HIGH (ref 0–200)
HDL: 74.3 mg/dL (ref >39.00)
LDL Cholesterol: 148 mg/dL — ABNORMAL HIGH (ref 0–99)
NonHDL: 163.22
Total CHOL/HDL Ratio: 3
Triglycerides: 78 mg/dL (ref 0.0–149.0)
VLDL: 15.6 mg/dL (ref 0.0–40.0)

## 2015-12-09 LAB — TSH: TSH: 1.2 u[IU]/mL (ref 0.35–4.50)

## 2015-12-09 LAB — VITAMIN D 25 HYDROXY (VIT D DEFICIENCY, FRACTURES): VITD: 26.4 ng/mL — ABNORMAL LOW (ref 30.00–100.00)

## 2015-12-09 LAB — VITAMIN B12: Vitamin B-12: 345 pg/mL (ref 211–911)

## 2015-12-09 MED ORDER — VERAPAMIL HCL ER 180 MG PO TBCR: 180.0000 mg | EXTENDED_RELEASE_TABLET | Freq: Every day | ORAL | Status: DC

## 2015-12-09 NOTE — Progress Notes (Signed)
Patient ID: Megan Murphy, female    DOB: [birth year]  Age: 60 y.o. MRN: [id number]    Subjective:  Subjective HPI Megan Murphy presents for f/u bp ---  She has been falling a lot 4-5 x since Christmas and had at least one episode of ? Passing out. No witnesses.  She went to [facility] / ER for them.  When she fell at Christmas she broke her hand.  Pt denies CP, palp, sob.  She feels the bp meds make her lose her memory.    Review of Systems  Constitutional: Negative for diaphoresis, appetite change, fatigue and unexpected weight change.  Eyes: Negative for pain, redness and visual disturbance.  Respiratory: Negative for cough, chest tightness, shortness of breath and wheezing.   Cardiovascular: Negative for chest pain, palpitations and leg swelling.  Endocrine: Negative for cold intolerance, heat intolerance, polydipsia, polyphagia and polyuria.  Genitourinary: Negative for dysuria, frequency and difficulty urinating.  Neurological: Negative for dizziness, light-headedness, numbness and headaches.    History Past Medical History  Diagnosis Date  . History of migraine headaches   . Hyperlipidemia   . Hypertension   . Basal cell cancer      X 2;Dr Tonia Brooms    She has past surgical history that includes dilation and curretage; Colonoscopy (2012); Excision basal cell carcinoma; Wisdom tooth extraction; and Cesarean section.   Her family history includes Cancer in her maternal grandmother and mother; Colon polyps in her sister; Heart attack (age of onset: 57) in her brother; Hypertension in her brother and sister; Stroke (age of onset: 33) in her sister; Stroke (age of onset: 70) in her brother. There is no history of Diabetes.She reports that she has never smoked. She does not have any smokeless tobacco history on file. She reports that she drinks alcohol. She reports that she does not use illicit drugs.  Current Outpatient Prescriptions on File Prior to Visit  Medication Sig Dispense  Refill  . clobetasol ointment (TEMOVATE) 0.05 %     . losartan (COZAAR) 100 MG tablet TAKE 1/2 TO 1 TABLET BY MOUTH EVERY DAY TO KEEP BLOOD PRESSURE <135/85 90 tablet 1   No current facility-administered medications on file prior to visit.     Objective:  Objective Physical Exam  Constitutional: She is oriented to person, place, and time. She appears well-developed and well-nourished.  HENT:  Head: Normocephalic and atraumatic.  Eyes: Conjunctivae and EOM are normal.  Neck: Normal range of motion. Neck supple. No JVD present. Carotid bruit is not present. No thyromegaly present.  Cardiovascular: Normal rate, regular rhythm and normal heart sounds.   No murmur heard. Pulmonary/Chest: Effort normal and breath sounds normal. No respiratory distress. She has no wheezes. She has no rales. She exhibits no tenderness.  Musculoskeletal: She exhibits no edema.  Neurological: She is alert and oriented to person, place, and time.  Psychiatric: She has a normal mood and affect. Her behavior is normal. Judgment and thought content normal.  Nursing note and vitals reviewed.  BP 108/64 mmHg  Pulse 68  Temp(Src) 98.8 F (37.1 C) (Oral)  Ht 5\' 3"  (1.6 m)  Wt 163 lb 6.4 oz (74.118 kg)  BMI 28.95 kg/m2  SpO2 97% Wt Readings from Last 3 Encounters:  12/09/15 163 lb 6.4 oz (74.118 kg)  05/17/15 174 lb 12.8 oz (79.289 kg)  04/13/14 172 lb (78.019 kg)     Lab Results  Component Value Date   WBC 5.9 05/17/2015   HGB 13.3 05/17/2015  HCT 39.4 05/17/2015   PLT 300.0 05/17/2015   GLUCOSE 83 05/17/2015   CHOL 221* 05/17/2015   TRIG 75.0 05/17/2015   HDL 80.70 05/17/2015   LDLDIRECT 134.3 03/13/2012   LDLCALC 125* 05/17/2015   ALT 13 05/17/2015   AST 16 05/17/2015   NA 141 05/17/2015   K 3.9 05/17/2015   CL 104 05/17/2015   CREATININE 0.69 05/17/2015   BUN 11 05/17/2015   CO2 29 05/17/2015   TSH 1.39 05/17/2015   HGBA1C 5.5 03/07/2011    No results found.   Assessment & Plan:    Plan I have discontinued Ms. Lynam's verapamil. I am also having her start on verapamil. Additionally, I am having her maintain her clobetasol ointment and losartan.  Meds ordered this encounter  Medications  . verapamil (CALAN-SR) 180 MG CR tablet    Sig: Take 1 tablet (180 mg total) by mouth at bedtime.    Dispense:  30 tablet    Refill:  2    Problem List Items Addressed This Visit    None    Visit Diagnoses    Essential hypertension    -  Primary    Relevant Medications    verapamil (CALAN-SR) 180 MG CR tablet    Other Relevant Orders    Comprehensive metabolic panel    CBC with Differential/Platelet    Lipid panel    POCT urinalysis dipstick (Completed)    TSH    Vitamin B12    Vitamin D (25 hydroxy)    Falling episodes        Relevant Orders    Comprehensive metabolic panel    CBC with Differential/Platelet    Lipid panel    POCT urinalysis dipstick (Completed)    TSH    Vitamin B12    Vitamin D (25 hydroxy)    MR Brain Wo Contrast    EKG 12-Lead (Completed)    Ambulatory referral to Neurology    Memory loss        Relevant Orders    Comprehensive metabolic panel    CBC with Differential/Platelet    Lipid panel    POCT urinalysis dipstick (Completed)    TSH    Vitamin B12    Vitamin D (25 hydroxy)    MR Brain Wo Contrast    EKG 12-Lead (Completed)    Ambulatory referral to Neurology    Syncope, unspecified syncope type        Relevant Medications    verapamil (CALAN-SR) 180 MG CR tablet    Other Relevant Orders    MR Brain Wo Contrast    EKG 12-Lead (Completed)    Ambulatory referral to Neurology    US Carotid Duplex Bilateral     pt instructed to go to ER if occurs again  Follow-up: Return in about 2 weeks (around 12/23/2015), or if symptoms worsen or fail to improve, for hypertension.  Ann Held, DO

## 2015-12-09 NOTE — Patient Instructions (Signed)
Fall Prevention in the Home  Falls can cause injuries and can affect people from all age groups. There are many simple things that you can do to make your home safe and to help prevent falls. WHAT CAN I DO ON THE OUTSIDE OF MY HOME?  Regularly repair the edges of walkways and driveways and fix any cracks.  Remove high doorway thresholds.  Trim any shrubbery on the main path into your home.  Use bright outdoor lighting.  Clear walkways of debris and clutter, including tools and rocks.  Regularly check that handrails are securely fastened and in good repair. Both sides of any steps should have handrails.  Install guardrails along the edges of any raised decks or porches.  Have leaves, snow, and ice cleared regularly.  Use sand or salt on walkways during winter months.  In the garage, clean up any spills right away, including grease or oil spills. WHAT CAN I DO IN THE BATHROOM?  Use night lights.  Install grab bars by the toilet and in the tub and shower. Do not use towel bars as grab bars.  Use non-skid mats or decals on the floor of the tub or shower.  If you need to sit down while you are in the shower, use a plastic, non-slip stool..  Keep the floor dry. Immediately clean up any water that spills on the floor.  Remove soap buildup in the tub or shower on a regular basis.  Attach bath mats securely with double-sided non-slip rug tape.  Remove throw rugs and other tripping hazards from the floor. WHAT CAN I DO IN THE BEDROOM?  Use night lights.  Make sure that a bedside light is easy to reach.  Do not use oversized bedding that drapes onto the floor.  Have a firm chair that has side arms to use for getting dressed.  Remove throw rugs and other tripping hazards from the floor. WHAT CAN I DO IN THE KITCHEN?   Clean up any spills right away.  Avoid walking on wet floors.  Place frequently used items in easy-to-reach places.  If you need to reach for something  above you, use a sturdy step stool that has a grab bar.  Keep electrical cables out of the way.  Do not use floor polish or wax that makes floors slippery. If you have to use wax, make sure that it is non-skid floor wax.  Remove throw rugs and other tripping hazards from the floor. WHAT CAN I DO IN THE STAIRWAYS?  Do not leave any items on the stairs.  Make sure that there are handrails on both sides of the stairs. Fix handrails that are broken or loose. Make sure that handrails are as long as the stairways.  Check any carpeting to make sure that it is firmly attached to the stairs. Fix any carpet that is loose or worn.  Avoid having throw rugs at the top or bottom of stairways, or secure the rugs with carpet tape to prevent them from moving.  Make sure that you have a light switch at the top of the stairs and the bottom of the stairs. If you do not have them, have them installed. WHAT ARE SOME OTHER FALL PREVENTION TIPS?  Wear closed-toe shoes that fit well and support your feet. Wear shoes that have rubber soles or low heels.  When you use a stepladder, make sure that it is completely opened and that the sides are firmly locked. Have someone hold the ladder while you   are using it. Do not climb a closed stepladder.  Add color or contrast paint or tape to grab bars and handrails in your home. Place contrasting color strips on the first and last steps.  Use mobility aids as needed, such as canes, walkers, scooters, and crutches.  Turn on lights if it is dark. Replace any light bulbs that burn out.  Set up furniture so that there are clear paths. Keep the furniture in the same spot.  Fix any uneven floor surfaces.  Choose a carpet design that does not hide the edge of steps of a stairway.  Be aware of any and all pets.  Review your medicines with your healthcare provider. Some medicines can cause dizziness or changes in blood pressure, which increase your risk of falling. Talk  with your health care provider about other ways that you can decrease your risk of falls. This may include working with a physical therapist or trainer to improve your strength, balance, and endurance.   This information is not intended to replace advice given to you by your health care provider. Make sure you discuss any questions you have with your health care provider.   Document Released: 07/28/2002 Document Revised: 12/22/2014 Document Reviewed: 09/11/2014 Elsevier Interactive Patient Education 2016 [employer]  

## 2015-12-09 NOTE — Progress Notes (Signed)
Pre visit review using our clinic review tool, if applicable. No additional management support is needed unless otherwise documented below in the visit note. 

## 2015-12-16 ENCOUNTER — Other Ambulatory Visit: Payer: Self-pay

## 2015-12-16 MED ORDER — VITAMIN D (ERGOCALCIFEROL) 1.25 MG (50000 UNIT) PO CAPS: 50000.0000 [IU] | ORAL_CAPSULE | ORAL | Status: DC

## 2015-12-20 DIAGNOSIS — H401132 Primary open-angle glaucoma, bilateral, moderate stage: Secondary | ICD-10-CM | POA: Diagnosis not present

## 2015-12-25 ENCOUNTER — Ambulatory Visit (HOSPITAL_BASED_OUTPATIENT_CLINIC_OR_DEPARTMENT_OTHER)
Admission: RE | Admit: 2015-12-25 | Discharge: 2015-12-25 | Disposition: A | Discharge status: Home / Self Care | Payer: Federal, State, Local not specified - PPO | Source: Ambulatory Visit | Attending: Family Medicine | Admitting: Family Medicine

## 2015-12-25 DIAGNOSIS — R413 Other amnesia: Secondary | ICD-10-CM | POA: Diagnosis not present

## 2015-12-25 DIAGNOSIS — W19XXXA Unspecified fall, initial encounter: Secondary | ICD-10-CM | POA: Diagnosis not present

## 2015-12-25 DIAGNOSIS — R55 Syncope and collapse: Secondary | ICD-10-CM

## 2015-12-25 DIAGNOSIS — R296 Repeated falls: Secondary | ICD-10-CM | POA: Diagnosis not present

## 2016-01-07 ENCOUNTER — Ambulatory Visit: Payer: Federal, State, Local not specified - PPO | Admitting: Neurology

## 2016-01-19 DIAGNOSIS — H401132 Primary open-angle glaucoma, bilateral, moderate stage: Secondary | ICD-10-CM | POA: Diagnosis not present

## 2016-04-03 ENCOUNTER — Encounter: Payer: Self-pay | Admitting: Podiatry

## 2016-04-03 ENCOUNTER — Ambulatory Visit (INDEPENDENT_AMBULATORY_CARE_PROVIDER_SITE_OTHER): Payer: Federal, State, Local not specified - PPO | Admitting: Podiatry

## 2016-04-03 DIAGNOSIS — L603 Nail dystrophy: Secondary | ICD-10-CM | POA: Diagnosis not present

## 2016-04-03 DIAGNOSIS — B351 Tinea unguium: Secondary | ICD-10-CM | POA: Diagnosis not present

## 2016-04-03 NOTE — Progress Notes (Signed)
   Subjective:    Patient ID: Megan Murphy, female    DOB: 08-01-1956, 60 y.o.   MRN: [id number]  HPI  60 year old female presents the also for concerns of toenail fungus to both of her big toes as well as her fifth toes. She states that at times the left big toenails painful extending hits it however she is currently not having pain. She denies any swelling or redness or any drainage. She previously was on [place]lac about 20 years ago and she has not noticed much result without. Since then she has had no other treatment. No other complaints at this time.   Review of Systems  All other systems reviewed and are negative.      Objective:   Physical Exam General: AAO x3, NAD  Dermatological: Bilateral hallux and fifth digit toenails are hypertrophic, dystrophic, brittle, discolored. There is no tenderness palpation of the nails or his no swelling or redness, drainage or swelling or any signs of infection. No open lesions or pre-ulcer lesions at this time.  Vascular: Dorsalis Pedis artery and Posterior Tibial artery pedal pulses are 2/4 bilateral with immedate capillary fill time. Pedal hair growth present.  There is no pain with calf compression, swelling, warmth, erythema.   Neruologic: Grossly intact via light touch bilateral. Vibratory intact via tuning fork bilateral. Protective threshold with Semmes Wienstein monofilament intact to all pedal sites bilateral.   Musculoskeletal: No gross boney pedal deformities bilateral. No pain, crepitus, or limitation noted with foot and ankle range of motion bilateral. Muscular strength 5/5 in all groups tested bilateral.  Gait: Unassisted, Nonantalgic.     Assessment & Plan:  60 year old female onychodystrophy likely onychomycosis -Treatment options discussed including all alternatives, risks, and complications -Etiology of symptoms were discussed -At this time I recommended biopsy of the toenail. The symptomatic nails were debrided today without  complications or bleeding and these were sent to [facility] for cultures/pathology. I discussed treatment options which would likely include oral therapy at this point however we  will await the results of the culture before proceeding with definitive treatment. She Greeson this.  Celesta Gentile, DPM

## 2016-04-06 DIAGNOSIS — K080 Exfoliation of teeth due to systemic causes: Secondary | ICD-10-CM | POA: Diagnosis not present

## 2016-04-06 NOTE — Addendum Note (Signed)
Addended by: Harriett Sine D on: 04/06/2016 12:49 PM   Modules accepted: Orders

## 2016-04-08 DIAGNOSIS — K8000 Calculus of gallbladder with acute cholecystitis without obstruction: Secondary | ICD-10-CM | POA: Diagnosis not present

## 2016-04-10 ENCOUNTER — Ambulatory Visit: Payer: Self-pay | Admitting: Podiatry

## 2016-04-10 DIAGNOSIS — Z85828 Personal history of other malignant neoplasm of skin: Secondary | ICD-10-CM | POA: Diagnosis not present

## 2016-04-10 DIAGNOSIS — L821 Other seborrheic keratosis: Secondary | ICD-10-CM | POA: Diagnosis not present

## 2016-04-10 DIAGNOSIS — Z86018 Personal history of other benign neoplasm: Secondary | ICD-10-CM | POA: Diagnosis not present

## 2016-04-10 DIAGNOSIS — L820 Inflamed seborrheic keratosis: Secondary | ICD-10-CM | POA: Diagnosis not present

## 2016-04-10 DIAGNOSIS — L814 Other melanin hyperpigmentation: Secondary | ICD-10-CM | POA: Diagnosis not present

## 2016-04-10 DIAGNOSIS — D225 Melanocytic nevi of trunk: Secondary | ICD-10-CM | POA: Diagnosis not present

## 2016-04-10 DIAGNOSIS — L570 Actinic keratosis: Secondary | ICD-10-CM | POA: Diagnosis not present

## 2016-04-10 DIAGNOSIS — D485 Neoplasm of uncertain behavior of skin: Secondary | ICD-10-CM | POA: Diagnosis not present

## 2016-05-31 DIAGNOSIS — H401132 Primary open-angle glaucoma, bilateral, moderate stage: Secondary | ICD-10-CM | POA: Diagnosis not present

## 2016-07-07 ENCOUNTER — Other Ambulatory Visit: Payer: Self-pay | Admitting: Family Medicine

## 2016-07-07 DIAGNOSIS — I10 Essential (primary) hypertension: Secondary | ICD-10-CM

## 2016-09-13 ENCOUNTER — Other Ambulatory Visit: Payer: Self-pay | Admitting: Family Medicine

## 2016-09-13 DIAGNOSIS — I10 Essential (primary) hypertension: Secondary | ICD-10-CM

## 2016-09-26 DIAGNOSIS — R110 Nausea: Secondary | ICD-10-CM | POA: Diagnosis not present

## 2016-09-26 DIAGNOSIS — J1189 Influenza due to unidentified influenza virus with other manifestations: Secondary | ICD-10-CM | POA: Diagnosis not present

## 2016-10-02 DIAGNOSIS — K080 Exfoliation of teeth due to systemic causes: Secondary | ICD-10-CM | POA: Diagnosis not present

## 2016-10-03 ENCOUNTER — Encounter: Payer: Self-pay | Admitting: Family Medicine

## 2016-10-03 ENCOUNTER — Ambulatory Visit (INDEPENDENT_AMBULATORY_CARE_PROVIDER_SITE_OTHER): Payer: Federal, State, Local not specified - PPO | Admitting: Family Medicine

## 2016-10-03 VITALS — BP 118/78 | HR 88 | Temp 98.2°F | Resp 16 | Ht 63.0 in | Wt 156.4 lb

## 2016-10-03 DIAGNOSIS — I10 Essential (primary) hypertension: Secondary | ICD-10-CM | POA: Diagnosis not present

## 2016-10-03 DIAGNOSIS — R21 Rash and other nonspecific skin eruption: Secondary | ICD-10-CM

## 2016-10-03 DIAGNOSIS — E559 Vitamin D deficiency, unspecified: Secondary | ICD-10-CM | POA: Diagnosis not present

## 2016-10-03 MED ORDER — TRIAMCINOLONE ACETONIDE 0.1 % EX CREA: 1.0000 "application " | TOPICAL_CREAM | Freq: Two times a day (BID) | CUTANEOUS | 0 refills | Status: DC

## 2016-10-03 MED ORDER — VERAPAMIL HCL ER 180 MG PO TBCR: EXTENDED_RELEASE_TABLET | ORAL | 2 refills | Status: DC

## 2016-10-03 NOTE — Progress Notes (Signed)
Subjective:    Patient ID: Megan Murphy, female    DOB: 07-15-1956, 61 y.o.   MRN: [id number]  Chief Complaint  Patient presents with  . Follow-up  . Hypertension  . vitamin d level recheck    only took 1 month of the vit d.    HPI Patient is in today for follow up for hypertension.  She would like to recheck vitamin d today.  She only took one month of vitamin d because she did not know about any refills.  Past Medical History:  Diagnosis Date  . Basal cell cancer     X 2;Dr Tonia Brooms  . History of migraine headaches   . Hyperlipidemia   . Hypertension     Past Surgical History:  Procedure Laterality Date  . BASAL CELL CARCINOMA EXCISION      X 2  . CESAREAN SECTION      X 2; G 2 P 2  . COLONOSCOPY  2012   Dr Olevia Perches, negative  . dilation and curretage     Dr Dellis Filbert  . WISDOM TOOTH EXTRACTION      Family History  Problem Relation Age of Onset  . Cancer Mother     ovarian cancer  . Stroke Sister 13  . Colon polyps Sister   . Hypertension Sister   . Stroke Brother 71  . Hypertension Brother     X 3  . Cancer Maternal Grandmother     bladder cancer  . Diabetes Neg Hx   . Heart attack Brother 66    Social History   Social History  . Marital status: Married    Spouse name: N/A  . Number of children: N/A  . 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 Smoking status: Never Smoker  . Smokeless tobacco: Never Used  . Alcohol use 0.0 oz/week     Comment:  rarely  . Drug use: No  . Sexual activity: Yes   Other Topics Concern  . Not on file   Social History Narrative   Exercise-- no    Outpatient Medications Prior to Visit  Medication Sig Dispense Refill  . clobetasol ointment (TEMOVATE) 0.05 %     . losartan (COZAAR) 100 MG tablet TAKE 1/2 TO 1 TABLET BY MOUTH EVERY DAY TO KEEP BLOOD PRESSURE <135/85 90 tablet 1  . verapamil (CALAN-SR) 180 MG CR tablet TAKE 1 TABLET BY  MOUTH ATBEDTIME. 30 tablet 0  . Vitamin D, Ergocalciferol, (DRISDOL) 50000 units CAPS capsule Take 1 capsule (50,000 Units total) by mouth every 7 (seven) days. (Patient not taking: Reported on 10/03/2016) 4 capsule 2   No facility-administered medications prior to visit.     Allergies  Allergen Reactions  . Lisinopril     REACTION: cough    Review of Systems  Constitutional: Negative for chills, fever and malaise/fatigue.  HENT: Negative for congestion and hearing loss.   Eyes: Negative for discharge.  Respiratory: Negative for cough, sputum production and shortness of breath.   Cardiovascular: Negative for chest pain, palpitations and leg swelling.  Gastrointestinal: Negative for abdominal pain, blood in stool, constipation, diarrhea, heartburn, nausea and vomiting.  Genitourinary: Negative for dysuria, frequency, hematuria and urgency.  Musculoskeletal: Negative for back pain, falls and myalgias.  Skin: Negative for rash.  Neurological: Negative for dizziness, sensory change, loss of consciousness, weakness and headaches.  Endo/Heme/Allergies: Negative for environmental allergies. Does not bruise/bleed  easily.  Psychiatric/Behavioral: Negative for depression and suicidal ideas. The patient is not nervous/anxious and does not have insomnia.        Objective:    Physical Exam  Constitutional: She is oriented to person, place, and time. She appears well-developed and well-nourished.  HENT:  Head: Normocephalic and atraumatic.  Eyes: Conjunctivae and EOM are normal.  Neck: Normal range of motion. Neck supple. No JVD present. Carotid bruit is not present. No thyromegaly present.  Cardiovascular: Normal rate, regular rhythm and normal heart sounds.   No murmur heard. Pulmonary/Chest: Effort normal and breath sounds normal. No respiratory distress. She has no wheezes. She has no rales. She exhibits no tenderness.  Musculoskeletal: She exhibits no edema.  Neurological: She is alert  and oriented to person, place, and time.  Psychiatric: She has a normal mood and affect.  Nursing note and vitals reviewed.   BP 118/78 (BP Location: Right Arm, Cuff Size: Normal)   Pulse 88   Temp 98.2 F (36.8 C) (Oral)   Resp 16   Ht 5\' 3"  (1.6 m)   Wt 156 lb 6.4 oz (70.9 kg)   SpO2 96%   BMI 27.71 kg/m  Wt Readings from Last 3 Encounters:  10/03/16 156 lb 6.4 oz (70.9 kg)  12/09/15 163 lb 6.4 oz (74.1 kg)  05/17/15 174 lb 12.8 oz (79.3 kg)     Lab Results  Component Value Date   WBC 7.3 10/03/2016   HGB 14.3 10/03/2016   HCT 42.5 10/03/2016   PLT 315.0 10/03/2016   GLUCOSE 91 10/03/2016   CHOL 243 (H) 10/03/2016   TRIG 102.0 10/03/2016   HDL 80.20 10/03/2016   LDLDIRECT 134.3 03/13/2012   LDLCALC 143 (H) 10/03/2016   ALT 10 10/03/2016   AST 15 10/03/2016   NA 139 10/03/2016   K 4.4 10/03/2016   CL 102 10/03/2016   CREATININE 0.80 10/03/2016   BUN 14 10/03/2016   CO2 31 10/03/2016   TSH 1.20 12/09/2015   HGBA1C 5.5 03/07/2011    Lab Results  Component Value Date   TSH 1.20 12/09/2015   Lab Results  Component Value Date   WBC 7.3 10/03/2016   HGB 14.3 10/03/2016   HCT 42.5 10/03/2016   MCV 93.8 10/03/2016   PLT 315.0 10/03/2016   Lab Results  Component Value Date   NA 139 10/03/2016   K 4.4 10/03/2016   CO2 31 10/03/2016   GLUCOSE 91 10/03/2016   BUN 14 10/03/2016   CREATININE 0.80 10/03/2016   BILITOT 0.3 10/03/2016   ALKPHOS 91 10/03/2016   AST 15 10/03/2016   ALT 10 10/03/2016   PROT 7.2 10/03/2016   ALBUMIN 4.0 10/03/2016   CALCIUM 9.5 10/03/2016   GFR 77.59 10/03/2016   Lab Results  Component Value Date   CHOL 243 (H) 10/03/2016   Lab Results  Component Value Date   HDL 80.20 10/03/2016   Lab Results  Component Value Date   LDLCALC 143 (H) 10/03/2016   Lab Results  Component Value Date   TRIG 102.0 10/03/2016   Lab Results  Component Value Date   CHOLHDL 3 10/03/2016   Lab Results  Component Value Date   HGBA1C  5.5 03/07/2011       Assessment & Plan:   Problem List Items Addressed This Visit    None    Visit Diagnoses    Vitamin D deficiency    -  Primary   Relevant Orders   CBC with Differential/Platelet (Completed)  Lipid panel (Completed)   Comprehensive metabolic panel (Completed)   Vitamin D 1,25 dihydroxy (Completed)   Essential hypertension       Relevant Medications   verapamil (CALAN-SR) 180 MG CR tablet   Other Relevant Orders   CBC with Differential/Platelet (Completed)   Lipid panel (Completed)   Comprehensive metabolic panel (Completed)   Rash       Relevant Medications   triamcinolone cream (KENALOG) 0.1 %      I have discontinued Ms. Dovidio's losartan. I have also changed her verapamil. Additionally, I am having her start on triamcinolone cream. Lastly, I am having her maintain her clobetasol ointment, Vitamin D (Ergocalciferol), and TRAVATAN Z.  Meds ordered this encounter  Medications  . TRAVATAN Z 0.004 % SOLN ophthalmic solution  . verapamil (CALAN-SR) 180 MG CR tablet    Sig: TAKE 1/2 TABLET BY MOUTH ATBEDTIME.    Dispense:  30 tablet    Refill:  2  . triamcinolone cream (KENALOG) 0.1 %    Sig: Apply 1 application topically 2 (two) times daily.    Dispense:  30 g    Refill:  0    CMA served as scribe during this visit. History, Physical and Plan performed by medical provider. Documentation and orders reviewed and attested to.  Ann Held, DO

## 2016-10-03 NOTE — Patient Instructions (Signed)
Hypertension Hypertension, commonly called high blood pressure, is when the force of blood pumping through your arteries is too strong. Your arteries are the blood vessels that carry blood from your heart throughout your body. A blood pressure reading consists of a higher number over a lower number, such as 110/72. The higher number (systolic) is the pressure inside your arteries when your heart pumps. The lower number (diastolic) is the pressure inside your arteries when your heart relaxes. Ideally you want your blood pressure below 120/80. Hypertension forces your heart to work harder to pump blood. Your arteries may become narrow or stiff. Having untreated or uncontrolled hypertension can cause heart attack, stroke, kidney disease, and other problems. What increases the risk? Some risk factors for high blood pressure are controllable. Others are not. Risk factors you cannot control include:  Race. You may be at higher risk if you are African American.  Age. Risk increases with age.  Gender. Men are at higher risk than women before age 45 years. After age 65, women are at higher risk than men. Risk factors you can control include:  Not getting enough exercise or physical activity.  Being overweight.  Getting too much fat, sugar, calories, or salt in your diet.  Drinking too much alcohol. What are the signs or symptoms? Hypertension does not usually cause signs or symptoms. Extremely high blood pressure (hypertensive crisis) may cause headache, anxiety, shortness of breath, and nosebleed. How is this diagnosed? To check if you have hypertension, your health care provider will measure your blood pressure while you are seated, with your arm held at the level of your heart. It should be measured at least twice using the same arm. Certain conditions can cause a difference in blood pressure between your right and left arms. A blood pressure reading that is higher than normal on one occasion does  not mean that you need treatment. If it is not clear whether you have high blood pressure, you may be asked to return on a different day to have your blood pressure checked again. Or, you may be asked to monitor your blood pressure at home for 1 or more weeks. How is this treated? Treating high blood pressure includes making lifestyle changes and possibly taking medicine. Living a healthy lifestyle can help lower high blood pressure. You may need to change some of your habits. Lifestyle changes may include:  Following the DASH diet. This diet is high in fruits, vegetables, and whole grains. It is low in salt, red meat, and added sugars.  Keep your sodium intake below 2,300 mg per day.  Getting at least 30-45 minutes of aerobic exercise at least 4 times per week.  Losing weight if necessary.  Not smoking.  Limiting alcoholic beverages.  Learning ways to reduce stress. Your health care provider may prescribe medicine if lifestyle changes are not enough to get your blood pressure under control, and if one of the following is true:  You are 18-59 years of age and your systolic blood pressure is above 140.  You are 60 years of age or older, and your systolic blood pressure is above 150.  Your diastolic blood pressure is above 90.  You have diabetes, and your systolic blood pressure is over 140 or your diastolic blood pressure is over 90.  You have kidney disease and your blood pressure is above 140/90.  You have heart disease and your blood pressure is above 140/90. Your personal target blood pressure may vary depending on your medical   conditions, your age, and other factors. Follow these instructions at home:  Have your blood pressure rechecked as directed by your health care provider.  Take medicines only as directed by your health care provider. Follow the directions carefully. Blood pressure medicines must be taken as prescribed. The medicine does not work as well when you skip  doses. Skipping doses also puts you at risk for problems.  Do not smoke.  Monitor your blood pressure at home as directed by your health care provider. Contact a health care provider if:  You think you are having a reaction to medicines taken.  You have recurrent headaches or feel dizzy.  You have swelling in your ankles.  You have trouble with your vision. Get help right away if:  You develop a severe headache or confusion.  You have unusual weakness, numbness, or feel faint.  You have severe chest or abdominal pain.  You vomit repeatedly.  You have trouble breathing. This information is not intended to replace advice given to you by your health care provider. Make sure you discuss any questions you have with your health care provider. Document Released: 08/07/2005 Document Revised: 01/13/2016 Document Reviewed: 05/30/2013 Elsevier Interactive Patient Education  2017 Elsevier Inc.  

## 2016-10-03 NOTE — Progress Notes (Signed)
Pre visit review using our clinic review tool, if applicable. No additional management support is needed unless otherwise documented below in the visit note. 

## 2016-10-04 LAB — CBC WITH DIFFERENTIAL/PLATELET
Basophils Absolute: 0.1 10*3/uL (ref 0.0–0.1)
Basophils Relative: 0.9 % (ref 0.0–3.0)
Eosinophils Absolute: 0.3 10*3/uL (ref 0.0–0.7)
Eosinophils Relative: 4.1 % (ref 0.0–5.0)
HCT: 42.5 % (ref 36.0–46.0)
Hemoglobin: 14.3 g/dL (ref 12.0–15.0)
Lymphocytes Relative: 30.9 % (ref 12.0–46.0)
Lymphs Abs: 2.2 10*3/uL (ref 0.7–4.0)
MCHC: 33.6 g/dL (ref 30.0–36.0)
MCV: 93.8 fl (ref 78.0–100.0)
Monocytes Absolute: 0.4 10*3/uL (ref 0.1–1.0)
Monocytes Relative: 6.1 % (ref 3.0–12.0)
Neutro Abs: 4.2 10*3/uL (ref 1.4–7.7)
Neutrophils Relative %: 58 % (ref 43.0–77.0)
Platelets: 315 10*3/uL (ref 150.0–400.0)
RBC: 4.53 Mil/uL (ref 3.87–5.11)
RDW: 12.6 % (ref 11.5–15.5)
WBC: 7.3 10*3/uL (ref 4.0–10.5)

## 2016-10-04 LAB — LIPID PANEL
Cholesterol: 243 mg/dL — ABNORMAL HIGH (ref 0–200)
HDL: 80.2 mg/dL (ref >39.00)
LDL Cholesterol: 143 mg/dL — ABNORMAL HIGH (ref 0–99)
NonHDL: 163.13
Total CHOL/HDL Ratio: 3
Triglycerides: 102 mg/dL (ref 0.0–149.0)
VLDL: 20.4 mg/dL (ref 0.0–40.0)

## 2016-10-04 LAB — COMPREHENSIVE METABOLIC PANEL
ALT: 10 U/L (ref 0–35)
AST: 15 U/L (ref 0–37)
Albumin: 4 g/dL (ref 3.5–5.2)
Alkaline Phosphatase: 91 U/L (ref 39–117)
BUN: 14 mg/dL (ref 6–23)
CO2: 31 mEq/L (ref 19–32)
Calcium: 9.5 mg/dL (ref 8.4–10.5)
Chloride: 102 mEq/L (ref 96–112)
Creatinine, Ser: 0.8 mg/dL (ref 0.40–1.20)
GFR: 77.59 mL/min (ref >60.00)
Glucose, Bld: 91 mg/dL (ref 70–99)
Potassium: 4.4 mEq/L (ref 3.5–5.1)
Sodium: 139 mEq/L (ref 135–145)
Total Bilirubin: 0.3 mg/dL (ref 0.2–1.2)
Total Protein: 7.2 g/dL (ref 6.0–8.3)

## 2016-10-07 ENCOUNTER — Encounter: Payer: Self-pay | Admitting: Family Medicine

## 2016-10-07 LAB — VITAMIN D 1,25 DIHYDROXY
Vitamin D 1, 25 (OH)2 Total: 34 pg/mL (ref 18–72)
Vitamin D2 1, 25 (OH)2: <8 pg/mL
Vitamin D3 1, 25 (OH)2: 34 pg/mL

## 2016-10-11 DIAGNOSIS — H401132 Primary open-angle glaucoma, bilateral, moderate stage: Secondary | ICD-10-CM | POA: Diagnosis not present

## 2016-10-26 ENCOUNTER — Ambulatory Visit (INDEPENDENT_AMBULATORY_CARE_PROVIDER_SITE_OTHER): Payer: Federal, State, Local not specified - PPO | Admitting: Family Medicine

## 2016-10-26 ENCOUNTER — Encounter: Payer: Self-pay | Admitting: Family Medicine

## 2016-10-26 DIAGNOSIS — I10 Essential (primary) hypertension: Secondary | ICD-10-CM | POA: Diagnosis not present

## 2016-10-26 NOTE — Patient Instructions (Signed)

## 2016-10-26 NOTE — Progress Notes (Signed)
Patient ID: Megan Murphy, female   DOB: 08-23-1955, 61 y.o.   MRN: [id number]      Subjective:  I acted as a Education administrator for Dr. Carollee Herter.  Megan Murphy, [place]   Patient ID: Megan Murphy, female    DOB: [birth year], 61 y.o.   MRN: [id number]  Chief Complaint  Patient presents with  . Hypertension   HPI  Hypertension  This is a chronic problem. The problem is controlled. Pertinent negatives include no blurred vision, chest pain, headaches, malaise/fatigue, palpitations or shortness of breath. There are no associated agents to hypertension. Past treatments include calcium channel blockers. The current treatment provides significant improvement. There are no compliance problems.     Patient is in today for follow up hypertension.  Has been doing well on current treatment.    Patient Care Team: Ann Held, DO as PCP - General (Family Medicine) Princess Bruins, MD as Consulting Physician (Obstetrics and Gynecology)   Past Medical History:  Diagnosis Date  . Basal cell cancer     X 2;Dr Tonia Brooms  . History of migraine headaches   . Hyperlipidemia   . Hypertension     Past Surgical History:  Procedure Laterality Date  . BASAL CELL CARCINOMA EXCISION      X 2  . CESAREAN SECTION      X 2; G 2 P 2  . COLONOSCOPY  2012   Dr Olevia Perches, negative  . dilation and curretage     Dr Dellis Filbert  . WISDOM TOOTH EXTRACTION      Family History  Problem Relation Age of Onset  . Cancer Mother     ovarian cancer  . Stroke Sister 86  . Colon polyps Sister   . Hypertension Sister   . Stroke Brother 35  . Hypertension Brother     X 3  . Cancer Maternal Grandmother     bladder cancer  . Diabetes Neg Hx   . Heart attack Brother 58    Social History   Social History  . Marital status: Married    Spouse name: N/A  . Number of children: N/A  . 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 Smoking status:  Never Smoker  . Smokeless tobacco: Never Used  . Alcohol use 0.0 oz/week     Comment:  rarely  . Drug use: No  . Sexual activity: Yes   Other Topics Concern  . Not on file   Social History Narrative   Exercise-- no    Outpatient Medications Prior to Visit  Medication Sig Dispense Refill  . clobetasol ointment (TEMOVATE) 0.05 %     . TRAVATAN Z 0.004 % SOLN ophthalmic solution     . triamcinolone cream (KENALOG) 0.1 % Apply 1 application topically 2 (two) times daily. 30 g 0  . verapamil (CALAN-SR) 180 MG CR tablet TAKE 1/2 TABLET BY MOUTH ATBEDTIME. 30 tablet 2  . Vitamin D, Ergocalciferol, (DRISDOL) 50000 units CAPS capsule Take 1 capsule (50,000 Units total) by mouth every 7 (seven) days. (Patient not taking: Reported on 10/03/2016) 4 capsule 2   No facility-administered medications prior to visit.     Allergies  Allergen Reactions  . Lisinopril     REACTION: cough    Review of Systems  Constitutional: Negative for fever and malaise/fatigue.  HENT: Negative for congestion.   Eyes: Negative for blurred vision.  Respiratory: Negative for cough and  shortness of breath.   Cardiovascular: Negative for chest pain, palpitations and leg swelling.  Gastrointestinal: Negative for vomiting.  Musculoskeletal: Negative for back pain.  Skin: Negative for rash.  Neurological: Negative for loss of consciousness and headaches.       Objective:    Physical Exam  Constitutional: She is oriented to person, place, and time. She appears well-developed and well-nourished. No distress.  HENT:  Head: Normocephalic and atraumatic.  Eyes: Conjunctivae are normal.  Neck: Normal range of motion. No thyromegaly present.  Cardiovascular: Normal rate and regular rhythm.   Pulmonary/Chest: Effort normal and breath sounds normal. She has no wheezes.  Abdominal: Soft. Bowel sounds are normal. There is no tenderness.  Musculoskeletal: Normal range of motion. She exhibits no edema or deformity.    Neurological: She is alert and oriented to person, place, and time.  Skin: Skin is warm and dry. She is not diaphoretic.  Psychiatric: She has a normal mood and affect.  Nursing note and vitals reviewed.   BP 122/80 (BP Location: Left Arm, Cuff Size: Normal)   Pulse 74   Temp 98.3 F (36.8 C) (Oral)   Resp 16   Ht 5\' 3"  (1.6 m)   Wt 158 lb 3.2 oz (71.8 kg)   SpO2 98%   BMI 28.02 kg/m  Wt Readings from Last 3 Encounters:  10/26/16 158 lb 3.2 oz (71.8 kg)  10/03/16 156 lb 6.4 oz (70.9 kg)  12/09/15 163 lb 6.4 oz (74.1 kg)     Lab Results  Component Value Date   WBC 7.3 10/03/2016   HGB 14.3 10/03/2016   HCT 42.5 10/03/2016   PLT 315.0 10/03/2016   GLUCOSE 91 10/03/2016   CHOL 243 (H) 10/03/2016   TRIG 102.0 10/03/2016   HDL 80.20 10/03/2016   LDLDIRECT 134.3 03/13/2012   LDLCALC 143 (H) 10/03/2016   ALT 10 10/03/2016   AST 15 10/03/2016   NA 139 10/03/2016   K 4.4 10/03/2016   CL 102 10/03/2016   CREATININE 0.80 10/03/2016   BUN 14 10/03/2016   CO2 31 10/03/2016   TSH 1.20 12/09/2015   HGBA1C 5.5 03/07/2011    Lab Results  Component Value Date   TSH 1.20 12/09/2015   Lab Results  Component Value Date   WBC 7.3 10/03/2016   HGB 14.3 10/03/2016   HCT 42.5 10/03/2016   MCV 93.8 10/03/2016   PLT 315.0 10/03/2016   Lab Results  Component Value Date   NA 139 10/03/2016   K 4.4 10/03/2016   CO2 31 10/03/2016   GLUCOSE 91 10/03/2016   BUN 14 10/03/2016   CREATININE 0.80 10/03/2016   BILITOT 0.3 10/03/2016   ALKPHOS 91 10/03/2016   AST 15 10/03/2016   ALT 10 10/03/2016   PROT 7.2 10/03/2016   ALBUMIN 4.0 10/03/2016   CALCIUM 9.5 10/03/2016   GFR 77.59 10/03/2016   Lab Results  Component Value Date   CHOL 243 (H) 10/03/2016   Lab Results  Component Value Date   HDL 80.20 10/03/2016   Lab Results  Component Value Date   LDLCALC 143 (H) 10/03/2016   Lab Results  Component Value Date   TRIG 102.0 10/03/2016   Lab Results  Component Value  Date   CHOLHDL 3 10/03/2016   Lab Results  Component Value Date   HGBA1C 5.5 03/07/2011       Assessment & Plan:   Problem List Items Addressed This Visit      Unprioritized   Essential hypertension  con't verapamil Bp stable          I have discontinued Ms. Shawhan's Vitamin D (Ergocalciferol). I am also having her maintain her clobetasol ointment, TRAVATAN Z, verapamil, and triamcinolone cream.  No orders of the defined types were placed in this encounter.   CMA served as Education administrator during this visit. History, Physical and Plan performed by medical provider. Documentation and orders reviewed and attested to.  Ann Held, DO

## 2016-10-26 NOTE — Progress Notes (Signed)
Pre visit review using our clinic review tool, if applicable. No additional management support is needed unless otherwise documented below in the visit note. 

## 2016-10-29 MED ORDER — VERAPAMIL HCL 80 MG PO TABS: 80.0000 mg | ORAL_TABLET | Freq: Three times a day (TID) | ORAL | 0 refills | Status: DC

## 2016-10-29 NOTE — Assessment & Plan Note (Signed)
con't verapamil Bp stable

## 2017-01-24 DIAGNOSIS — Z01419 Encounter for gynecological examination (general) (routine) without abnormal findings: Secondary | ICD-10-CM | POA: Diagnosis not present

## 2017-01-24 DIAGNOSIS — Z1231 Encounter for screening mammogram for malignant neoplasm of breast: Secondary | ICD-10-CM | POA: Diagnosis not present

## 2017-01-24 DIAGNOSIS — Z6828 Body mass index (BMI) 28.0-28.9, adult: Secondary | ICD-10-CM | POA: Diagnosis not present

## 2017-01-24 DIAGNOSIS — Z8041 Family history of malignant neoplasm of ovary: Secondary | ICD-10-CM | POA: Diagnosis not present

## 2017-03-05 DIAGNOSIS — Z8041 Family history of malignant neoplasm of ovary: Secondary | ICD-10-CM | POA: Diagnosis not present

## 2017-05-10 ENCOUNTER — Ambulatory Visit (INDEPENDENT_AMBULATORY_CARE_PROVIDER_SITE_OTHER): Payer: Federal, State, Local not specified - PPO | Admitting: Family Medicine

## 2017-05-10 ENCOUNTER — Encounter: Payer: Self-pay | Admitting: Family Medicine

## 2017-05-10 VITALS — BP 112/70 | HR 70 | Temp 98.2°F | Ht 63.0 in | Wt 161.4 lb

## 2017-05-10 DIAGNOSIS — I10 Essential (primary) hypertension: Secondary | ICD-10-CM | POA: Diagnosis not present

## 2017-05-10 DIAGNOSIS — Z0000 Encounter for general adult medical examination without abnormal findings: Secondary | ICD-10-CM

## 2017-05-10 LAB — CBC WITH DIFFERENTIAL/PLATELET
Basophils Absolute: 0 10*3/uL (ref 0.0–0.1)
Basophils Relative: 0.5 % (ref 0.0–3.0)
Eosinophils Absolute: 0.1 10*3/uL (ref 0.0–0.7)
Eosinophils Relative: 2.6 % (ref 0.0–5.0)
HCT: 41.6 % (ref 36.0–46.0)
Hemoglobin: 14 g/dL (ref 12.0–15.0)
Lymphocytes Relative: 36.5 % (ref 12.0–46.0)
Lymphs Abs: 1.6 10*3/uL (ref 0.7–4.0)
MCHC: 33.6 g/dL (ref 30.0–36.0)
MCV: 95.3 fl (ref 78.0–100.0)
Monocytes Absolute: 0.3 10*3/uL (ref 0.1–1.0)
Monocytes Relative: 5.8 % (ref 3.0–12.0)
Neutro Abs: 2.4 10*3/uL (ref 1.4–7.7)
Neutrophils Relative %: 54.6 % (ref 43.0–77.0)
Platelets: 259 10*3/uL (ref 150.0–400.0)
RBC: 4.37 Mil/uL (ref 3.87–5.11)
RDW: 12.6 % (ref 11.5–15.5)
WBC: 4.4 10*3/uL (ref 4.0–10.5)

## 2017-05-10 LAB — POC URINALSYSI DIPSTICK (AUTOMATED)
Bilirubin, UA: NEGATIVE
Blood, UA: NEGATIVE
Glucose, UA: NEGATIVE
Ketones, UA: NEGATIVE
Leukocytes, UA: NEGATIVE
Nitrite, UA: NEGATIVE
Protein, UA: NEGATIVE
Spec Grav, UA: 1.015 (ref 1.010–1.025)
Urobilinogen, UA: 0.2 E.U./dL
pH, UA: 6 (ref 5.0–8.0)

## 2017-05-10 LAB — LIPID PANEL
Cholesterol: 238 mg/dL — ABNORMAL HIGH (ref 0–200)
HDL: 107.4 mg/dL (ref >39.00)
LDL Cholesterol: 118 mg/dL — ABNORMAL HIGH (ref 0–99)
NonHDL: 130.26
Total CHOL/HDL Ratio: 2
Triglycerides: 62 mg/dL (ref 0.0–149.0)
VLDL: 12.4 mg/dL (ref 0.0–40.0)

## 2017-05-10 LAB — COMPREHENSIVE METABOLIC PANEL
ALT: 11 U/L (ref 0–35)
AST: 14 U/L (ref 0–37)
Albumin: 3.9 g/dL (ref 3.5–5.2)
Alkaline Phosphatase: 83 U/L (ref 39–117)
BUN: 16 mg/dL (ref 6–23)
CO2: 30 mEq/L (ref 19–32)
Calcium: 9.3 mg/dL (ref 8.4–10.5)
Chloride: 104 mEq/L (ref 96–112)
Creatinine, Ser: 0.65 mg/dL (ref 0.40–1.20)
GFR: 98.41 mL/min (ref >60.00)
Glucose, Bld: 84 mg/dL (ref 70–99)
Potassium: 3.8 mEq/L (ref 3.5–5.1)
Sodium: 140 mEq/L (ref 135–145)
Total Bilirubin: 0.4 mg/dL (ref 0.2–1.2)
Total Protein: 7.1 g/dL (ref 6.0–8.3)

## 2017-05-10 LAB — TSH: TSH: 2.21 u[IU]/mL (ref 0.35–4.50)

## 2017-05-10 MED ORDER — VERAPAMIL HCL ER 180 MG PO TBCR: EXTENDED_RELEASE_TABLET | ORAL | 3 refills | Status: DC

## 2017-05-10 NOTE — Progress Notes (Signed)
Subjective:     Megan Murphy is a 61 y.o. female and is here for a comprehensive physical exam. The patient reports no problems.  Social History   Social History  . Marital status: Married    Spouse name: N/A  . Number of children: N/A  . 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 Smoking status: Never Smoker  . Smokeless tobacco: Never Used  . Alcohol use 0.0 oz/week     Comment:  rarely  . Drug use: No  . Sexual activity: Yes   Other Topics Concern  . Not on file   Social History Narrative   Exercise-- no   Health Maintenance  Topic Date Due  . HIV Screening  02/09/1971  . MAMMOGRAM  04/06/2017  . INFLUENZA VACCINE  11/18/2017 (Originally 03/21/2017)  . PAP SMEAR  04/06/2018  . COLONOSCOPY  05/24/2020  . TETANUS/TDAP  01/11/2022  . Hepatitis C Screening  Completed    The following portions of the patient's history were reviewed and updated as appropriate:  She  has a past medical history of Basal cell cancer; History of migraine headaches; Hyperlipidemia; and Hypertension. She  does not have any pertinent problems on file. She  has a past surgical history that includes dilation and curretage; Colonoscopy (2012); Excision basal cell carcinoma; Wisdom tooth extraction; and Cesarean section. Her family history includes Cancer in her maternal grandmother and mother; Colon polyps in her sister; Heart attack (age of onset: 52) in her brother; Hypertension in her brother and sister; Stroke (age of onset: 29) in her sister; Stroke (age of onset: 40) in her brother. She  reports that she has never smoked. She has never used smokeless tobacco. She reports that she drinks alcohol. She reports that she does not use drugs. She has a current medication list which includes the following prescription(s): clobetasol ointment, triamcinolone cream, and verapamil. Current Outpatient Prescriptions on File Prior to  Visit  Medication Sig Dispense Refill  . clobetasol ointment (TEMOVATE) 0.05 %     . triamcinolone cream (KENALOG) 0.1 % Apply 1 application topically 2 (two) times daily. 30 g 0   No current facility-administered medications on file prior to visit.    She is allergic to lisinopril..  Review of Systems Review of Systems  Constitutional: Negative for activity change, appetite change and fatigue.  HENT: Negative for hearing loss, congestion, tinnitus and ear discharge.  dentist q20m Eyes: Negative for visual disturbance (see optho q1y -- vision corrected to 20/20 with glasses).  Respiratory: Negative for cough, chest tightness and shortness of breath.   Cardiovascular: Negative for chest pain, palpitations and leg swelling.  Gastrointestinal: Negative for abdominal pain, diarrhea, constipation and abdominal distention.  Genitourinary: Negative for urgency, frequency, decreased urine volume and difficulty urinating.  Musculoskeletal: Negative for back pain, arthralgias and gait problem.  Skin: Negative for color change, pallor and rash.  Neurological: Negative for dizziness, light-headedness, numbness and headaches.  Hematological: Negative for adenopathy. Does not bruise/bleed easily.  Psychiatric/Behavioral: Negative for suicidal ideas, confusion, sleep disturbance, self-injury, dysphoric mood, decreased concentration and agitation.       Objective:    BP 112/70 (BP Location: Right Arm, Patient Position: Sitting, Cuff Size: Normal)   Pulse 70   Temp 98.2 F (36.8 C) (Oral)   Ht 5\' 3"  (1.6 m)   Wt 161 lb 6.4 oz (73.2 kg)   SpO2 96%   BMI  28.59 kg/m  General appearance: alert, cooperative, appears stated age and no distress Head: Normocephalic, without obvious abnormality, atraumatic Eyes: conjunctivae/corneas clear. PERRL, EOM's intact. Fundi benign. Ears: normal TM's and external ear canals both ears Nose: Nares normal. Septum midline. Mucosa normal. No drainage or sinus  tenderness. Throat: lips, mucosa, and tongue normal; teeth and gums normal Neck: no adenopathy, no carotid bruit, no JVD, supple, symmetrical, trachea midline and thyroid not enlarged, symmetric, no tenderness/mass/nodules Back: symmetric, no curvature. ROM normal. No CVA tenderness. Lungs: clear to auscultation bilaterally Breasts: gyn Heart: regular rate and rhythm, S1, S2 normal, no murmur, click, rub or gallop Abdomen: soft, non-tender; bowel sounds normal; no masses,  no organomegaly Pelvic: deferred--gyn Extremities: extremities normal, atraumatic, no cyanosis or edema Pulses: 2+ and symmetric Skin: Skin color, texture, turgor normal. No rashes or lesions Lymph nodes: Cervical, supraclavicular, and axillary nodes normal. Neurologic: Alert and oriented X 3, normal strength and tone. Normal symmetric reflexes. Normal coordination and gait    Assessment:    Healthy female exam.      Plan:    ghm utd Check labs See After Visit Summary for Counseling Recommendations    1. Preventative health care See above - TSH - Lipid panel - CBC with Differential/Platelet - POCT Urinalysis Dipstick (Automated) - Comprehensive metabolic panel  2. Essential hypertension Well controlled, no changes to meds. Encouraged heart healthy diet such as the DASH diet and exercise as tolerated.  - verapamil (CALAN-SR) 180 MG CR tablet; TAKE 1/2 TABLET BY MOUTH ATBEDTIME.  Dispense: 45 tablet; Refill: 3

## 2017-05-10 NOTE — Patient Instructions (Signed)
Preventive Care 40-64 Years, Female Preventive care refers to lifestyle choices and visits with your health care provider that can promote health and wellness. What does preventive care include?  A yearly physical exam. This is also called an annual well check.  Dental exams once or twice a year.  Routine eye exams. Ask your health care provider how often you should have your eyes checked.  Personal lifestyle choices, including: ? Daily care of your teeth and gums. ? Regular physical activity. ? Eating a healthy diet. ? Avoiding tobacco and drug use. ? Limiting alcohol use. ? Practicing safe sex. ? Taking low-dose aspirin daily starting at age 58. ? Taking vitamin and mineral supplements as recommended by your health care provider. What happens during an annual well check? The services and screenings done by your health care provider during your annual well check will depend on your age, overall health, lifestyle risk factors, and family history of disease. Counseling Your health care provider may ask you questions about your:  Alcohol use.  Tobacco use.  Drug use.  Emotional well-being.  Home and relationship well-being.  Sexual activity.  Eating habits.  Work and work Statistician.  Method of birth control.  Menstrual cycle.  Pregnancy history.  Screening You may have the following tests or measurements:  Height, weight, and BMI.  Blood pressure.  Lipid and cholesterol levels. These may be checked every 5 years, or more frequently if you are over 81 years old.  Skin check.  Lung cancer screening. You may have this screening every year starting at age 78 if you have a 30-pack-year history of smoking and currently smoke or have quit within the past 15 years.  Fecal occult blood test (FOBT) of the stool. You may have this test every year starting at age 65.  Flexible sigmoidoscopy or colonoscopy. You may have a sigmoidoscopy every 5 years or a colonoscopy  every 10 years starting at age 30.  Hepatitis C blood test.  Hepatitis B blood test.  Sexually transmitted disease (STD) testing.  Diabetes screening. This is done by checking your blood sugar (glucose) after you have not eaten for a while (fasting). You may have this done every 1-3 years.  Mammogram. This may be done every 1-2 years. Talk to your health care provider about when you should start having regular mammograms. This may depend on whether you have a family history of breast cancer.  BRCA-related cancer screening. This may be done if you have a family history of breast, ovarian, tubal, or peritoneal cancers.  Pelvic exam and Pap test. This may be done every 3 years starting at age 80. Starting at age 36, this may be done every 5 years if you have a Pap test in combination with an HPV test.  Bone density scan. This is done to screen for osteoporosis. You may have this scan if you are at high risk for osteoporosis.  Discuss your test results, treatment options, and if necessary, the need for more tests with your health care provider. Vaccines Your health care provider may recommend certain vaccines, such as:  Influenza vaccine. This is recommended every year.  Tetanus, diphtheria, and acellular pertussis (Tdap, Td) vaccine. You may need a Td booster every 10 years.  Varicella vaccine. You may need this if you have not been vaccinated.  Zoster vaccine. You may need this after age 5.  Measles, mumps, and rubella (MMR) vaccine. You may need at least one dose of MMR if you were born in  1957 or later. You may also need a second dose.  Pneumococcal 13-valent conjugate (PCV13) vaccine. You may need this if you have certain conditions and were not previously vaccinated.  Pneumococcal polysaccharide (PPSV23) vaccine. You may need one or two doses if you smoke cigarettes or if you have certain conditions.  Meningococcal vaccine. You may need this if you have certain  conditions.  Hepatitis A vaccine. You may need this if you have certain conditions or if you travel or work in places where you may be exposed to hepatitis A.  Hepatitis B vaccine. You may need this if you have certain conditions or if you travel or work in places where you may be exposed to hepatitis B.  Haemophilus influenzae type b (Hib) vaccine. You may need this if you have certain conditions.  Talk to your health care provider about which screenings and vaccines you need and how often you need them. This information is not intended to replace advice given to you by your health care provider. Make sure you discuss any questions you have with your health care provider. Document Released: 09/03/2015 Document Revised: 04/26/2016 Document Reviewed: 06/08/2015 Elsevier Interactive Patient Education  2017 [employer].

## 2017-09-20 DIAGNOSIS — K080 Exfoliation of teeth due to systemic causes: Secondary | ICD-10-CM | POA: Diagnosis not present

## 2017-11-08 ENCOUNTER — Ambulatory Visit (HOSPITAL_BASED_OUTPATIENT_CLINIC_OR_DEPARTMENT_OTHER)
Admission: RE | Admit: 2017-11-08 | Discharge: 2017-11-08 | Disposition: A | Discharge status: Home / Self Care | Payer: Federal, State, Local not specified - PPO | Source: Ambulatory Visit | Attending: Family Medicine | Admitting: Family Medicine

## 2017-11-08 ENCOUNTER — Encounter: Payer: Self-pay | Admitting: Family Medicine

## 2017-11-08 ENCOUNTER — Ambulatory Visit: Payer: Federal, State, Local not specified - PPO | Admitting: Family Medicine

## 2017-11-08 VITALS — BP 126/80 | HR 84 | Temp 98.4°F | Resp 16 | Ht 63.0 in | Wt 171.4 lb

## 2017-11-08 DIAGNOSIS — E782 Mixed hyperlipidemia: Secondary | ICD-10-CM

## 2017-11-08 DIAGNOSIS — I10 Essential (primary) hypertension: Secondary | ICD-10-CM | POA: Diagnosis not present

## 2017-11-08 DIAGNOSIS — K8020 Calculus of gallbladder without cholecystitis without obstruction: Secondary | ICD-10-CM | POA: Insufficient documentation

## 2017-11-08 DIAGNOSIS — R1011 Right upper quadrant pain: Secondary | ICD-10-CM

## 2017-11-08 LAB — CBC WITH DIFFERENTIAL/PLATELET
Basophils Absolute: 0 10*3/uL (ref 0.0–0.1)
Basophils Relative: 0.3 % (ref 0.0–3.0)
Eosinophils Absolute: 0.3 10*3/uL (ref 0.0–0.7)
Eosinophils Relative: 5.7 % — ABNORMAL HIGH (ref 0.0–5.0)
HCT: 41.8 % (ref 36.0–46.0)
Hemoglobin: 14.1 g/dL (ref 12.0–15.0)
Lymphocytes Relative: 42.4 % (ref 12.0–46.0)
Lymphs Abs: 1.9 10*3/uL (ref 0.7–4.0)
MCHC: 33.7 g/dL (ref 30.0–36.0)
MCV: 94 fl (ref 78.0–100.0)
Monocytes Absolute: 0.3 10*3/uL (ref 0.1–1.0)
Monocytes Relative: 6.8 % (ref 3.0–12.0)
Neutro Abs: 2 10*3/uL (ref 1.4–7.7)
Neutrophils Relative %: 44.8 % (ref 43.0–77.0)
Platelets: 247 10*3/uL (ref 150.0–400.0)
RBC: 4.44 Mil/uL (ref 3.87–5.11)
RDW: 12 % (ref 11.5–15.5)
WBC: 4.5 10*3/uL (ref 4.0–10.5)

## 2017-11-08 LAB — COMPREHENSIVE METABOLIC PANEL
ALT: 13 U/L (ref 0–35)
AST: 15 U/L (ref 0–37)
Albumin: 4.2 g/dL (ref 3.5–5.2)
Alkaline Phosphatase: 82 U/L (ref 39–117)
BUN: 14 mg/dL (ref 6–23)
CO2: 28 mEq/L (ref 19–32)
Calcium: 10 mg/dL (ref 8.4–10.5)
Chloride: 105 mEq/L (ref 96–112)
Creatinine, Ser: 0.67 mg/dL (ref 0.40–1.20)
GFR: 94.87 mL/min (ref >60.00)
Glucose, Bld: 102 mg/dL — ABNORMAL HIGH (ref 70–99)
Potassium: 3.9 mEq/L (ref 3.5–5.1)
Sodium: 140 mEq/L (ref 135–145)
Total Bilirubin: 0.3 mg/dL (ref 0.2–1.2)
Total Protein: 7 g/dL (ref 6.0–8.3)

## 2017-11-08 LAB — LIPID PANEL
Cholesterol: 260 mg/dL — ABNORMAL HIGH (ref 0–200)
HDL: 90.6 mg/dL (ref >39.00)
LDL Cholesterol: 154 mg/dL — ABNORMAL HIGH (ref 0–99)
NonHDL: 169.56
Total CHOL/HDL Ratio: 3
Triglycerides: 78 mg/dL (ref 0.0–149.0)
VLDL: 15.6 mg/dL (ref 0.0–40.0)

## 2017-11-08 MED ORDER — VERAPAMIL HCL ER 180 MG PO TBCR: EXTENDED_RELEASE_TABLET | ORAL | 3 refills | Status: DC

## 2017-11-08 NOTE — Patient Instructions (Signed)

## 2017-11-08 NOTE — Assessment & Plan Note (Signed)
Well controlled, no changes to meds. Encouraged heart healthy diet such as the DASH diet and exercise as tolerated.  °

## 2017-11-08 NOTE — Assessment & Plan Note (Signed)
Encouraged heart healthy diet, increase exercise, avoid trans fats, consider a krill oil cap daily 

## 2017-11-08 NOTE — Progress Notes (Signed)
Patient ID: Megan Murphy, female   DOB: 02/22/1956, 62 y.o.   MRN: [id number]    Subjective:  I acted as a Education administrator for Dr. Carollee Herter.  Megan Murphy, Megan Murphy   Patient ID: Megan Murphy, female    DOB: [birth year], 62 y.o.   MRN: [id number]  Chief Complaint  Patient presents with  . Hypertension    HPI  Patient is in today for follow up blood pressure. Pt also c/o RUQ pain and has hx GB problems -- 30+ years ago.   She did not have it out because someone told her if she slept on her R side it would not hurt ---- pain went away until 3/10.   No fevers ,  Pain comes and goes.    Patient Care Team: Carollee Herter, Alferd Apa, DO as PCP - General (Family Medicine) Murrell Redden Earlyne Iba, MD as Consulting Physician (Obstetrics and Gynecology)   Past Medical History:  Diagnosis Date  . Basal cell cancer     X 2;Dr Tonia Brooms  . History of migraine headaches   . Hyperlipidemia   . Hypertension     Past Surgical History:  Procedure Laterality Date  . BASAL CELL CARCINOMA EXCISION      X 2  . CESAREAN SECTION      X 2; G 2 P 2  . COLONOSCOPY  2012   Dr Olevia Perches, negative  . dilation and curretage     Dr Dellis Filbert  . WISDOM TOOTH EXTRACTION      Family History  Problem Relation Age of Onset  . Cancer Mother        ovarian cancer  . Stroke Sister 57  . Colon polyps Sister   . Hypertension Sister   . Stroke Brother 59  . Hypertension Brother        X 3  . Cancer Maternal Grandmother        bladder cancer  . Heart attack Brother 69  . Diabetes Neg Hx     Social History   Socioeconomic History  . Marital status: Married    Spouse name: Not on file  . Number of children: Not on file  . Years of education: Not on file  . Highest education level: Not on file  Occupational History  . Occupation: Psychologist, occupational: [employer]  Social Needs  . Financial resource strain: Not on file  . Food insecurity:    Worry: Not on file    Inability: Not on file  . Transportation  needs:    Medical: Not on file    Non-medical: Not on file  Tobacco Use  . Smoking status: Never Smoker  . Smokeless tobacco: Never Used  Substance and Sexual Activity  . Alcohol use: Yes    Alcohol/week: 0.0 oz    Comment:  rarely  . Drug use: No  . Sexual activity: Yes  Lifestyle  . Physical activity:    Days per week: Not on file    Minutes per session: Not on file  . Stress: Not on file  Relationships  . Social connections:    Talks on phone: Not on file    Gets together: Not on file    Attends religious service: Not on file    Active member of club or organization: Not on file    Attends meetings of clubs or organizations: Not on file    Relationship status: Not on file  . Intimate partner violence:    Fear of  current or ex partner: Not on file    Emotionally abused: Not on file    Physically abused: Not on file    Forced sexual activity: Not on file  Other Topics Concern  . Not on file  Social History Narrative   Exercise-- no    Outpatient Medications Prior to Visit  Medication Sig Dispense Refill  . clobetasol ointment (TEMOVATE) 0.05 %     . verapamil (CALAN-SR) 180 MG CR tablet TAKE 1/2 TABLET BY MOUTH ATBEDTIME. 45 tablet 3  . triamcinolone cream (KENALOG) 0.1 % Apply 1 application topically 2 (two) times daily. 30 g 0   No facility-administered medications prior to visit.     Allergies  Allergen Reactions  . Lisinopril     REACTION: cough    Review of Systems  Constitutional: Negative for fever and malaise/fatigue.  HENT: Negative for congestion.   Eyes: Negative for blurred vision.  Respiratory: Negative for cough and shortness of breath.   Cardiovascular: Negative for chest pain, palpitations and leg swelling.  Gastrointestinal: Negative for vomiting.  Musculoskeletal: Negative for back pain.  Skin: Negative for rash.  Neurological: Negative for loss of consciousness and headaches.       Objective:    Physical Exam  Constitutional: She  is oriented to person, place, and time. She appears well-developed and well-nourished.  HENT:  Head: Normocephalic and atraumatic.  Eyes: Conjunctivae and EOM are normal.  Neck: Normal range of motion. Neck supple. No JVD present. Carotid bruit is not present. No thyromegaly present.  Cardiovascular: Normal rate, regular rhythm and normal heart sounds.  No murmur heard. Pulmonary/Chest: Effort normal and breath sounds normal. No respiratory distress. She has no wheezes. She has no rales. She exhibits no tenderness.  Abdominal: Soft. She exhibits no distension and no mass. There is no tenderness. There is no rebound and no guarding.  Musculoskeletal: She exhibits no edema.  Neurological: She is alert and oriented to person, place, and time.  Psychiatric: She has a normal mood and affect.  Nursing note and vitals reviewed.   BP 126/80 (BP Location: Right Arm, Cuff Size: Normal)   Pulse 84   Temp 98.4 F (36.9 C) (Oral)   Resp 16   Ht 5\' 3"  (1.6 m)   Wt 171 lb 6.4 oz (77.7 kg)   SpO2 93%   BMI 30.36 kg/m  Wt Readings from Last 3 Encounters:  11/08/17 171 lb 6.4 oz (77.7 kg)  05/10/17 161 lb 6.4 oz (73.2 kg)  10/26/16 158 lb 3.2 oz (71.8 kg)   BP Readings from Last 3 Encounters:  11/08/17 126/80  05/10/17 112/70  10/26/16 122/80     Immunization History  Administered Date(s) Administered  . Influenza Inj Mdck Quad Pf 06/18/2017  . Influenza-Unspecified 04/15/2015, 05/11/2016  . Tdap 01/12/2012    Health Maintenance  Topic Date Due  . HIV Screening  02/09/1971  . MAMMOGRAM  04/06/2017  . PAP SMEAR  04/06/2018  . COLONOSCOPY  05/24/2020  . TETANUS/TDAP  01/11/2022  . INFLUENZA VACCINE  Completed  . Hepatitis C Screening  Completed    Lab Results  Component Value Date   WBC 4.4 05/10/2017   HGB 14.0 05/10/2017   HCT 41.6 05/10/2017   PLT 259.0 05/10/2017   GLUCOSE 84 05/10/2017   CHOL 238 (H) 05/10/2017   TRIG 62.0 05/10/2017   HDL 107.40 05/10/2017   LDLDIRECT  134.3 03/13/2012   LDLCALC 118 (H) 05/10/2017   ALT 11 05/10/2017   AST 14  05/10/2017   NA 140 05/10/2017   K 3.8 05/10/2017   CL 104 05/10/2017   CREATININE 0.65 05/10/2017   BUN 16 05/10/2017   CO2 30 05/10/2017   TSH 2.21 05/10/2017   HGBA1C 5.5 03/07/2011    Lab Results  Component Value Date   TSH 2.21 05/10/2017   Lab Results  Component Value Date   WBC 4.4 05/10/2017   HGB 14.0 05/10/2017   HCT 41.6 05/10/2017   MCV 95.3 05/10/2017   PLT 259.0 05/10/2017   Lab Results  Component Value Date   NA 140 05/10/2017   K 3.8 05/10/2017   CO2 30 05/10/2017   GLUCOSE 84 05/10/2017   BUN 16 05/10/2017   CREATININE 0.65 05/10/2017   BILITOT 0.4 05/10/2017   ALKPHOS 83 05/10/2017   AST 14 05/10/2017   ALT 11 05/10/2017   PROT 7.1 05/10/2017   ALBUMIN 3.9 05/10/2017   CALCIUM 9.3 05/10/2017   GFR 98.41 05/10/2017   Lab Results  Component Value Date   CHOL 238 (H) 05/10/2017   Lab Results  Component Value Date   HDL 107.40 05/10/2017   Lab Results  Component Value Date   LDLCALC 118 (H) 05/10/2017   Lab Results  Component Value Date   TRIG 62.0 05/10/2017   Lab Results  Component Value Date   CHOLHDL 2 05/10/2017   Lab Results  Component Value Date   HGBA1C 5.5 03/07/2011         Assessment & Plan:   Problem List Items Addressed This Visit      Unprioritized   Essential hypertension    Well controlled, no changes to meds. Encouraged heart healthy diet such as the DASH diet and exercise as tolerated.       Relevant Medications   verapamil (CALAN-SR) 180 MG CR tablet   HYPERLIPIDEMIA    Encouraged heart healthy diet, increase exercise, avoid trans fats, consider a krill oil cap daily      Relevant Medications   verapamil (CALAN-SR) 180 MG CR tablet   RUQ pain - Primary   Relevant Orders   US Abdomen Complete (Completed)   CBC with Differential/Platelet   Lipid panel   Comprehensive metabolic panel      I have discontinued [name].  Arterberry's triamcinolone cream. I am also having her maintain her clobetasol ointment and verapamil.  Meds ordered this encounter  Medications  . verapamil (CALAN-SR) 180 MG CR tablet    Sig: TAKE 1/2 TABLET BY MOUTH ATBEDTIME.    Dispense:  45 tablet    Refill:  3    CMA served as scribe during this visit. History, Physical and Plan performed by medical provider. Documentation and orders reviewed and attested to.  Ann Held, DO

## 2017-11-09 ENCOUNTER — Other Ambulatory Visit: Payer: Self-pay | Admitting: Family Medicine

## 2017-11-09 DIAGNOSIS — K8020 Calculus of gallbladder without cholecystitis without obstruction: Secondary | ICD-10-CM

## 2017-11-13 ENCOUNTER — Other Ambulatory Visit: Payer: Self-pay | Admitting: *Deleted

## 2017-11-13 DIAGNOSIS — E782 Mixed hyperlipidemia: Secondary | ICD-10-CM

## 2017-11-13 DIAGNOSIS — L570 Actinic keratosis: Secondary | ICD-10-CM | POA: Diagnosis not present

## 2017-11-13 DIAGNOSIS — Z872 Personal history of diseases of the skin and subcutaneous tissue: Secondary | ICD-10-CM | POA: Diagnosis not present

## 2017-11-13 DIAGNOSIS — L900 Lichen sclerosus et atrophicus: Secondary | ICD-10-CM | POA: Diagnosis not present

## 2017-11-13 DIAGNOSIS — I10 Essential (primary) hypertension: Secondary | ICD-10-CM

## 2017-11-13 DIAGNOSIS — L821 Other seborrheic keratosis: Secondary | ICD-10-CM | POA: Diagnosis not present

## 2017-11-13 DIAGNOSIS — Z85828 Personal history of other malignant neoplasm of skin: Secondary | ICD-10-CM | POA: Diagnosis not present

## 2017-11-17 IMAGING — MR MR HEAD W/O CM
7 of 8 series · 39 of 48 positions shown · non-contrast
Comparison: None.

CLINICAL DATA: 59-year-old female with 5 month history of falling,
memory loss and syncope. History of basal cell skin cancer. Initial
encounter.

EXAM:
MRI HEAD WITHOUT CONTRAST
TECHNIQUE: Multiplanar, multiecho pulse sequences of the brain and surrounding
structures were obtained without intravenous contrast.

[Series 2: T1 · sagittal · 5.0mm · 0.45mm/px · 3 of 23 slices shown]
[im 1/23]
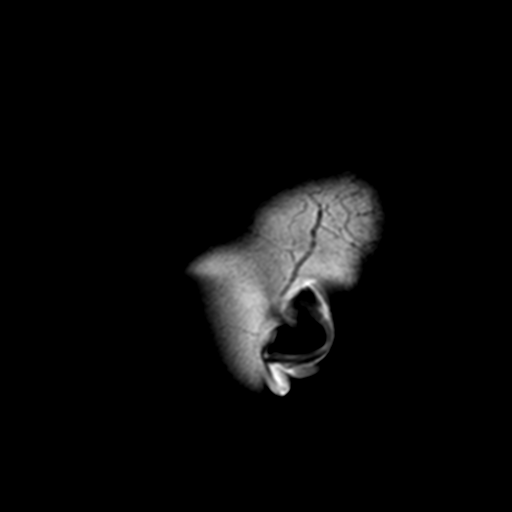
[im 12/23]
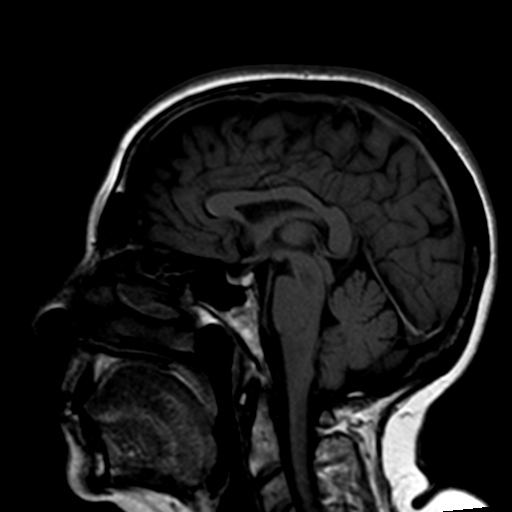
[im 23/23]
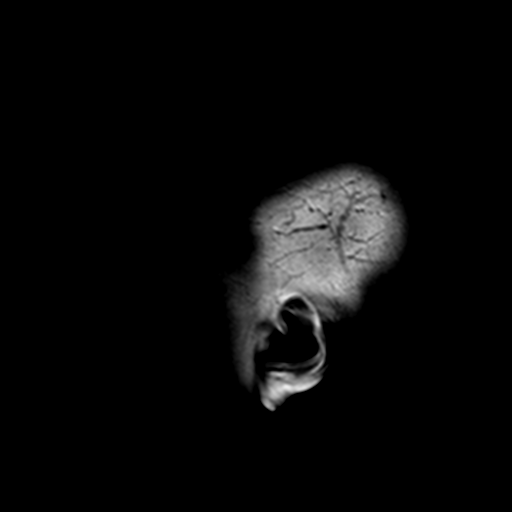

[Series 3: DWI · axial · 3.0mm · 2.19mm/px · z∈[-32,+110]mm · 13 of 90 slices shown (1 of 2)]
[im 1/90]
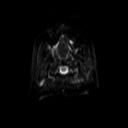
[im 8/90]
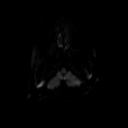
[im 15/90]
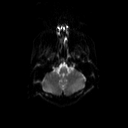
[im 23/90]
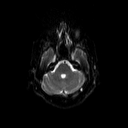
[im 30/90]
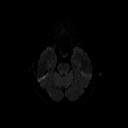
[im 38/90]
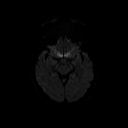
[im 45/90]
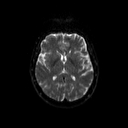
[im 52/90]
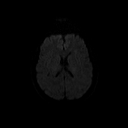
[im 60/90]
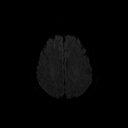
[im 67/90]
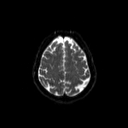
[im 75/90]
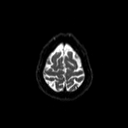
[im 82/90]
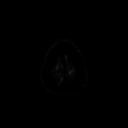
[im 90/90]
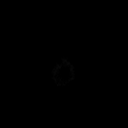

[Series 4: DWI · axial · 3.0mm · 2.19mm/px · z∈[-32,+110]mm · 7 of 45 slices shown (2 of 2)]
[im 1/45]
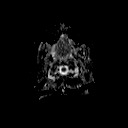
[im 8/45]
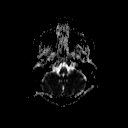
[im 15/45]
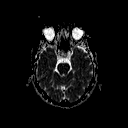
[im 23/45]
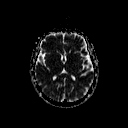
[im 30/45]
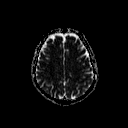
[im 37/45]
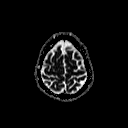
[im 45/45]
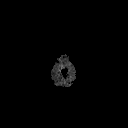

[Series 5: T2 · axial · 5.0mm · 0.45mm/px · z∈[-45,+106]mm · 4 of 23 slices shown (1 of 2)]
[im 1/23]
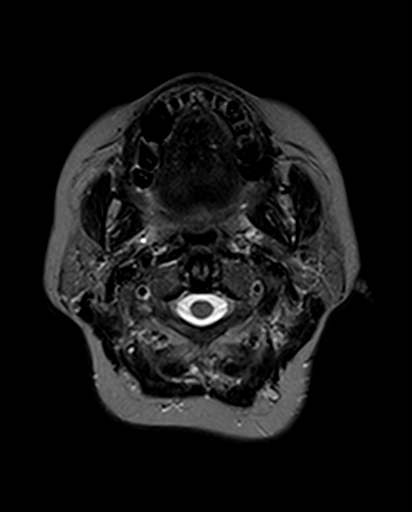
[im 8/23]
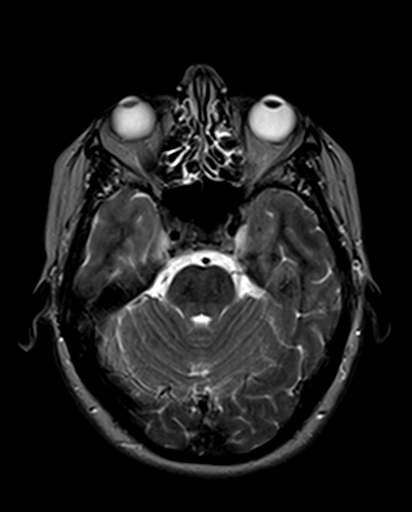
[im 15/23]
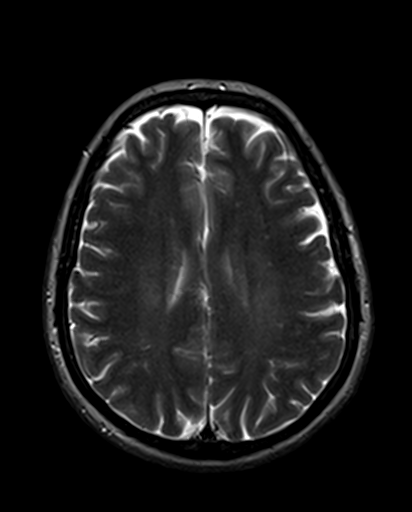
[im 23/23]
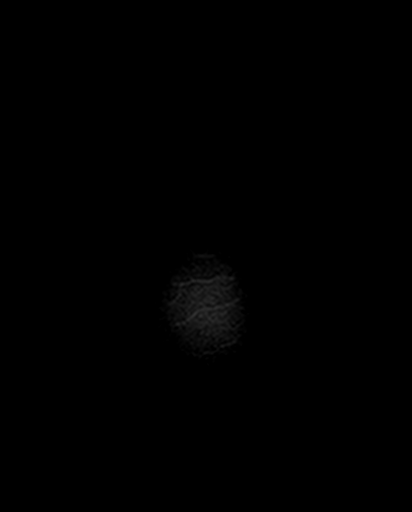

[Series 6: T2 · axial · 5.0mm · 0.45mm/px · z∈[-45,+106]mm · 4 of 23 slices shown (2 of 2)]
[im 1/23]
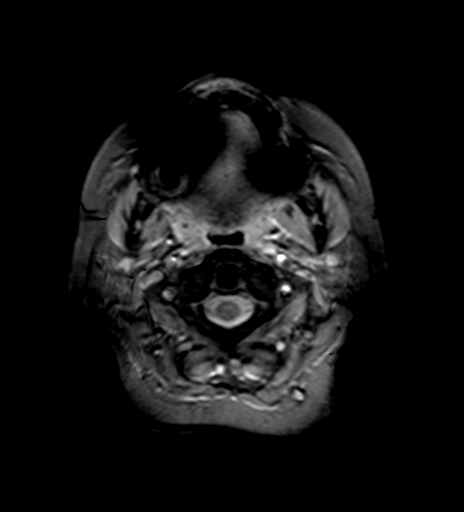
[im 8/23]
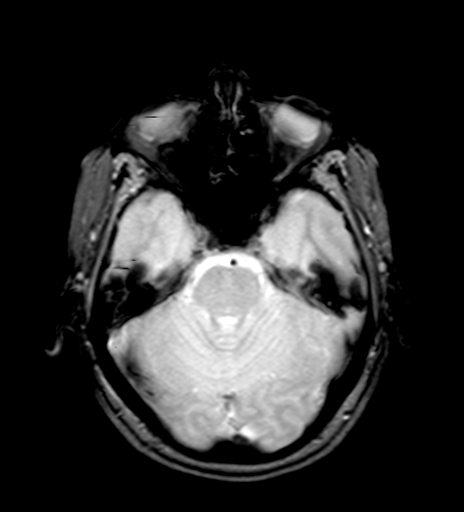
[im 15/23]
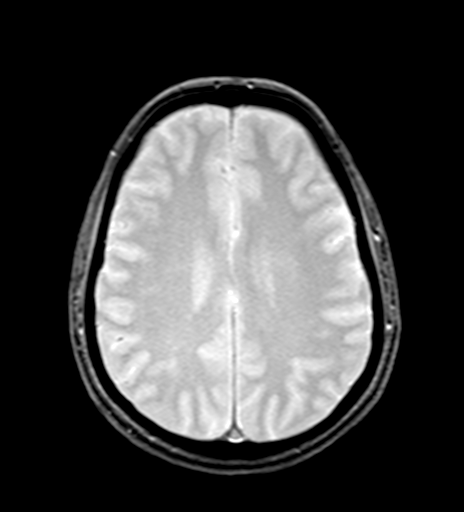
[im 23/23]
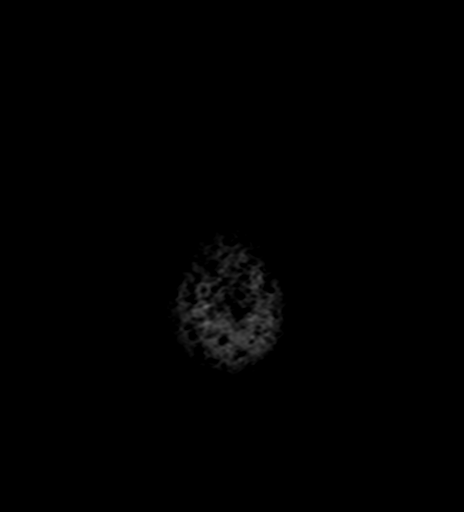

[Series 7: FLAIR · axial · 5.0mm · 0.45mm/px · z∈[-45,+106]mm · 4 of 23 slices shown]
[im 1/23]
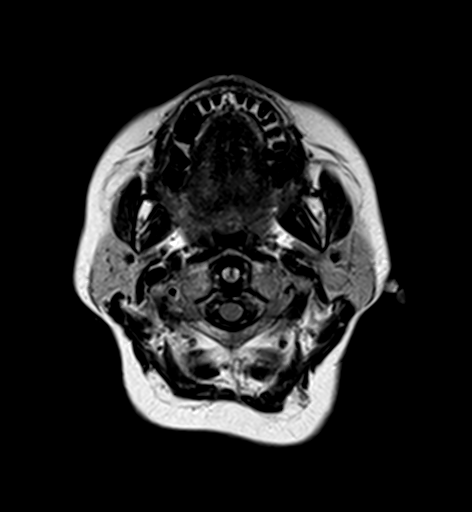
[im 8/23]
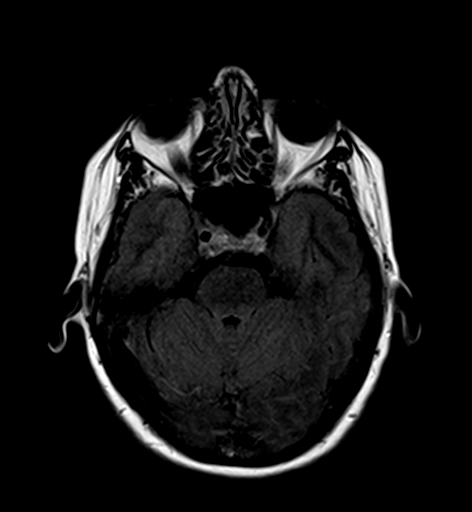
[im 15/23]
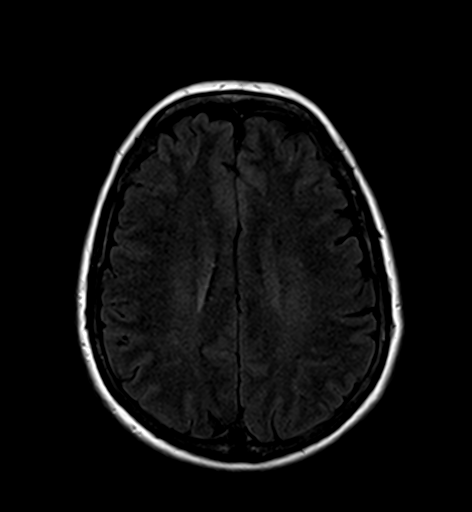
[im 23/23]
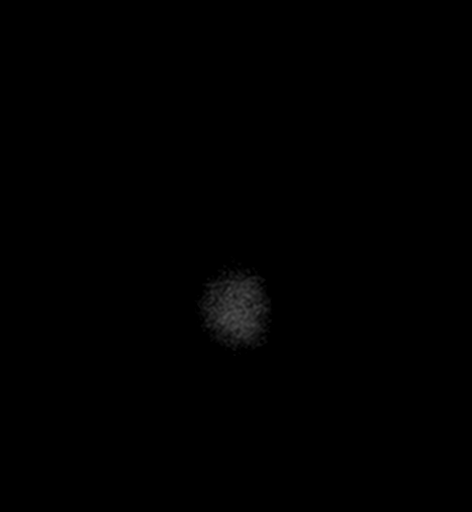

[Series 9: T2 post-contrast · coronal · 5.0mm · 0.45mm/px · 4 of 26 slices shown]
[im 1/26]
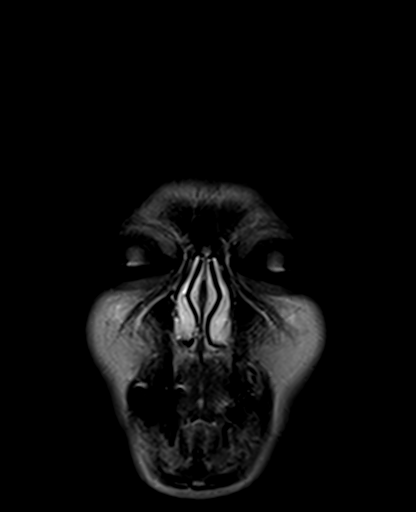
[im 9/26]
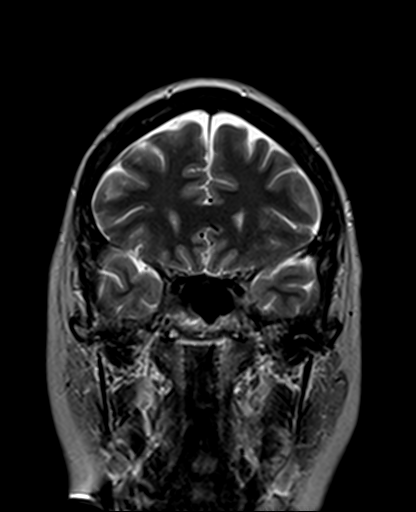
[im 17/26]
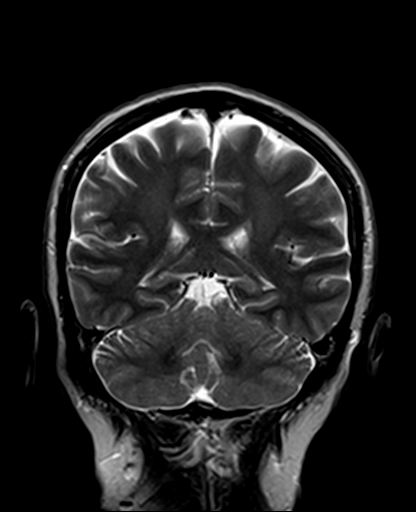
[im 26/26]
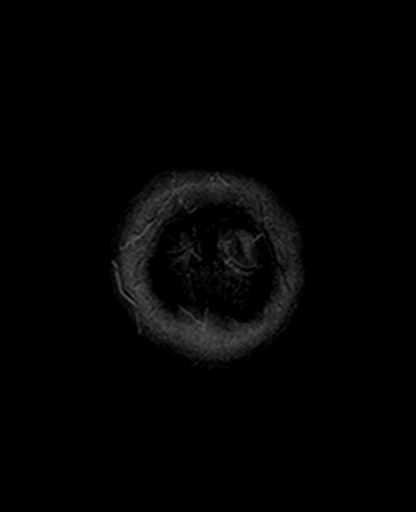

[39 of 48 positions shown; findings below may reference images not displayed]

FINDINGS: No acute infarct or intracranial hemorrhage.

No age advanced atrophy or hydrocephalus.

No intracranial mass lesion noted on this unenhanced exam.

Minimal nonspecific white matter type changes most likely related to
result of chronic microvascular changes in this hypertensive patient
with history of hyperlipidemia.

Major intracranial vascular structures are patent.

Partially empty sella probably incidental finding without secondary
findings of pseudotumor cerebri

Orbital structures unremarkable.

Cervical medullary junction within normal limits.

Heterogeneous bone marrow without focal worrisome lesion.

Paranasal sinus mucosal thickening most notable maxillary sinuses
with polypoid configuration inferior aspect of the maxillary sinuses
bilaterally.
IMPRESSION: Minimal white matter changes probably related to result of chronic
microvascular changes.

No intracranial mass lesion noted on this unenhanced exam.

No hydrocephalus.

Paranasal sinus mucosal thickening most notable maxillary sinuses
with polypoid configuration inferior aspect of the maxillary sinuses
bilaterally.

## 2017-12-06 ENCOUNTER — Ambulatory Visit: Payer: Self-pay | Admitting: Surgery

## 2017-12-06 DIAGNOSIS — K8050 Calculus of bile duct without cholangitis or cholecystitis without obstruction: Secondary | ICD-10-CM | POA: Diagnosis not present

## 2017-12-06 NOTE — H&P (Signed)
Megan Murphy Documented: 12/06/2017 10:34 AM Location: [facility] Patient #: [id number] DOB: 1955-08-28 Married / Language: Megan Murphy / Race: White Female  History of Present Illness ([name] A. Kae [name] MD; 12/06/2017 11:05 AM) Patient words: This is a very pleasant and otherwise relatively healthy 62 year old woman is referred for biliary colic. She has known about her gallstones for about 30 years. Over that period of time she's had 3 attacks of pain. The most recent one was a couple weeks ago and she will continue the middle of the night with pain extending from just under her ribs to just below the umbilicus with a sensation of a fire ball in the epigastrium. This was after eating french fries at dinner the night before. No associated nausea, emesis or fevers. She has undergone a right upper quadrant ultrasound that does demonstrate gallstones in March of this year; common bile duct 3.7 mm, increased hepatic parenchymal echogenicity but otherwise no abnormal findings. Prior surgery includes a C-section. She does not smoke. 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 About a year ago she had to wake up in the middle the night and take her husband to the emergency room and he had undergone emergency cholecystectomy. She is interested in cystectomy to preclude further attacks of pain as well as to avoid the situation suffered by her husband. Her daughter is an Magazine features editor at [employer].  The patient is a 62 year old female.   Past Surgical History Sabino Gasser; 12/06/2017 10:34 AM) Cesarean Section - Multiple Oral Surgery  Diagnostic Studies History Sabino Gasser; 12/06/2017 10:34 AM) Colonoscopy 5-10 years ago Mammogram within last year  Allergies Sabino Gasser; 12/06/2017 10:34 AM) No Known Drug Allergies [12/06/2017]: Allergies Reconciled  Medication History Sabino Gasser; 12/06/2017 10:35 AM) Verapamil HCl (40MG  Tablet, Oral) Active. Clobetasol  Propionate (0.05% Cream, External) Active. Medications Reconciled  Social History Sabino Gasser; 12/06/2017 10:34 AM) Alcohol use Moderate alcohol use. Caffeine use Carbonated beverages, Coffee, Tea. No drug use Tobacco use Never smoker.  Family History Sabino Gasser; 12/06/2017 10:34 AM) Cerebrovascular Accident Brother, Sister. Colon Polyps Brother, Sister. Heart Disease Brother, Sister. Heart disease in female family member before age 70 Hypertension Brother. Ovarian Cancer Mother.  Pregnancy / Birth History Sabino Gasser; 12/06/2017 10:34 AM) Age at menarche 66 years. Age of menopause 84-55 Gravida 2 Length (months) of breastfeeding 3-6 Maternal age 32-25 Para 3 Regular periods  Other Problems Sabino Gasser; 12/06/2017 10:34 AM) Cholelithiasis High blood pressure Hypercholesterolemia     Review of Systems Sabino Gasser; 12/06/2017 10:34 AM) General Present- Fatigue. Not Present- Appetite Loss, Chills, Fever, Night Sweats, Weight Gain and Weight Loss. Skin Not Present- Change in Wart/Mole, Dryness, Hives, Jaundice, New Lesions, Non-Healing Wounds, Rash and Ulcer. HEENT Not Present- Earache, Hearing Loss, Hoarseness, Nose Bleed, Oral Ulcers, Ringing in the Ears, Seasonal Allergies, Sinus Pain, Sore Throat, Visual Disturbances, Wears glasses/contact lenses and Yellow Eyes. Respiratory Not Present- Bloody sputum, Chronic Cough, Difficulty Breathing, Snoring and Wheezing. Breast Not Present- Breast Mass, Breast Pain, Nipple Discharge and Skin Changes. Cardiovascular Not Present- Chest Pain, Difficulty Breathing Lying Down, Leg Cramps, Palpitations, Rapid Heart Rate, Shortness of Breath and Swelling of Extremities. Gastrointestinal Not Present- Abdominal Pain, Bloating, Bloody Stool, Change in Bowel Habits, Chronic diarrhea, Constipation, Difficulty Swallowing, Excessive gas, Gets full quickly at meals, Hemorrhoids, Indigestion, Nausea, Rectal Pain and  Vomiting. Female Genitourinary Not Present- Frequency, Nocturia, Painful Urination, Pelvic Pain and Urgency. Musculoskeletal Not Present- Back Pain, Joint Pain, Joint Stiffness, Muscle Pain, Muscle Weakness  and Swelling of Extremities. Neurological Present- Decreased Memory. Not Present- Fainting, Headaches, Numbness, Seizures, Tingling, Tremor, Trouble walking and Weakness. Psychiatric Not Present- Anxiety, Bipolar, Change in Sleep Pattern, Depression, Fearful and Frequent crying. Endocrine Not Present- Cold Intolerance, Excessive Hunger, Hair Changes, Heat Intolerance, Hot flashes and New Diabetes. Hematology Not Present- Blood Thinners, Easy Bruising, Excessive bleeding, Gland problems, HIV and Persistent Infections.  Vitals Sabino Gasser; 12/06/2017 10:36 AM) 12/06/2017 10:35 AM Weight: 170.25 lb Height: 63in Body Surface Area: 1.81 m Body Mass Index: 30.16 kg/m  Temp.: 98.64F(Oral)  Pulse: 78 (Regular)  BP: 130/82 (Sitting, Left Arm, Standard)      Physical Exam (Anya Murphey A. Kae [name] MD; 12/06/2017 11:05 AM)  The physical exam findings are as follows: Note:Gen: alert and well appearing Eye: extraocular motion intact, no scleral icterus ENT: moist mucus membranes, dentition intact Neck: no mass or thyromegaly Chest: unlabored respirations, symmetrical air entry, clear bilaterally CV: regular rate and rhythm, no pedal edema Abdomen: soft, nontender, nondistended. No mass or organomegaly MSK: strength symmetrical throughout, no deformity Neuro: grossly intact, normal gait Psych: normal mood and affect, appropriate insight Skin: warm and dry, no rash or lesion on limited exam    Assessment & Plan (Goldie Tregoning A. Kae [name] MD; 12/06/2017 32:44 AM)  BILIARY COLIC (W10.27) Story: I discussed with her the options of laparoscopic cholecystectomy versus nonsurgical management with observation. She is not interested in observing and prefers to pursue laparoscopic cholecystectomy.  We discussed the surgery including the technical details and the risks of bleeding, infection, pain, scarring, intra-abdominal injury specifically to the common bile duct or bowel and sequelae, discussed risk of conversion to open surgery, as well as general risks of blood clots, heart attack, pneumonia, stroke and death. She is stressed understanding. Her questions were answered to her satisfaction. We will schedule her at her convenience

## 2018-01-01 ENCOUNTER — Inpatient Hospital Stay (HOSPITAL_COMMUNITY): Admission: RE | Admit: 2018-01-01 | Payer: Federal, State, Local not specified - PPO | Source: Ambulatory Visit

## 2018-01-01 NOTE — Patient Instructions (Addendum)
Megan Murphy  01/01/2018   Your procedure is scheduled on: 01-09-18  Report to [facility] Main  Entrance    Report to Admitting at 11:00  AM    Call this number if you have problems the morning of surgery [phone]   Remember: Do not eat food or drink liquids :After Midnight. You may have a Clear Liquid Diet from Midnight until 7:00 AM. After 7:00 AM, nothing until after surgery.      CLEAR LIQUID DIET   Foods Allowed                                                                     Foods Excluded  Coffee and tea, regular and decaf                             liquids that you cannot  Plain Jell-O in any flavor                                             see through such as: Fruit ices (not with fruit pulp)                                     milk, soups, orange juice  Iced Popsicles                                    All solid food Carbonated beverages, regular and diet                                    Cranberry, grape and apple juices Sports drinks like Gatorade Lightly seasoned clear broth or consume(fat free) Sugar, honey syrup  Sample Menu Breakfast                                Lunch                                     Supper Cranberry juice                    Beef broth                            Chicken broth Jell-O                                     Grape juice  Apple juice Coffee or tea                        Jell-O                                      Popsicle                                                Coffee or tea                        Coffee or tea  _____________________________________________________________________    Take these medicines the morning of surgery with A SIP OF WATER: None                                You may not have any metal on your body including hair pins and              piercings  Do not wear jewelry, make-up, lotions, powders or perfumes, deodorant             Do not  wear nail polish.  Do not shave  48 hours prior to surgery.                 Do not bring valuables to the hospital. [place].  Contacts, dentures or bridgework may not be worn into surgery.  Leave suitcase in the car. After surgery it may be brought to your room.    Special Instructions: Transporter Shakia Sebastiano [phone]             Please read over the following fact sheets you were given: _____________________________________________________________________           [facility] - Preparing for Surgery Before surgery, you can play an important role.  Because skin is not sterile, your skin needs to be as free of germs as possible.  You can reduce the number of germs on your skin by washing with CHG (chlorahexidine gluconate) soap before surgery.  CHG is an antiseptic cleaner which kills germs and bonds with the skin to continue killing germs even after washing. Please DO NOT use if you have an allergy to CHG or antibacterial soaps.  If your skin becomes reddened/irritated stop using the CHG and inform your nurse when you arrive at Short Stay. Do not shave (including legs and underarms) for at least 48 hours prior to the first CHG shower.  You may shave your face/neck. Please follow these instructions carefully:  1.  Shower with CHG Soap the night before surgery and the  morning of Surgery.  2.  If you choose to wash your hair, wash your hair first as usual with your  normal  shampoo.  3.  After you shampoo, rinse your hair and body thoroughly to remove the  shampoo.                           4.  Use CHG as you would any other liquid soap.  You  can apply chg directly  to the skin and wash                       Gently with a scrungie or clean washcloth.  5.  Apply the CHG Soap to your body ONLY FROM THE NECK DOWN.   Do not use on face/ open                           Wound or open sores. Avoid contact with eyes, ears mouth and genitals  (private parts).                       Wash face,  Genitals (private parts) with your normal soap.             6.  Wash thoroughly, paying special attention to the area where your surgery  will be performed.  7.  Thoroughly rinse your body with warm water from the neck down.  8.  DO NOT shower/wash with your normal soap after using and rinsing off  the CHG Soap.                9.  Pat yourself dry with a clean towel.            10.  Wear clean pajamas.            11.  Place clean sheets on your bed the night of your first shower and do not  sleep with pets. Day of Surgery : Do not apply any lotions/deodorants the morning of surgery.  Please wear clean clothes to the hospital/surgery center.  FAILURE TO FOLLOW THESE INSTRUCTIONS MAY RESULT IN THE CANCELLATION OF YOUR SURGERY PATIENT SIGNATURE_________________________________  NURSE SIGNATURE__________________________________  ________________________________________________________________________

## 2018-01-03 ENCOUNTER — Other Ambulatory Visit: Payer: Self-pay

## 2018-01-03 ENCOUNTER — Encounter (HOSPITAL_COMMUNITY)
Admission: RE | Admit: 2018-01-03 | Discharge: 2018-01-03 | Disposition: A | Discharge status: Home / Self Care | Payer: Federal, State, Local not specified - PPO | Source: Ambulatory Visit | Attending: Surgery | Admitting: Surgery

## 2018-01-03 ENCOUNTER — Encounter (HOSPITAL_COMMUNITY): Payer: Self-pay

## 2018-01-03 DIAGNOSIS — I10 Essential (primary) hypertension: Secondary | ICD-10-CM | POA: Insufficient documentation

## 2018-01-03 DIAGNOSIS — Z01812 Encounter for preprocedural laboratory examination: Secondary | ICD-10-CM | POA: Insufficient documentation

## 2018-01-03 DIAGNOSIS — Z01810 Encounter for preprocedural cardiovascular examination: Secondary | ICD-10-CM | POA: Insufficient documentation

## 2018-01-03 LAB — CBC WITH DIFFERENTIAL/PLATELET
Basophils Absolute: 0 10*3/uL (ref 0.0–0.1)
Basophils Relative: 0 %
Eosinophils Absolute: 0.2 10*3/uL (ref 0.0–0.7)
Eosinophils Relative: 3 %
HCT: 42.8 % (ref 36.0–46.0)
Hemoglobin: 14.3 g/dL (ref 12.0–15.0)
Lymphocytes Relative: 34 %
Lymphs Abs: 2 10*3/uL (ref 0.7–4.0)
MCH: 32.4 pg (ref 26.0–34.0)
MCHC: 33.4 g/dL (ref 30.0–36.0)
MCV: 96.8 fL (ref 78.0–100.0)
Monocytes Absolute: 0.3 10*3/uL (ref 0.1–1.0)
Monocytes Relative: 6 %
Neutro Abs: 3.4 10*3/uL (ref 1.7–7.7)
Neutrophils Relative %: 57 %
Platelets: 263 10*3/uL (ref 150–400)
RBC: 4.42 MIL/uL (ref 3.87–5.11)
RDW: 12.2 % (ref 11.5–15.5)
WBC: 5.9 10*3/uL (ref 4.0–10.5)

## 2018-01-03 LAB — COMPREHENSIVE METABOLIC PANEL
ALT: 16 U/L (ref 14–54)
AST: 17 U/L (ref 15–41)
Albumin: 3.6 g/dL (ref 3.5–5.0)
Alkaline Phosphatase: 92 U/L (ref 38–126)
Anion gap: 10 (ref 5–15)
BUN: 16 mg/dL (ref 6–20)
CO2: 26 mmol/L (ref 22–32)
Calcium: 9.5 mg/dL (ref 8.9–10.3)
Chloride: 107 mmol/L (ref 101–111)
Creatinine, Ser: 0.63 mg/dL (ref 0.44–1.00)
GFR calc Af Amer: >60 mL/min (ref >60)
GFR calc non Af Amer: >60 mL/min (ref >60)
Glucose, Bld: 96 mg/dL (ref 65–99)
Potassium: 4.4 mmol/L (ref 3.5–5.1)
Sodium: 143 mmol/L (ref 135–145)
Total Bilirubin: 0.6 mg/dL (ref 0.3–1.2)
Total Protein: 7.2 g/dL (ref 6.5–8.1)

## 2018-01-04 NOTE — Progress Notes (Signed)
Chart given to Ivin Booty, RN to monitor for Final EKG

## 2018-01-09 ENCOUNTER — Ambulatory Visit (HOSPITAL_COMMUNITY): Payer: Federal, State, Local not specified - PPO | Admitting: Anesthesiology

## 2018-01-09 ENCOUNTER — Encounter (HOSPITAL_COMMUNITY): Payer: Self-pay | Admitting: Emergency Medicine

## 2018-01-09 ENCOUNTER — Ambulatory Visit (HOSPITAL_COMMUNITY)
Admission: RE | Admit: 2018-01-09 | Discharge: 2018-01-09 | Disposition: A | Discharge status: Home / Self Care | Payer: Federal, State, Local not specified - PPO | Source: Ambulatory Visit | Attending: Surgery | Admitting: Surgery

## 2018-01-09 ENCOUNTER — Encounter (HOSPITAL_COMMUNITY)
Admission: RE | Disposition: A | Discharge status: Home / Self Care | Payer: Self-pay | Source: Ambulatory Visit | Attending: Surgery

## 2018-01-09 DIAGNOSIS — Z823 Family history of stroke: Secondary | ICD-10-CM | POA: Diagnosis not present

## 2018-01-09 DIAGNOSIS — E7800 Pure hypercholesterolemia, unspecified: Secondary | ICD-10-CM | POA: Insufficient documentation

## 2018-01-09 DIAGNOSIS — K8050 Calculus of bile duct without cholangitis or cholecystitis without obstruction: Secondary | ICD-10-CM | POA: Diagnosis not present

## 2018-01-09 DIAGNOSIS — Z8371 Family history of colonic polyps: Secondary | ICD-10-CM | POA: Diagnosis not present

## 2018-01-09 DIAGNOSIS — K8010 Calculus of gallbladder with chronic cholecystitis without obstruction: Secondary | ICD-10-CM | POA: Diagnosis not present

## 2018-01-09 DIAGNOSIS — Z8249 Family history of ischemic heart disease and other diseases of the circulatory system: Secondary | ICD-10-CM | POA: Insufficient documentation

## 2018-01-09 DIAGNOSIS — I10 Essential (primary) hypertension: Secondary | ICD-10-CM | POA: Insufficient documentation

## 2018-01-09 DIAGNOSIS — K828 Other specified diseases of gallbladder: Secondary | ICD-10-CM | POA: Insufficient documentation

## 2018-01-09 DIAGNOSIS — Z8041 Family history of malignant neoplasm of ovary: Secondary | ICD-10-CM | POA: Insufficient documentation

## 2018-01-09 HISTORY — PX: CHOLECYSTECTOMY: SHX55

## 2018-01-09 SURGERY — LAPAROSCOPIC CHOLECYSTECTOMY
Anesthesia: General | Site: Abdomen

## 2018-01-09 MED ORDER — PHENYLEPHRINE 40 MCG/ML (10ML) SYRINGE FOR IV PUSH (FOR BLOOD PRESSURE SUPPORT)
PREFILLED_SYRINGE | INTRAVENOUS | Status: DC | PRN
  Administered 2018-01-09: 80 ug via INTRAVENOUS

## 2018-01-09 MED ORDER — CELECOXIB 200 MG PO CAPS
200.0000 mg | ORAL_CAPSULE | ORAL | Status: AC
  Administered 2018-01-09: 200 mg via ORAL
  Filled 2018-01-09: qty 1

## 2018-01-09 MED ORDER — ACETAMINOPHEN 650 MG RE SUPP
650.0000 mg | RECTAL | Status: DC | PRN
  Filled 2018-01-09: qty 1

## 2018-01-09 MED ORDER — LACTATED RINGERS IV SOLN: INTRAVENOUS | Status: DC

## 2018-01-09 MED ORDER — PROPOFOL 10 MG/ML IV BOLUS
INTRAVENOUS | Status: AC
  Filled 2018-01-09: qty 20

## 2018-01-09 MED ORDER — ONDANSETRON HCL 4 MG/2ML IJ SOLN
INTRAMUSCULAR | Status: AC
  Filled 2018-01-09: qty 2

## 2018-01-09 MED ORDER — MIDAZOLAM HCL 5 MG/5ML IJ SOLN
INTRAMUSCULAR | Status: DC | PRN
  Administered 2018-01-09: 2 mg via INTRAVENOUS

## 2018-01-09 MED ORDER — HYDROCODONE-ACETAMINOPHEN 5-325 MG PO TABS: 1.0000 | ORAL_TABLET | Freq: Four times a day (QID) | ORAL | 0 refills | Status: DC | PRN

## 2018-01-09 MED ORDER — SUGAMMADEX SODIUM 200 MG/2ML IV SOLN
INTRAVENOUS | Status: DC | PRN
  Administered 2018-01-09: 200 mg via INTRAVENOUS

## 2018-01-09 MED ORDER — ACETAMINOPHEN 325 MG PO TABS: 650.0000 mg | ORAL_TABLET | ORAL | Status: DC | PRN

## 2018-01-09 MED ORDER — FENTANYL CITRATE (PF) 250 MCG/5ML IJ SOLN
INTRAMUSCULAR | Status: DC | PRN
  Administered 2018-01-09 (×3): 50 ug via INTRAVENOUS

## 2018-01-09 MED ORDER — BUPIVACAINE-EPINEPHRINE 0.25% -1:200000 IJ SOLN
INTRAMUSCULAR | Status: DC | PRN
  Administered 2018-01-09: 30 mL

## 2018-01-09 MED ORDER — LACTATED RINGERS IR SOLN
Status: DC | PRN
  Administered 2018-01-09: 1000 mL

## 2018-01-09 MED ORDER — ONDANSETRON HCL 4 MG/2ML IJ SOLN
INTRAMUSCULAR | Status: DC | PRN
  Administered 2018-01-09: 4 mg via INTRAVENOUS

## 2018-01-09 MED ORDER — ROCURONIUM BROMIDE 10 MG/ML (PF) SYRINGE
PREFILLED_SYRINGE | INTRAVENOUS | Status: DC | PRN
  Administered 2018-01-09: 50 mg via INTRAVENOUS

## 2018-01-09 MED ORDER — METOCLOPRAMIDE HCL 5 MG/ML IJ SOLN: 10.0000 mg | Freq: Once | INTRAMUSCULAR | Status: DC | PRN

## 2018-01-09 MED ORDER — MEPERIDINE HCL 50 MG/ML IJ SOLN: 6.2500 mg | INTRAMUSCULAR | Status: DC | PRN

## 2018-01-09 MED ORDER — MIDAZOLAM HCL 2 MG/2ML IJ SOLN
INTRAMUSCULAR | Status: AC
  Filled 2018-01-09: qty 2

## 2018-01-09 MED ORDER — GLYCOPYRROLATE 0.2 MG/ML IV SOSY
PREFILLED_SYRINGE | INTRAVENOUS | Status: DC | PRN
  Administered 2018-01-09: .1 mg via INTRAVENOUS

## 2018-01-09 MED ORDER — ACETAMINOPHEN 500 MG PO TABS
1000.0000 mg | ORAL_TABLET | ORAL | Status: AC
  Administered 2018-01-09: 1000 mg via ORAL
  Filled 2018-01-09: qty 2

## 2018-01-09 MED ORDER — DEXAMETHASONE SODIUM PHOSPHATE 10 MG/ML IJ SOLN
INTRAMUSCULAR | Status: DC | PRN
  Administered 2018-01-09: 10 mg via INTRAVENOUS

## 2018-01-09 MED ORDER — SODIUM CHLORIDE 0.9 % IJ SOLN
INTRAMUSCULAR | Status: AC
Start: 2018-01-09 — End: ?
  Filled 2018-01-09: qty 10

## 2018-01-09 MED ORDER — PHENYLEPHRINE 40 MCG/ML (10ML) SYRINGE FOR IV PUSH (FOR BLOOD PRESSURE SUPPORT)
PREFILLED_SYRINGE | INTRAVENOUS | Status: AC
Start: 2018-01-09 — End: ?
  Filled 2018-01-09: qty 10

## 2018-01-09 MED ORDER — LIDOCAINE 2% (20 MG/ML) 5 ML SYRINGE
INTRAMUSCULAR | Status: DC | PRN
  Administered 2018-01-09: 1.5 mg/kg/h via INTRAVENOUS

## 2018-01-09 MED ORDER — SODIUM CHLORIDE 0.9% FLUSH: 3.0000 mL | Freq: Two times a day (BID) | INTRAVENOUS | Status: DC

## 2018-01-09 MED ORDER — DEXAMETHASONE SODIUM PHOSPHATE 10 MG/ML IJ SOLN
INTRAMUSCULAR | Status: AC
  Filled 2018-01-09: qty 1

## 2018-01-09 MED ORDER — FENTANYL CITRATE (PF) 100 MCG/2ML IJ SOLN
25.0000 ug | INTRAMUSCULAR | Status: DC | PRN
  Administered 2018-01-09: 50 ug via INTRAVENOUS
  Administered 2018-01-09 (×2): 25 ug via INTRAVENOUS

## 2018-01-09 MED ORDER — CHLORHEXIDINE GLUCONATE 4 % EX LIQD: 60.0000 mL | Freq: Once | CUTANEOUS | Status: DC

## 2018-01-09 MED ORDER — LACTATED RINGERS IV SOLN
INTRAVENOUS | Status: DC
  Administered 2018-01-09 (×2): via INTRAVENOUS

## 2018-01-09 MED ORDER — BUPIVACAINE-EPINEPHRINE (PF) 0.25% -1:200000 IJ SOLN
INTRAMUSCULAR | Status: AC
  Filled 2018-01-09: qty 30

## 2018-01-09 MED ORDER — CEFAZOLIN SODIUM-DEXTROSE 2-4 GM/100ML-% IV SOLN
2.0000 g | INTRAVENOUS | Status: AC
  Administered 2018-01-09: 2 g via INTRAVENOUS
  Filled 2018-01-09: qty 100

## 2018-01-09 MED ORDER — FENTANYL CITRATE (PF) 250 MCG/5ML IJ SOLN
INTRAMUSCULAR | Status: AC
  Filled 2018-01-09: qty 5

## 2018-01-09 MED ORDER — GABAPENTIN 300 MG PO CAPS
300.0000 mg | ORAL_CAPSULE | ORAL | Status: AC
  Administered 2018-01-09: 300 mg via ORAL
  Filled 2018-01-09: qty 1

## 2018-01-09 MED ORDER — EPHEDRINE SULFATE 50 MG/ML IJ SOLN
INTRAMUSCULAR | Status: AC
Start: 2018-01-09 — End: ?
  Filled 2018-01-09: qty 1

## 2018-01-09 MED ORDER — FENTANYL CITRATE (PF) 100 MCG/2ML IJ SOLN
INTRAMUSCULAR | Status: AC
  Filled 2018-01-09: qty 2

## 2018-01-09 MED ORDER — KETAMINE HCL 10 MG/ML IJ SOLN
INTRAMUSCULAR | Status: DC | PRN
  Administered 2018-01-09 (×2): 20 mg via INTRAVENOUS

## 2018-01-09 MED ORDER — OXYCODONE HCL 5 MG PO TABS: 5.0000 mg | ORAL_TABLET | ORAL | Status: DC | PRN

## 2018-01-09 MED ORDER — LIDOCAINE 2% (20 MG/ML) 5 ML SYRINGE
INTRAMUSCULAR | Status: DC | PRN
  Administered 2018-01-09: 60 mg via INTRAVENOUS

## 2018-01-09 MED ORDER — 0.9 % SODIUM CHLORIDE (POUR BTL) OPTIME
TOPICAL | Status: DC | PRN
  Administered 2018-01-09: 1000 mL

## 2018-01-09 MED ORDER — PROPOFOL 10 MG/ML IV BOLUS
INTRAVENOUS | Status: DC | PRN
  Administered 2018-01-09: 150 mg via INTRAVENOUS

## 2018-01-09 MED ORDER — SODIUM CHLORIDE 0.9% FLUSH: 3.0000 mL | INTRAVENOUS | Status: DC | PRN

## 2018-01-09 MED ORDER — EPHEDRINE SULFATE-NACL 50-0.9 MG/10ML-% IV SOSY
PREFILLED_SYRINGE | INTRAVENOUS | Status: DC | PRN
  Administered 2018-01-09 (×2): 5 mg via INTRAVENOUS

## 2018-01-09 MED ORDER — SODIUM CHLORIDE 0.9 % IV SOLN: 250.0000 mL | INTRAVENOUS | Status: DC | PRN

## 2018-01-09 MED ORDER — DOCUSATE SODIUM 100 MG PO CAPS: 100.0000 mg | ORAL_CAPSULE | Freq: Two times a day (BID) | ORAL | 0 refills | Status: AC

## 2018-01-09 MED ORDER — FENTANYL CITRATE (PF) 100 MCG/2ML IJ SOLN: 25.0000 ug | INTRAMUSCULAR | Status: DC | PRN

## 2018-01-09 SURGICAL SUPPLY — 31 items
APPLIER CLIP ROT 10 11.4 M/L (STAPLE) ×2
CABLE HIGH FREQUENCY MONO STRZ (ELECTRODE) ×2 IMPLANT
CHLORAPREP W/TINT 26ML (MISCELLANEOUS) ×2 IMPLANT
CLIP APPLIE ROT 10 11.4 M/L (STAPLE) ×1 IMPLANT
COVER MAYO STAND STRL (DRAPES) IMPLANT
COVER SURGICAL LIGHT HANDLE (MISCELLANEOUS) ×2 IMPLANT
DECANTER SPIKE VIAL GLASS SM (MISCELLANEOUS) ×2 IMPLANT
DERMABOND ADVANCED (GAUZE/BANDAGES/DRESSINGS) ×1
DERMABOND ADVANCED .7 DNX12 (GAUZE/BANDAGES/DRESSINGS) ×1 IMPLANT
DRAPE C-ARM 42X120 X-RAY (DRAPES) IMPLANT
ELECT REM PT RETURN 15FT ADLT (MISCELLANEOUS) ×2 IMPLANT
GLOVE BIO SURGEON STRL SZ 6 (GLOVE) ×2 IMPLANT
GLOVE INDICATOR 6.5 STRL GRN (GLOVE) ×2 IMPLANT
GOWN STRL REUS W/TWL LRG LVL3 (GOWN DISPOSABLE) ×2 IMPLANT
GOWN STRL REUS W/TWL XL LVL3 (GOWN DISPOSABLE) ×4 IMPLANT
GRASPER SUT TROCAR 14GX15 (MISCELLANEOUS) ×2 IMPLANT
HEMOSTAT SNOW SURGICEL 2X4 (HEMOSTASIS) IMPLANT
KIT BASIN OR (CUSTOM PROCEDURE TRAY) ×2 IMPLANT
NEEDLE INSUFFLATION 14GA 120MM (NEEDLE) ×2 IMPLANT
POUCH SPECIMEN RETRIEVAL 10MM (ENDOMECHANICALS) ×2 IMPLANT
SCISSORS LAP 5X35 DISP (ENDOMECHANICALS) ×2 IMPLANT
SET CHOLANGIOGRAPH MIX (MISCELLANEOUS) IMPLANT
SET IRRIG TUBING LAPAROSCOPIC (IRRIGATION / IRRIGATOR) ×2 IMPLANT
SLEEVE XCEL OPT CAN 5 100 (ENDOMECHANICALS) ×4 IMPLANT
SUT MNCRL AB 4-0 PS2 18 (SUTURE) ×2 IMPLANT
TOWEL OR 17X26 10 PK STRL BLUE (TOWEL DISPOSABLE) ×2 IMPLANT
TOWEL OR NON WOVEN STRL DISP B (DISPOSABLE) IMPLANT
TRAY LAPAROSCOPIC (CUSTOM PROCEDURE TRAY) ×2 IMPLANT
TROCAR BLADELESS OPT 5 100 (ENDOMECHANICALS) ×2 IMPLANT
TROCAR XCEL 12X100 BLDLESS (ENDOMECHANICALS) ×2 IMPLANT
TUBING INSUF HEATED (TUBING) ×2 IMPLANT

## 2018-01-09 NOTE — Anesthesia Procedure Notes (Signed)
Procedure Name: Intubation Date/Time: 01/09/2018 12:50 PM Performed by: Mitzie Na, CRNA Pre-anesthesia Checklist: Patient identified, Emergency Drugs available, Suction available and Timeout performed Patient Re-evaluated:Patient Re-evaluated prior to induction Oxygen Delivery Method: Circle system utilized Preoxygenation: Pre-oxygenation with 100% oxygen Induction Type: IV induction Ventilation: Oral airway inserted - appropriate to patient size and Mask ventilation without difficulty Laryngoscope Size: Mac and 3 Grade View: Grade I Tube type: Oral Tube size: 7.0 mm Number of attempts: 1 Airway Equipment and Method: Stylet Placement Confirmation: ETT inserted through vocal cords under direct vision,  positive ETCO2 and breath sounds checked- equal and bilateral Secured at: 21 cm Tube secured with: Tape Dental Injury: Teeth and Oropharynx as per pre-operative assessment

## 2018-01-09 NOTE — Op Note (Signed)
Operative Note  Megan Murphy 62 y.o. female [id number]  01/09/2018  Surgeon: Clovis Riley MD  Assistant: none  Procedure performed: Laparoscopic Cholecystectomy  Preop diagnosis: biliary colic Post-op diagnosis/intraop findings: same  Specimens: gallbladder  EBL: minimal  Complications: none  Description of procedure: After obtaining informed consent the patient was brought to the operating room. Prophylactic antibiotics and subcutaneous heparin were administered. SCD's were applied. General endotracheal anesthesia was initiated and a formal time-out was performed. The abdomen was prepped and draped in the usual sterile fashion and the abdomen was entered using an infraumbilical veress needle after instilling the site with local. Insufflation to 67mmHg was obtained and gross inspection revealed no evidence of injury from our entry or other intraabdominal abnormalities. Two 1mm trocars were introduced in the right midclavicular and right anterior axillary lines under direct visualization and following infiltration with local. An 81mm trocar was placed in the epigastrium. The gallbladder was retracted cephalad and the infundibulum was retracted laterally. Omental adhesions to the gallbladder were carefully taken down with cautery and blunt dissection. A combination of hook electrocautery and blunt dissection was utilized to clear the peritoneum from the neck and cystic duct, circumferentially isolating the cystic artery and cystic duct and lifting the gallbladder from the cystic plate. The critical view of safety was achieved with the cystic artery, cystic duct, and liver bed visualized between them with no other structures. The artery had an anterior and posterior branch each of which was clipped with a single clip proximally and distally and divided as was the cystic duct with three clips on the proximal end. The gallbladder was dissected from the liver plate using electrocautery. Once  freed the gallbladder was placed in an endocatch bag and removed through the epigastric trocar site which had to be extended due to a very large stone within the gallbladder. A small amount of bleeding on the liver bed was controlled with cautery. Some bile had been spilled from the gallbladder during its dissection from the liver bed. This was aspirated and the right upper quadrant was irrigated copiously until the effluent was clear. Hemostasis was once again confirmed, and reinspection of the abdomen revealed no injuries. The clips were well opposed without any bile leak from the duct or the liver bed. The 55mm trocar site in the epigastrium was closed with two 0 vicryls in the fascia under direct visualization using a PMI device. The abdomen was desufflated and all trocars removed. The skin incisions were closed with running subcuticular monocryl and Dermabond. The patient was awakened, extubated and transported to the recovery room in stable condition.   All counts were correct at the completion of the case.

## 2018-01-09 NOTE — Transfer of Care (Signed)
Immediate Anesthesia Transfer of Care Note  Patient: Megan Murphy  Procedure(s) Performed: LAPAROSCOPIC CHOLECYSTECTOMY (N/A Abdomen)  Patient Location: PACU  Anesthesia Type:General  Level of Consciousness: awake, alert , oriented and patient cooperative  Airway & Oxygen Therapy: Patient Spontanous Breathing and Patient connected to face mask oxygen  Post-op Assessment: Report given to RN, Post -op Vital signs reviewed and stable and Patient moving all extremities  Post vital signs: Reviewed and stable  Last Vitals:  Vitals Value Taken Time  BP 151/89 01/09/2018  2:00 PM  Temp    Pulse 88 01/09/2018  2:03 PM  Resp 20 01/09/2018  2:03 PM  SpO2 97 % 01/09/2018  2:03 PM  Vitals shown include unvalidated device data.  Last Pain:  Vitals:   01/09/18 1133  TempSrc:   PainSc: 0-No pain      Patients Stated Pain Goal: 4 (80/88/11 0315)  Complications: No apparent anesthesia complications

## 2018-01-09 NOTE — Anesthesia Preprocedure Evaluation (Signed)
Anesthesia Evaluation  Patient identified by MRN, date of birth, ID band Patient awake    Reviewed: Allergy & Precautions, NPO status , Patient's Chart, lab work & pertinent test results  Airway Mallampati: II  TM Distance: >3 FB Neck ROM: Full    Dental no notable dental hx.    Pulmonary neg pulmonary ROS,    Pulmonary exam normal breath sounds clear to auscultation       Cardiovascular hypertension, Pt. on medications Normal cardiovascular exam Rhythm:Regular Rate:Normal     Neuro/Psych negative neurological ROS  negative psych ROS   GI/Hepatic negative GI ROS, Neg liver ROS,   Endo/Other  negative endocrine ROS  Renal/GU negative Renal ROS  negative genitourinary   Musculoskeletal negative musculoskeletal ROS (+)   Abdominal   Peds negative pediatric ROS (+)  Hematology negative hematology ROS (+)   Anesthesia Other Findings   Reproductive/Obstetrics negative OB ROS                             Anesthesia Physical Anesthesia Plan  ASA: II  Anesthesia Plan: General   Post-op Pain Management:    Induction: Intravenous  PONV Risk Score and Plan: 3 and Ondansetron, Dexamethasone, Midazolam and Treatment may vary due to age or medical condition  Airway Management Planned: Oral ETT  Additional Equipment:   Intra-op Plan:   Post-operative Plan: Extubation in OR  Informed Consent: I have reviewed the patients History and Physical, chart, labs and discussed the procedure including the risks, benefits and alternatives for the proposed anesthesia with the patient or authorized representative who has indicated his/her understanding and acceptance.   Dental advisory given  Plan Discussed with: CRNA  Anesthesia Plan Comments:         Anesthesia Quick Evaluation

## 2018-01-09 NOTE — H&P (Signed)
Megan Murphy DOB: 04/18/1956 Married / Language: Cleophus Molt / Race: White Female  History of Present Illness  Patient words: This is a very pleasant and otherwise relatively healthy 62 year old woman is referred for biliary colic. She has known about her gallstones for about 30 years. Over that period of time she's had 3 attacks of pain. The most recent one was a couple weeks ago and she will continue the middle of the night with pain extending from just under her ribs to just below the umbilicus with a sensation of a fire ball in the epigastrium. This was after eating french fries at dinner the night before. No associated nausea, emesis or fevers. She has undergone a right upper quadrant ultrasound that does demonstrate gallstones in March of this year; common bile duct 3.7 mm, increased hepatic parenchymal echogenicity but otherwise no abnormal findings. Prior surgery includes a C-section. She does not smoke. 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 About a year ago she had to wake up in the middle the night and take her husband to the emergency room and he had undergone emergency cholecystectomy. She is interested in cystectomy to preclude further attacks of pain as well as to avoid the situation suffered by her husband. Her daughter is an Magazine features editor at [employer].   Past Surgical History Cesarean Section - Multiple Oral Surgery  Diagnostic Studies History  Colonoscopy 5-10 years ago Mammogram within last year  Allergies No Known Drug Allergies [12/06/2017]: Allergies Reconciled  Medication History  Verapamil HCl (40MG  Tablet, Oral) Active. Clobetasol Propionate (0.05% Cream, External) Active. Medications Reconciled  Social History  Alcohol use Moderate alcohol use. Caffeine use Carbonated beverages, Coffee, Tea. No drug use Tobacco use Never smoker.  Family History  Cerebrovascular Accident Brother, Sister. Colon Polyps Brother,  Sister. Heart Disease Brother, Sister. Heart disease in female family member before age 23 Hypertension Brother. Ovarian Cancer Mother.  Pregnancy / Birth History  Age at menarche 69 years. Age of menopause 32-55 Gravida 2 Length (months) of breastfeeding 3-6 Maternal age 9-25 Para 3 Regular periods  Other Problems Cholelithiasis High blood pressure Hypercholesterolemia     Review of Systems  General Present- Fatigue. Not Present- Appetite Loss, Chills, Fever, Night Sweats, Weight Gain and Weight Loss. Skin Not Present- Change in Wart/Mole, Dryness, Hives, Jaundice, New Lesions, Non-Healing Wounds, Rash and Ulcer. HEENT Not Present- Earache, Hearing Loss, Hoarseness, Nose Bleed, Oral Ulcers, Ringing in the Ears, Seasonal Allergies, Sinus Pain, Sore Throat, Visual Disturbances, Wears glasses/contact lenses and Yellow Eyes. Respiratory Not Present- Bloody sputum, Chronic Cough, Difficulty Breathing, Snoring and Wheezing. Breast Not Present- Breast Mass, Breast Pain, Nipple Discharge and Skin Changes. Cardiovascular Not Present- Chest Pain, Difficulty Breathing Lying Down, Leg Cramps, Palpitations, Rapid Heart Rate, Shortness of Breath and Swelling of Extremities. Gastrointestinal Not Present- Abdominal Pain, Bloating, Bloody Stool, Change in Bowel Habits, Chronic diarrhea, Constipation, Difficulty Swallowing, Excessive gas, Gets full quickly at meals, Hemorrhoids, Indigestion, Nausea, Rectal Pain and Vomiting. Female Genitourinary Not Present- Frequency, Nocturia, Painful Urination, Pelvic Pain and Urgency. Musculoskeletal Not Present- Back Pain, Joint Pain, Joint Stiffness, Muscle Pain, Muscle Weakness and Swelling of Extremities. Neurological Present- Decreased Memory. Not Present- Fainting, Headaches, Numbness, Seizures, Tingling, Tremor, Trouble walking and Weakness. Psychiatric Not Present- Anxiety, Bipolar, Change in Sleep Pattern, Depression, Fearful and  Frequent crying. Endocrine Not Present- Cold Intolerance, Excessive Hunger, Hair Changes, Heat Intolerance, Hot flashes and New Diabetes. Hematology Not Present- Blood Thinners, Easy Bruising, Excessive bleeding, Gland problems, HIV and Persistent Infections.  Vitals   There were no vitals filed for this visit.      Physical Exam  The physical exam findings are as follows: Note:Gen: alert and well appearing Eye: extraocular motion intact, no scleral icterus ENT: moist mucus membranes, dentition intact Neck: no mass or thyromegaly Chest: unlabored respirations, symmetrical air entry, clear bilaterally CV: regular rate and rhythm, no pedal edema Abdomen: soft, nontender, nondistended. No mass or organomegaly MSK: strength symmetrical throughout, no deformity Neuro: grossly intact, normal gait Psych: normal mood and affect, appropriate insight Skin: warm and dry, no rash or lesion on limited exam    Assessment & Plan  BILIARY COLIC (R32.02) Story: I discussed with her the options of laparoscopic cholecystectomy versus nonsurgical management with observation. She is not interested in observing and prefers to pursue laparoscopic cholecystectomy. We discussed the surgery including the technical details and the risks of bleeding, infection, pain, scarring, intra-abdominal injury specifically to the common bile duct or bowel and sequelae, discussed risk of conversion to open surgery, as well as general risks of blood clots, heart attack, pneumonia, stroke and death. She is stressed understanding. Her questions were answered to her satisfaction. We will schedule her at her convenience

## 2018-01-09 NOTE — Interval H&P Note (Signed)
History and Physical Interval Note:  01/09/2018 11:47 AM  Megan Murphy  has presented today for surgery, with the diagnosis of Biliary Colic  The various methods of treatment have been discussed with the patient and family. After consideration of risks, benefits and other options for treatment, the patient has consented to  Procedure(s): LAPAROSCOPIC CHOLECYSTECTOMY (N/A) as a surgical intervention .  The patient's history has been reviewed, patient examined, no change in status, stable for surgery.  I have reviewed the patient's chart and labs.  Questions were answered to the patient's satisfaction.     [name] Rich Brave

## 2018-01-09 NOTE — Discharge Instructions (Signed)
LAPAROSCOPIC SURGERY: POST OP INSTRUCTIONS ° °###################################################################### ° °EAT °Gradually transition to a high fiber diet with a fiber supplement over the next few weeks after discharge.  Start with a pureed / full liquid diet (see below) ° °WALK °Walk an hour a day.  Control your pain to do that.   ° °CONTROL PAIN °Control pain so that you can walk, sleep, tolerate sneezing/coughing, go up/down stairs. ° °HAVE A BOWEL MOVEMENT DAILY °Keep your bowels regular to avoid problems.  OK to try a laxative to override constipation.  OK to use an antidairrheal to slow down diarrhea.  Call if not better after 2 tries ° °CALL IF YOU HAVE PROBLEMS/CONCERNS °Call if you are still struggling despite following these instructions. °Call if you have concerns not answered by these instructions ° °###################################################################### ° ° ° °1. DIET: Follow a light bland diet the first 24 hours after arrival home, such as soup, liquids, crackers, etc.  Be sure to include lots of fluids daily.  Avoid fast food or heavy meals as your are more likely to get nauseated.  Eat a low fat the next few days after surgery.   °2. Take your usually prescribed home medications unless otherwise directed. °3. PAIN CONTROL: °a. Pain is best controlled by a usual combination of three different methods TOGETHER: °i. Ice/Heat °ii. Over the counter pain medication °iii. Prescription pain medication °b. Most patients will experience some swelling and bruising around the incisions.  Ice packs or heating pads (30-60 minutes up to 6 times a day) will help. Use ice for the first few days to help decrease swelling and bruising, then switch to heat to help relax tight/sore spots and speed recovery.  Some people prefer to use ice alone, heat alone, alternating between ice & heat.  Experiment to what works for you.  Swelling and bruising can take several weeks to resolve.   °c. It is  helpful to take an over-the-counter pain medication regularly for the first few weeks.  Choose one of the following that works best for you: °i. Naproxen (Aleve, etc)  Two 220mg tabs twice a day °ii. Ibuprofen (Advil, etc) Three 200mg tabs four times a day (every meal & bedtime) °iii. Acetaminophen (Tylenol, etc) 500-650mg four times a day (every meal & bedtime) °d. A  prescription for pain medication (such as oxycodone, hydrocodone, etc) should be given to you upon discharge.  Take your pain medication as prescribed.  °i. If you are having problems/concerns with the prescription medicine (does not control pain, nausea, vomiting, rash, itching, etc), please call us [phone] to see if we need to switch you to a different pain medicine that will work better for you and/or control your side effect better. °ii. If you need a refill on your pain medication, please contact your pharmacy.  They will contact our office to request authorization. Prescriptions will not be filled after 5 pm or on week-ends. °4. Avoid getting constipated.  Between the surgery and the pain medications, it is common to experience some constipation.  Increasing fluid intake and taking a fiber supplement (such as Metamucil, Citrucel, FiberCon, MiraLax, etc) 1-2 times a day regularly will usually help prevent this problem from occurring.  A mild laxative (prune juice, Milk of Magnesia, MiraLax, etc) should be taken according to package directions if there are no bowel movements after 48 hours.   °5. Watch out for diarrhea.  If you have many loose bowel movements, simplify your diet to bland foods & liquids for   a few days.  Stop any stool softeners and decrease your fiber supplement.  Switching to mild anti-diarrheal medications (Kayopectate, Pepto Bismol) can help.  If this worsens or does not improve, please call us. °6. Wash / shower every day.  You may shower over the skin glue which is waterproof.  Continue to shower over incision(s) after  the dressing is off. °7. Skin glue will flake off after 2 weeks.  You may leave the incision open to air.  You may replace a dressing/Band-Aid to cover the incision for comfort if you wish.  °8. ACTIVITIES as tolerated:   °a. You may resume regular (light) daily activities beginning the next day--such as daily self-care, walking, climbing stairs--gradually increasing activities as tolerated.  If you can walk 30 minutes without difficulty, it is safe to try more intense activity such as jogging, treadmill, bicycling, low-impact aerobics, swimming, etc. °b. Save the most intensive and strenuous activity for last such as sit-ups, heavy lifting, contact sports, etc  Refrain from any heavy lifting or straining until you are off narcotics for pain control.   °c. DO NOT PUSH THROUGH PAIN.  Let pain be your guide: If it hurts to do something, don't do it.  Pain is your body warning you to avoid that activity for another week until the pain goes down. °d. You may drive when you are no longer taking prescription pain medication, you can comfortably wear a seatbelt, and you can safely maneuver your car and apply brakes. °e. You may have sexual intercourse when it is comfortable.  °9. FOLLOW UP in our office °a. Please call [facility] at [phone] to set up an appointment to see your surgeon in the office for a follow-up appointment approximately 2-3 weeks after your surgery. °b. Make sure that you call for this appointment the day you arrive home to insure a convenient appointment time. °10. IF YOU HAVE DISABILITY OR FAMILY LEAVE FORMS, BRING THEM TO THE OFFICE FOR PROCESSING.  DO NOT GIVE THEM TO YOUR DOCTOR. ° ° °WHEN TO CALL US [phone]: °1. Poor pain control °2. Reactions / problems with new medications (rash/itching, nausea, etc)  °3. Fever over 101.5 F (38.5 C) °4. Inability to urinate °5. Nausea and/or vomiting °6. Worsening swelling or bruising °7. Continued bleeding from incision. °8. Increased pain, redness, or  drainage from the incision ° ° The clinic staff is available to answer your questions during regular business hours (8:30am-5pm).  Please don’t hesitate to call and ask to speak to one of our nurses for clinical concerns.  ° If you have a medical emergency, go to the nearest emergency room or call 911. ° A surgeon from [facility] is always on call at the hospitals ° ° °[facility] °[address], [address], [place], [place]  [address] ? °MAIN: [phone] ? TOLL FREE: [phone] ?  °FAX [phone] °[email or web address] ° ° °

## 2018-01-09 NOTE — Anesthesia Postprocedure Evaluation (Signed)
Anesthesia Post Note  Patient: Megan Murphy  Procedure(s) Performed: LAPAROSCOPIC CHOLECYSTECTOMY (N/A Abdomen)     Patient location during evaluation: PACU Anesthesia Type: General Level of consciousness: awake and alert Pain management: pain level controlled Vital Signs Assessment: post-procedure vital signs reviewed and stable Respiratory status: spontaneous breathing, nonlabored ventilation, respiratory function stable and patient connected to nasal cannula oxygen Cardiovascular status: blood pressure returned to baseline and stable Postop Assessment: no apparent nausea or vomiting Anesthetic complications: no    Last Vitals:  Vitals:   01/09/18 1500 01/09/18 1515  BP: 139/86 139/85  Pulse: 78 77  Resp: 12 12  Temp:    SpO2: 97% 92%    Last Pain:  Vitals:   01/09/18 1535  TempSrc:   PainSc: 3                  Montez Hageman

## 2018-01-24 ENCOUNTER — Ambulatory Visit: Payer: Self-pay | Admitting: Surgery

## 2018-01-24 DIAGNOSIS — D1720 Benign lipomatous neoplasm of skin and subcutaneous tissue of unspecified limb: Secondary | ICD-10-CM | POA: Diagnosis not present

## 2018-01-24 NOTE — H&P (Signed)
Megan Murphy Documented: 01/24/2018 8:43 AM Location: [facility] Patient #: [id number] DOB: November 05, 1955 Married / Language: Megan Murphy / Race: White Female  History of Present Illness (Megan Mimbs A. Kae [name] MD; 01/24/2018 8:54 AM) Patient words: Follow-up status post laparoscopic cholecystectomy on May 22. Pathology showed chronic cholecystitis and stones. She says that she is still having some soreness, mostly when she goes to bed and tries to move left to right. Also had some exacerbation of constipation postop not unexpectedly. Denies any nausea or vomiting, no jaundice, eating okay, no issues at the incisions.  She also would like to discuss a lipoma on her left anterior shoulder. It has been there for 3 or 4 years. Recently it has started to increase in size and causing some discomfort with associated muscle spasms. She would like it excised.  The patient is a 62 year old female.   Allergies (Megan Murphy, Megan Murphy; 01/24/2018 8:43 AM) No Known Drug Allergies [12/06/2017]: Allergies Reconciled  Medication History (Megan Murphy, Megan Murphy; 01/24/2018 8:43 AM) Verapamil HCl (40MG Tablet, Oral) Active. Clobetasol Propionate (0.05% Cream, External) Active. Medications Reconciled     Review of Systems (Megan Murphy A. Kae [name] MD; 01/24/2018 8:54 AM) All other systems negative  Vitals (Megan Murphy RMA; 01/24/2018 8:43 AM) 01/24/2018 8:43 AM Weight: 170.4 lb Height: 63in Body Surface Area: 1.81 m Body Mass Index: 30.18 kg/m  Temp.: 97.62F  Pulse: 94 (Regular)  BP: 124/82 (Sitting, Left Arm, Standard)      Physical Exam (Megan Agresti A. Remer Couse MD; 01/24/2018 8:56 AM)  The physical exam findings are as follows: Note:Alert and well-appearing No scleral icterus, no jaundice Unlabored respirations, Abdomen soft and nontender. Incisions well-healed without any signs of infection On the left anterior shoulder there is a mobile nontender soft subcutaneous mass  proximally 5 cm in diameter consistent with lipoma    Assessment & Plan (Megan Murphy A. Kae [name] MD; 01/24/2018 9:47 AM)  BILIARY COLIC (S96.28) Story: Status post an uneventful laparoscopic cholecystectomy. Recovering well. She stopped taking pain medicine after the first week but is still having a little bit of soreness with specific movements.   LIPOMA OF SHOULDER (D17.20) Story: Increasing in size and causing some discomfort. She would like this excised. I discussed the procedure with her including risks of bleeding, infection, pain, scarring, recurrence. Questions answered to her satisfaction. We'll schedule under [facility]

## 2018-03-21 DIAGNOSIS — K080 Exfoliation of teeth due to systemic causes: Secondary | ICD-10-CM | POA: Diagnosis not present

## 2018-06-27 ENCOUNTER — Encounter: Payer: Self-pay | Admitting: Family Medicine

## 2018-06-27 ENCOUNTER — Ambulatory Visit: Payer: Federal, State, Local not specified - PPO | Admitting: Family Medicine

## 2018-06-27 VITALS — BP 146/87 | HR 66 | Temp 98.0°F | Resp 16 | Ht 63.0 in | Wt 171.0 lb

## 2018-06-27 DIAGNOSIS — E782 Mixed hyperlipidemia: Secondary | ICD-10-CM | POA: Diagnosis not present

## 2018-06-27 DIAGNOSIS — I10 Essential (primary) hypertension: Secondary | ICD-10-CM | POA: Diagnosis not present

## 2018-06-27 DIAGNOSIS — E785 Hyperlipidemia, unspecified: Secondary | ICD-10-CM

## 2018-06-27 MED ORDER — VERAPAMIL HCL ER 180 MG PO TBCR: EXTENDED_RELEASE_TABLET | ORAL | 3 refills | Status: DC

## 2018-06-27 NOTE — Progress Notes (Signed)
Patient ID: Megan Murphy, female    DOB: June 15, 1956  Age: 62 y.o. MRN: [id number]    Subjective:  Subjective  HPI Megan Murphy presents for f/u bp ,  No complaints.    Review of Systems  Constitutional: Negative for appetite change, diaphoresis, fatigue and unexpected weight change.  Eyes: Negative for pain, redness and visual disturbance.  Respiratory: Negative for cough, chest tightness, shortness of breath and wheezing.   Cardiovascular: Negative for chest pain, palpitations and leg swelling.  Endocrine: Negative for cold intolerance, heat intolerance, polydipsia, polyphagia and polyuria.  Genitourinary: Negative for difficulty urinating, dysuria and frequency.  Neurological: Negative for dizziness, light-headedness, numbness and headaches.    History Past Medical History:  Diagnosis Date  . Basal cell cancer     X 2;Dr Tonia Brooms  . History of migraine headaches   . Hyperlipidemia   . Hypertension     She has a past surgical history that includes dilation and curretage; Colonoscopy (2012); Excision basal cell carcinoma; Wisdom tooth extraction; Cesarean section; and Cholecystectomy (N/A, 01/09/2018).   Her family history includes Cancer in her maternal grandmother and mother; Colon polyps in her sister; Heart attack (age of onset: 9) in her brother; Hypertension in her brother and sister; Stroke (age of onset: 59) in her sister; Stroke (age of onset: 53) in her brother.She reports that she has never smoked. She has never used smokeless tobacco. She reports that she drinks alcohol. She reports that she does not use drugs.  Current Outpatient Medications on File Prior to Visit  Medication Sig Dispense Refill  . Calcium Carbonate-Vitamin D (CALTRATE 600+D PO) Take 1 tablet by mouth daily.    . clobetasol ointment (TEMOVATE) 3.79 % Apply 1 application topically daily as needed (lichen sclerosus).     . COLLAGEN PO Take 1 Dose by mouth daily. In powder form    .  HYDROcodone-acetaminophen (NORCO/VICODIN) 5-325 MG tablet Take 1 tablet by mouth every 6 (six) hours as needed for moderate pain. 28 tablet 0  . OVER THE COUNTER MEDICATION Take 1 Dose by mouth daily. Vitacup vitamin infused coffee    . OVER THE COUNTER MEDICATION Baobab otc vitamin powder Mix 2 teaspoons with liquid once daily    . Protein POWD Take 1 Dose by mouth daily.     No current facility-administered medications on file prior to visit.      Objective:  Objective  Physical Exam BP (!) 146/87 (BP Location: Right Arm, Patient Position: Sitting, Cuff Size: Small)   Pulse 66   Temp 98 F (36.7 C) (Oral)   Resp 16   Ht 5\' 3"  (1.6 m)   Wt 171 lb (77.6 kg)   SpO2 100%   BMI 30.29 kg/m  Wt Readings from Last 3 Encounters:  06/27/18 171 lb (77.6 kg)  01/09/18 169 lb (76.7 kg)  01/03/18 169 lb 6 oz (76.8 kg)     Lab Results  Component Value Date   WBC 5.9 01/03/2018   HGB 14.3 01/03/2018   HCT 42.8 01/03/2018   PLT 263 01/03/2018   GLUCOSE 96 01/03/2018   CHOL 260 (H) 11/08/2017   TRIG 78.0 11/08/2017   HDL 90.60 11/08/2017   LDLDIRECT 134.3 03/13/2012   LDLCALC 154 (H) 11/08/2017   ALT 16 01/03/2018   AST 17 01/03/2018   NA 143 01/03/2018   K 4.4 01/03/2018   CL 107 01/03/2018   CREATININE 0.63 01/03/2018   BUN 16 01/03/2018   CO2 26 01/03/2018  TSH 2.21 05/10/2017   HGBA1C 5.5 03/07/2011    No results found.   Assessment & Plan:  Plan  I have changed [name]. Pucillo's verapamil. I am also having her maintain her clobetasol ointment, OVER THE COUNTER MEDICATION, COLLAGEN PO, Protein, Calcium Carbonate-Vitamin D (CALTRATE 600+D PO), OVER THE COUNTER MEDICATION, and HYDROcodone-acetaminophen.  Meds ordered this encounter  Medications  . verapamil (CALAN-SR) 180 MG CR tablet    Sig: TAKE 1 TABLET BY MOUTH ATBEDTIME.    Dispense:  45 tablet    Refill:  3    Problem List Items Addressed This Visit      Unprioritized   Essential hypertension    .Well  controlled, no changes to meds. Encouraged heart healthy diet such as the DASH diet and exercise as tolerated.       Relevant Medications   verapamil (CALAN-SR) 180 MG CR tablet   Other Relevant Orders   Lipid panel   Comprehensive metabolic panel   HYPERLIPIDEMIA    Encouraged heart healthy diet, increase exercise, avoid trans fats, consider a krill oil cap daily      Relevant Medications   verapamil (CALAN-SR) 180 MG CR tablet    Other Visit Diagnoses    Hyperlipidemia, unspecified hyperlipidemia type    -  Primary   Relevant Medications   verapamil (CALAN-SR) 180 MG CR tablet   Other Relevant Orders   Lipid panel   Comprehensive metabolic panel      Follow-up: Return in about 3 months (around 09/27/2018), or if symptoms worsen or fail to improve, for hypertension.  Ann Held, DO

## 2018-06-27 NOTE — Patient Instructions (Signed)
DASH Eating Plan DASH stands for "Dietary Approaches to Stop Hypertension." The DASH eating plan is a healthy eating plan that has been shown to reduce high blood pressure (hypertension). It may also reduce your risk for type 2 diabetes, heart disease, and stroke. The DASH eating plan may also help with weight loss. What are tips for following this plan? General guidelines  Avoid eating more than 2,300 mg (milligrams) of salt (sodium) a day. If you have hypertension, you may need to reduce your sodium intake to 1,500 mg a day.  Limit alcohol intake to no more than 1 drink a day for nonpregnant women and 2 drinks a day for men. One drink equals 12 oz of beer, 5 oz of wine, or 1 oz of hard liquor.  Work with your health care provider to maintain a healthy body weight or to lose weight. Ask what an ideal weight is for you.  Get at least 30 minutes of exercise that causes your heart to beat faster (aerobic exercise) most days of the week. Activities may include walking, swimming, or biking.  Work with your health care provider or diet and nutrition specialist (dietitian) to adjust your eating plan to your individual calorie needs. Reading food labels  Check food labels for the amount of sodium per serving. Choose foods with less than 5 percent of the Daily Value of sodium. Generally, foods with less than 300 mg of sodium per serving fit into this eating plan.  To find whole grains, look for the word "whole" as the first word in the ingredient list. Shopping  Buy products labeled as "low-sodium" or "no salt added."  Buy fresh foods. Avoid canned foods and premade or frozen meals. Cooking  Avoid adding salt when cooking. Use salt-free seasonings or herbs instead of table salt or sea salt. Check with your health care provider or pharmacist before using salt substitutes.  Do not fry foods. Cook foods using healthy methods such as baking, boiling, grilling, and broiling instead.  Cook with  heart-healthy oils, such as olive, canola, soybean, or sunflower oil. Meal planning   Eat a balanced diet that includes: ? 5 or more servings of fruits and vegetables each day. At each meal, try to fill half of your plate with fruits and vegetables. ? Up to 6-8 servings of whole grains each day. ? Less than 6 oz of lean meat, poultry, or fish each day. A 3-oz serving of meat is about the same size as a deck of cards. One egg equals 1 oz. ? 2 servings of low-fat dairy each day. ? A serving of nuts, seeds, or beans 5 times each week. ? Heart-healthy fats. Healthy fats called Omega-3 fatty acids are found in foods such as flaxseeds and coldwater fish, like sardines, salmon, and mackerel.  Limit how much you eat of the following: ? Canned or prepackaged foods. ? Food that is high in trans fat, such as fried foods. ? Food that is high in saturated fat, such as fatty meat. ? Sweets, desserts, sugary drinks, and other foods with added sugar. ? Full-fat dairy products.  Do not salt foods before eating.  Try to eat at least 2 vegetarian meals each week.  Eat more home-cooked food and less restaurant, buffet, and fast food.  When eating at a restaurant, ask that your food be prepared with less salt or no salt, if possible. What foods are recommended? The items listed may not be a complete list. Talk with your dietitian about what   dietary choices are best for you. Grains Whole-grain or whole-wheat bread. Whole-grain or whole-wheat pasta. Brown rice. Oatmeal. Quinoa. Bulgur. Whole-grain and low-sodium cereals. Pita bread. Low-fat, low-sodium crackers. Whole-wheat flour tortillas. Vegetables Fresh or frozen vegetables (raw, steamed, roasted, or grilled). Low-sodium or reduced-sodium tomato and vegetable juice. Low-sodium or reduced-sodium tomato sauce and tomato paste. Low-sodium or reduced-sodium canned vegetables. Fruits All fresh, dried, or frozen fruit. Canned fruit in natural juice (without  added sugar). Meat and other protein foods Skinless chicken or turkey. Ground chicken or turkey. Pork with fat trimmed off. Fish and seafood. Egg whites. Dried beans, peas, or lentils. Unsalted nuts, nut butters, and seeds. Unsalted canned beans. Lean cuts of beef with fat trimmed off. Low-sodium, lean deli meat. Dairy Low-fat (1%) or fat-free (skim) milk. Fat-free, low-fat, or reduced-fat cheeses. Nonfat, low-sodium ricotta or cottage cheese. Low-fat or nonfat yogurt. Low-fat, low-sodium cheese. Fats and oils Soft margarine without trans fats. Vegetable oil. Low-fat, reduced-fat, or light mayonnaise and salad dressings (reduced-sodium). Canola, safflower, olive, soybean, and sunflower oils. Avocado. Seasoning and other foods Herbs. Spices. Seasoning mixes without salt. Unsalted popcorn and pretzels. Fat-free sweets. What foods are not recommended? The items listed may not be a complete list. Talk with your dietitian about what dietary choices are best for you. Grains Baked goods made with fat, such as croissants, muffins, or some breads. Dry pasta or rice meal packs. Vegetables Creamed or fried vegetables. Vegetables in a cheese sauce. Regular canned vegetables (not low-sodium or reduced-sodium). Regular canned tomato sauce and paste (not low-sodium or reduced-sodium). Regular tomato and vegetable juice (not low-sodium or reduced-sodium). Pickles. Olives. Fruits Canned fruit in a light or heavy syrup. Fried fruit. Fruit in cream or butter sauce. Meat and other protein foods Fatty cuts of meat. Ribs. Fried meat. Bacon. Sausage. Bologna and other processed lunch meats. Salami. Fatback. Hotdogs. Bratwurst. Salted nuts and seeds. Canned beans with added salt. Canned or smoked fish. Whole eggs or egg yolks. Chicken or turkey with skin. Dairy Whole or 2% milk, cream, and half-and-half. Whole or full-fat cream cheese. Whole-fat or sweetened yogurt. Full-fat cheese. Nondairy creamers. Whipped toppings.  Processed cheese and cheese spreads. Fats and oils Butter. Stick margarine. Lard. Shortening. Ghee. Bacon fat. Tropical oils, such as coconut, palm kernel, or palm oil. Seasoning and other foods Salted popcorn and pretzels. Onion salt, garlic salt, seasoned salt, table salt, and sea salt. Worcestershire sauce. Tartar sauce. Barbecue sauce. Teriyaki sauce. Soy sauce, including reduced-sodium. Steak sauce. Canned and packaged gravies. Fish sauce. Oyster sauce. Cocktail sauce. Horseradish that you find on the shelf. Ketchup. Mustard. Meat flavorings and tenderizers. Bouillon cubes. Hot sauce and Tabasco sauce. Premade or packaged marinades. Premade or packaged taco seasonings. Relishes. Regular salad dressings. Where to find more information:  National Heart, Lung, and Blood Institute: www.nhlbi.nih.gov  American Heart Association: www.heart.org Summary  The DASH eating plan is a healthy eating plan that has been shown to reduce high blood pressure (hypertension). It may also reduce your risk for type 2 diabetes, heart disease, and stroke.  With the DASH eating plan, you should limit salt (sodium) intake to 2,300 mg a day. If you have hypertension, you may need to reduce your sodium intake to 1,500 mg a day.  When on the DASH eating plan, aim to eat more fresh fruits and vegetables, whole grains, lean proteins, low-fat dairy, and heart-healthy fats.  Work with your health care provider or diet and nutrition specialist (dietitian) to adjust your eating plan to your individual   calorie needs. This information is not intended to replace advice given to you by your health care provider. Make sure you discuss any questions you have with your health care provider. Document Released: 07/27/2011 Document Revised: 07/31/2016 Document Reviewed: 07/31/2016 Elsevier Interactive Patient Education  2018 [employer] Inc.  

## 2018-06-27 NOTE — Assessment & Plan Note (Signed)
Encouraged heart healthy diet, increase exercise, avoid trans fats, consider a krill oil cap daily 

## 2018-06-27 NOTE — Assessment & Plan Note (Addendum)
Well controlled, no changes to meds. Encouraged heart healthy diet such as the DASH diet and exercise as tolerated.  °

## 2018-06-28 LAB — LIPID PANEL
Cholesterol: 256 mg/dL — ABNORMAL HIGH (ref 0–200)
HDL: 88.1 mg/dL (ref >39.00)
LDL Cholesterol: 149 mg/dL — ABNORMAL HIGH (ref 0–99)
NonHDL: 167.48
Total CHOL/HDL Ratio: 3
Triglycerides: 93 mg/dL (ref 0.0–149.0)
VLDL: 18.6 mg/dL (ref 0.0–40.0)

## 2018-06-28 LAB — COMPREHENSIVE METABOLIC PANEL
ALT: 15 U/L (ref 0–35)
AST: 17 U/L (ref 0–37)
Albumin: 4.1 g/dL (ref 3.5–5.2)
Alkaline Phosphatase: 93 U/L (ref 39–117)
BUN: 14 mg/dL (ref 6–23)
CO2: 30 mEq/L (ref 19–32)
Calcium: 9.6 mg/dL (ref 8.4–10.5)
Chloride: 101 mEq/L (ref 96–112)
Creatinine, Ser: 0.7 mg/dL (ref 0.40–1.20)
GFR: 90.01 mL/min (ref >60.00)
Glucose, Bld: 87 mg/dL (ref 70–99)
Potassium: 4.1 mEq/L (ref 3.5–5.1)
Sodium: 140 mEq/L (ref 135–145)
Total Bilirubin: 0.5 mg/dL (ref 0.2–1.2)
Total Protein: 7.1 g/dL (ref 6.0–8.3)

## 2018-07-10 DIAGNOSIS — Z6830 Body mass index (BMI) 30.0-30.9, adult: Secondary | ICD-10-CM | POA: Diagnosis not present

## 2018-07-10 DIAGNOSIS — Z01419 Encounter for gynecological examination (general) (routine) without abnormal findings: Secondary | ICD-10-CM | POA: Diagnosis not present

## 2018-07-10 DIAGNOSIS — Z1231 Encounter for screening mammogram for malignant neoplasm of breast: Secondary | ICD-10-CM | POA: Diagnosis not present

## 2018-07-10 DIAGNOSIS — Z8041 Family history of malignant neoplasm of ovary: Secondary | ICD-10-CM | POA: Diagnosis not present

## 2018-07-10 DIAGNOSIS — Z1151 Encounter for screening for human papillomavirus (HPV): Secondary | ICD-10-CM | POA: Diagnosis not present

## 2018-07-26 ENCOUNTER — Telehealth: Payer: Self-pay | Admitting: Medical

## 2018-07-26 ENCOUNTER — Ambulatory Visit: Payer: Federal, State, Local not specified - PPO | Admitting: Medical

## 2018-07-26 ENCOUNTER — Encounter: Payer: Self-pay | Admitting: Medical

## 2018-07-26 VITALS — BP 141/81 | HR 72 | Temp 98.1°F | Resp 16 | Ht 63.0 in | Wt 173.2 lb

## 2018-07-26 DIAGNOSIS — N3001 Acute cystitis with hematuria: Secondary | ICD-10-CM | POA: Diagnosis not present

## 2018-07-26 DIAGNOSIS — R350 Frequency of micturition: Secondary | ICD-10-CM

## 2018-07-26 DIAGNOSIS — R319 Hematuria, unspecified: Secondary | ICD-10-CM

## 2018-07-26 LAB — POC URINALSYSI DIPSTICK (AUTOMATED)
Bilirubin, UA: NEGATIVE
Glucose, UA: NEGATIVE
Ketones, UA: NEGATIVE
Nitrite, UA: NEGATIVE
Protein, UA: NEGATIVE
Spec Grav, UA: 1.015 (ref 1.010–1.025)
Urobilinogen, UA: NEGATIVE E.U./dL — AB
pH, UA: 6 (ref 5.0–8.0)

## 2018-07-26 MED ORDER — NITROFURANTOIN MONOHYD MACRO 100 MG PO CAPS: 100.0000 mg | ORAL_CAPSULE | Freq: Two times a day (BID) | ORAL | 0 refills | Status: DC

## 2018-07-26 MED ORDER — PHENAZOPYRIDINE HCL 200 MG PO TABS: 200.0000 mg | ORAL_TABLET | Freq: Three times a day (TID) | ORAL | 0 refills | Status: DC | PRN

## 2018-07-26 NOTE — Patient Instructions (Addendum)
Your appear to have a urinary tract infection. I am prescribing macrobid antibiotic for the probable infection. Hydrate well. I am sending out a urine culture. During the interim if your signs and symptoms worsen rather than improving please notify us. We will notify your when the culture results are back.  Pyridium written for bladder pain.  Will put in future urine to check for blood post treatment.  Follow up in 7-10 days or as needed.

## 2018-07-26 NOTE — Telephone Encounter (Signed)
Future urine order placed to repeat in 10-14 days for blood.

## 2018-07-26 NOTE — Progress Notes (Signed)
Subjective:    Patient ID: Megan Murphy, female    DOB: [birth year], 62 y.o.   MRN: [id number]  HPI  Pt in today reporting urinary symptoms for 1 day.  Dysuria- mild. Frequent urination-yes Hesitancy-no Suprapubic pressure-yes Fever-no chills-no Nausea-no Vomiting-no CVA pain-no History of UTI- rare. Gross hematuria-small amount.  No hx of smoking.   Review of Systems  Constitutional: Negative for chills, fatigue and fever.  Respiratory: Negative for chest tightness, shortness of breath and wheezing.   Cardiovascular: Negative for chest pain and palpitations.  Gastrointestinal: Negative for abdominal pain.  Genitourinary: Positive for dysuria, frequency and urgency.  Musculoskeletal: Negative for back pain and neck pain.  Skin: Negative for rash.  Hematological: Negative for adenopathy. Does not bruise/bleed easily.    Past Medical History:  Diagnosis Date  . Basal cell cancer     X 2;Dr Tonia Brooms  . History of migraine headaches   . Hyperlipidemia   . Hypertension      Social History   Socioeconomic History  . Marital status: Married    Spouse name: Not on file  . Number of children: Not on file  . Years of education: Not on file  . Highest education level: Not on file  Occupational History  . Occupation: Psychologist, occupational: [employer]  Social Needs  . Financial resource strain: Not on file  . Food insecurity:    Worry: Not on file    Inability: Not on file  . Transportation needs:    Medical: Not on file    Non-medical: Not on file  Tobacco Use  . Smoking status: Never Smoker  . Smokeless tobacco: Never Used  Substance and Sexual Activity  . Alcohol use: Yes    Comment:  rarely  . Drug use: No  . Sexual activity: Yes  Lifestyle  . Physical activity:    Days per week: Not on file    Minutes per session: Not on file  . Stress: Not on file  Relationships  . Social connections:    Talks on phone: Not on file    Gets together:  Not on file    Attends religious service: Not on file    Active member of club or organization: Not on file    Attends meetings of clubs or organizations: Not on file    Relationship status: Not on file  . Intimate partner violence:    Fear of current or ex partner: Not on file    Emotionally abused: Not on file    Physically abused: Not on file    Forced sexual activity: Not on file  Other Topics Concern  . Not on file  Social History Narrative   Exercise-- no    Past Surgical History:  Procedure Laterality Date  . BASAL CELL CARCINOMA EXCISION      X 2  . CESAREAN SECTION      X 2; G 2 P 2  . CHOLECYSTECTOMY N/A 01/09/2018   Procedure: LAPAROSCOPIC CHOLECYSTECTOMY;  Surgeon: Clovis Riley, MD;  Location: [facility];  Service: General;  Laterality: N/A;  . COLONOSCOPY  2012   Dr Olevia Perches, negative  . dilation and curretage     Dr Dellis Filbert  . WISDOM TOOTH EXTRACTION      Family History  Problem Relation Age of Onset  . Cancer Mother        ovarian cancer  . Stroke Sister 70  . Colon polyps Sister   . Hypertension  Sister   . Stroke Brother 47  . Hypertension Brother        X 3  . Cancer Maternal Grandmother        bladder cancer  . Heart attack Brother 71  . Diabetes Neg Hx     Allergies  Allergen Reactions  . Lisinopril     cough    Current Outpatient Medications on File Prior to Visit  Medication Sig Dispense Refill  . Calcium Carbonate-Vitamin D (CALTRATE 600+D PO) Take 1 tablet by mouth daily.    . clobetasol ointment (TEMOVATE) 2.24 % Apply 1 application topically daily as needed (lichen sclerosus).     . COLLAGEN PO Take 1 Dose by mouth daily. In powder form    . HYDROcodone-acetaminophen (NORCO/VICODIN) 5-325 MG tablet Take 1 tablet by mouth every 6 (six) hours as needed for moderate pain. 28 tablet 0  . OVER THE COUNTER MEDICATION Take 1 Dose by mouth daily. Vitacup vitamin infused coffee    . OVER THE COUNTER MEDICATION Baobab otc vitamin powder Mix 2  teaspoons with liquid once daily    . Protein POWD Take 1 Dose by mouth daily.    . verapamil (CALAN-SR) 180 MG CR tablet TAKE 1 TABLET BY MOUTH ATBEDTIME. 45 tablet 3   No current facility-administered medications on file prior to visit.     BP (!) 141/81   Pulse 72   Temp 98.1 F (36.7 C) (Oral)   Resp 16   Ht 5\' 3"  (1.6 m)   Wt 173 lb 3.2 oz (78.6 kg)   SpO2 98%   BMI 30.68 kg/m       Objective:   Physical Exam  General Appearance- Not in acute distress.   Chest and Lung Exam Auscultation: Breath sounds:-Normal. Adventitious sounds:- No Adventitious sounds.  Cardiovascular Auscultation:Rythm - Regular. Heart Sounds -Normal heart sounds.  Abdomen Inspection:-Inspection Normal.  Palpation/Perucssion: Palpation and Percussion of the abdomen reveal- faint suprapubic Tender, No Rebound tenderness, No rigidity(Guarding) and No Palpable abdominal masses.  Liver:-Normal.  Spleen:- Normal.   Back- no cva tenderness.      Assessment & Plan:  Your appear to have a urinary tract infection. I am prescribing macrobid antibiotic for the probable infection. Hydrate well. I am sending out a urine culture. During the interim if your signs and symptoms worsen rather than improving please notify us. We will notify your when the culture results are back.  Pyridium written for bladder pain  Follow up in 7-10 days or as needed.

## 2018-07-28 LAB — URINE CULTURE
MICRO NUMBER:: 91463729
SPECIMEN QUALITY:: ADEQUATE

## 2018-09-26 DIAGNOSIS — K080 Exfoliation of teeth due to systemic causes: Secondary | ICD-10-CM | POA: Diagnosis not present

## 2019-02-10 ENCOUNTER — Other Ambulatory Visit: Payer: Self-pay | Admitting: Family Medicine

## 2019-02-10 DIAGNOSIS — I10 Essential (primary) hypertension: Secondary | ICD-10-CM

## 2019-02-11 ENCOUNTER — Telehealth: Payer: Self-pay | Admitting: Family Medicine

## 2019-02-11 ENCOUNTER — Other Ambulatory Visit: Payer: Self-pay | Admitting: Family Medicine

## 2019-02-11 DIAGNOSIS — R21 Rash and other nonspecific skin eruption: Secondary | ICD-10-CM

## 2019-02-11 MED ORDER — CLOBETASOL PROPIONATE 0.05 % EX OINT: 1.0000 "application " | TOPICAL_OINTMENT | Freq: Every day | CUTANEOUS | 1 refills | Status: AC | PRN

## 2019-02-11 NOTE — Telephone Encounter (Signed)
Please advise 

## 2019-02-11 NOTE — Telephone Encounter (Signed)
Medication Refill - Medication: clobetasol ointment (TEMOVATE) 0.05 %  Has the patient contacted their pharmacy? No - this is usually given through her dermatologist but she can't get in touch with them and wants to know if Dr. Carollee Herter can RX for her (Agent: If no, request that the patient contact the pharmacy for the refill.) (Agent: If yes, when and what did the pharmacy advise?)  Preferred Pharmacy (with phone number or street name):  [facility] [address] [place] [phone] (Phone) [phone] (Fax)     Agent: Please be advised that RX refills may take up to 3 business days. We ask that you follow-up with your pharmacy.

## 2019-02-11 NOTE — Telephone Encounter (Signed)
Refilled

## 2019-03-27 ENCOUNTER — Other Ambulatory Visit: Payer: Self-pay | Admitting: Family Medicine

## 2019-03-27 DIAGNOSIS — I10 Essential (primary) hypertension: Secondary | ICD-10-CM

## 2019-04-02 ENCOUNTER — Other Ambulatory Visit: Payer: Self-pay | Admitting: Family Medicine

## 2019-04-02 DIAGNOSIS — I10 Essential (primary) hypertension: Secondary | ICD-10-CM

## 2019-04-24 ENCOUNTER — Ambulatory Visit: Payer: Federal, State, Local not specified - PPO | Admitting: Family Medicine

## 2019-04-24 ENCOUNTER — Other Ambulatory Visit: Payer: Self-pay

## 2019-04-24 ENCOUNTER — Encounter: Payer: Self-pay | Admitting: Family Medicine

## 2019-04-24 VITALS — BP 124/80 | HR 75 | Temp 97.5°F | Resp 18 | Ht 63.0 in | Wt 174.8 lb

## 2019-04-24 DIAGNOSIS — R079 Chest pain, unspecified: Secondary | ICD-10-CM | POA: Diagnosis not present

## 2019-04-24 DIAGNOSIS — R202 Paresthesia of skin: Secondary | ICD-10-CM

## 2019-04-24 DIAGNOSIS — I10 Essential (primary) hypertension: Secondary | ICD-10-CM | POA: Diagnosis not present

## 2019-04-24 DIAGNOSIS — R200 Anesthesia of skin: Secondary | ICD-10-CM

## 2019-04-24 LAB — LIPID PANEL
Cholesterol: 233 mg/dL — ABNORMAL HIGH (ref 0–200)
HDL: 85 mg/dL (ref >39.00)
LDL Cholesterol: 128 mg/dL — ABNORMAL HIGH (ref 0–99)
NonHDL: 147.65
Total CHOL/HDL Ratio: 3
Triglycerides: 98 mg/dL (ref 0.0–149.0)
VLDL: 19.6 mg/dL (ref 0.0–40.0)

## 2019-04-24 LAB — COMPREHENSIVE METABOLIC PANEL
ALT: 18 U/L (ref 0–35)
AST: 17 U/L (ref 0–37)
Albumin: 3.7 g/dL (ref 3.5–5.2)
Alkaline Phosphatase: 97 U/L (ref 39–117)
BUN: 16 mg/dL (ref 6–23)
CO2: 32 mEq/L (ref 19–32)
Calcium: 9 mg/dL (ref 8.4–10.5)
Chloride: 103 mEq/L (ref 96–112)
Creatinine, Ser: 0.8 mg/dL (ref 0.40–1.20)
GFR: 72.4 mL/min (ref >60.00)
Glucose, Bld: 80 mg/dL (ref 70–99)
Potassium: 3.7 mEq/L (ref 3.5–5.1)
Sodium: 141 mEq/L (ref 135–145)
Total Bilirubin: 0.3 mg/dL (ref 0.2–1.2)
Total Protein: 6.5 g/dL (ref 6.0–8.3)

## 2019-04-24 MED ORDER — VERAPAMIL HCL ER 180 MG PO TBCR: 180.0000 mg | EXTENDED_RELEASE_TABLET | Freq: Every day | ORAL | 1 refills | Status: DC

## 2019-04-24 NOTE — Progress Notes (Signed)
Patient ID: SALENE CARDINAL, female    DOB: March 14, 1956  Age: 63 y.o. 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  Eyes: Negative for pain, redness and visual disturbance.  Respiratory: Negative for cough, chest tightness, shortness of breath and wheezing.   Cardiovascular: Positive for chest pain. Negative for palpitations and leg swelling.  Endocrine: Negative for cold intolerance, heat intolerance, polydipsia, polyphagia and polyuria.  Genitourinary: Negative for difficulty urinating, dysuria and frequency.  Neurological: Positive for numbness. Negative for dizziness, light-headedness and headaches.   ekg -- no change from previous ekg--  NS st changes  History Past Medical History:  Diagnosis Date  . Basal cell cancer     X 2;Dr Tonia Brooms  . History of migraine headaches   . Hyperlipidemia   . Hypertension     She has a past surgical history that includes dilation and curretage; Colonoscopy (2012); Excision basal cell carcinoma; Wisdom tooth extraction; Cesarean section; and Cholecystectomy (N/A, 01/09/2018).   Her family history includes Cancer in her maternal grandmother and mother; Colon polyps in her sister; Heart attack (age of onset: 41) in her brother; Hypertension in her brother and sister; Stroke (age of onset: 67) in her sister; Stroke (age of onset: 7) in her brother.She reports that she has never smoked. She has never used smokeless tobacco. She reports current alcohol use. She reports that she does not use drugs.  Current Outpatient Medications on File Prior to Visit  Medication Sig Dispense Refill  . Calcium Carbonate-Vitamin D (CALTRATE 600+D PO)  Take 1 tablet by mouth daily.    . clobetasol ointment (TEMOVATE) AB-123456789 % Apply 1 application topically daily as needed (lichen sclerosus). 30 g 1  . COLLAGEN PO Take 1 Dose by mouth daily. In powder form    . HYDROcodone-acetaminophen (NORCO/VICODIN) 5-325 MG tablet Take 1 tablet by mouth every 6 (six) hours as needed for moderate pain. 28 tablet 0  . OVER THE COUNTER MEDICATION Take 1 Dose by mouth daily. Vitacup vitamin infused coffee    . OVER THE COUNTER MEDICATION Baobab otc vitamin powder Mix 2 teaspoons with liquid once daily    . phenazopyridine (PYRIDIUM) 200 MG tablet Take 1 tablet (200 mg total) by mouth 3 (three) times daily as needed for pain. 10 tablet 0  . Protein POWD Take 1 Dose by mouth daily.    . nitrofurantoin, macrocrystal-monohydrate, (MACROBID) 100 MG capsule Take 1 capsule (100 mg total) by mouth 2 (two) times daily. (Patient not taking: Reported on 04/24/2019) 14 capsule 0   No current facility-administered medications on file prior to visit.      Objective:  Objective  Physical Exam Vitals signs reviewed.  Constitutional:      Appearance: She is well-developed.  HENT:     Head: Normocephalic and atraumatic.  Eyes:     Conjunctiva/sclera: Conjunctivae normal.  Neck:     Musculoskeletal: Normal range of motion and neck supple.     Thyroid: No thyromegaly.     Vascular: No carotid bruit or JVD.  Cardiovascular:     Rate and Rhythm: Normal rate and regular rhythm.  Heart sounds: Normal heart sounds. No murmur.  Pulmonary:     Effort: Pulmonary effort is normal. No respiratory distress.     Breath sounds: Normal breath sounds. No wheezing or rales.  Chest:     Chest wall: No tenderness.  Neurological:     Mental Status: She is alert and oriented to person, place, and time.     Comments: With flexion of both wrists L hand / fingers tingle     BP 124/80 (BP Location: Right Arm, Patient Position: Sitting, Cuff Size: Normal)   Pulse 75   Temp (!) 97.5 F  (36.4 C) (Temporal)   Resp 18   Ht 5\' 3"  (1.6 m)   Wt 174 lb 12.8 oz (79.3 kg)   SpO2 96%   BMI 30.96 kg/m  Wt Readings from Last 3 Encounters:  04/24/19 174 lb 12.8 oz (79.3 kg)  07/26/18 173 lb 3.2 oz (78.6 kg)  06/27/18 171 lb (77.6 kg)     Lab Results  Component Value Date   WBC 5.9 01/03/2018   HGB 14.3 01/03/2018   HCT 42.8 01/03/2018   PLT 263 01/03/2018   GLUCOSE 80 04/24/2019   CHOL 233 (H) 04/24/2019   TRIG 98.0 04/24/2019   HDL 85.00 04/24/2019   LDLDIRECT 134.3 03/13/2012   LDLCALC 128 (H) 04/24/2019   ALT 18 04/24/2019   AST 17 04/24/2019   NA 141 04/24/2019   K 3.7 04/24/2019   CL 103 04/24/2019   CREATININE 0.80 04/24/2019   BUN 16 04/24/2019   CO2 32 04/24/2019   TSH 2.21 05/10/2017   HGBA1C 5.5 03/07/2011   ekg--  Nsr, no acute changes No results found.   Assessment & Plan:  Plan  I have changed [name]. Philipps's verapamil. I am also having her maintain her OVER THE COUNTER MEDICATION, COLLAGEN PO, Protein, Calcium Carbonate-Vitamin D (CALTRATE 600+D PO), OVER THE COUNTER MEDICATION, HYDROcodone-acetaminophen, nitrofurantoin (macrocrystal-monohydrate), phenazopyridine, and clobetasol ointment.  Meds ordered this encounter  Medications  . verapamil (CALAN-SR) 180 MG CR tablet    Sig: Take 1 tablet (180 mg total) by mouth at bedtime.    Dispense:  90 tablet    Refill:  1    Pt needs f/u for further refills    Problem List Items Addressed This Visit      Unprioritized   Chest pain - Primary    ekg normal  Go to er if symptoms return       Relevant Orders   EKG 12-Lead (Completed)   ECHOCARDIOGRAM COMPLETE   Essential hypertension    Well controlled, no changes to meds. Encouraged heart healthy diet such as the DASH diet and exercise as tolerated.       Relevant Medications   verapamil (CALAN-SR) 180 MG CR tablet   Other Relevant Orders   Lipid panel (Completed)   Comprehensive metabolic panel (Completed)   ECHOCARDIOGRAM  COMPLETE   Numbness and tingling in left hand    Wrist splint  Consider hand surgeon          Follow-up: Return in about 6 months (around 10/22/2019), or if symptoms worsen or fail to improve, for annual exam, fasting.  Ann Held, DO

## 2019-04-24 NOTE — Assessment & Plan Note (Signed)
Wrist splint  Consider hand surgeon

## 2019-04-24 NOTE — Assessment & Plan Note (Signed)
ekg normal  Go to er if symptoms return

## 2019-04-24 NOTE — Assessment & Plan Note (Signed)
Well controlled, no changes to meds. Encouraged heart healthy diet such as the DASH diet and exercise as tolerated.  °

## 2019-04-24 NOTE — Patient Instructions (Signed)

## 2019-04-29 ENCOUNTER — Ambulatory Visit (HOSPITAL_BASED_OUTPATIENT_CLINIC_OR_DEPARTMENT_OTHER): Payer: Federal, State, Local not specified - PPO

## 2019-05-05 ENCOUNTER — Ambulatory Visit (HOSPITAL_BASED_OUTPATIENT_CLINIC_OR_DEPARTMENT_OTHER)
Admission: RE | Admit: 2019-05-05 | Discharge: 2019-05-05 | Disposition: A | Discharge status: Home / Self Care | Payer: Federal, State, Local not specified - PPO | Source: Ambulatory Visit | Attending: Family Medicine | Admitting: Family Medicine

## 2019-05-05 DIAGNOSIS — R079 Chest pain, unspecified: Secondary | ICD-10-CM | POA: Insufficient documentation

## 2019-05-05 DIAGNOSIS — I10 Essential (primary) hypertension: Secondary | ICD-10-CM | POA: Insufficient documentation

## 2019-05-05 NOTE — Progress Notes (Signed)
  Echocardiogram 2D Echocardiogram has been performed.  Megan Murphy 05/05/2019, 3:52 PM

## 2019-05-28 DIAGNOSIS — Z86018 Personal history of other benign neoplasm: Secondary | ICD-10-CM | POA: Diagnosis not present

## 2019-05-28 DIAGNOSIS — L821 Other seborrheic keratosis: Secondary | ICD-10-CM | POA: Diagnosis not present

## 2019-05-28 DIAGNOSIS — L820 Inflamed seborrheic keratosis: Secondary | ICD-10-CM | POA: Diagnosis not present

## 2019-05-28 DIAGNOSIS — Z85828 Personal history of other malignant neoplasm of skin: Secondary | ICD-10-CM | POA: Diagnosis not present

## 2019-05-28 DIAGNOSIS — D225 Melanocytic nevi of trunk: Secondary | ICD-10-CM | POA: Diagnosis not present

## 2019-10-23 ENCOUNTER — Other Ambulatory Visit: Payer: Self-pay

## 2019-10-24 ENCOUNTER — Other Ambulatory Visit: Payer: Self-pay

## 2019-10-24 ENCOUNTER — Ambulatory Visit (HOSPITAL_BASED_OUTPATIENT_CLINIC_OR_DEPARTMENT_OTHER)
Admission: RE | Admit: 2019-10-24 | Discharge: 2019-10-24 | Disposition: A | Discharge status: Home / Self Care | Payer: Federal, State, Local not specified - PPO | Source: Ambulatory Visit | Attending: Family Medicine | Admitting: Family Medicine

## 2019-10-24 ENCOUNTER — Ambulatory Visit (INDEPENDENT_AMBULATORY_CARE_PROVIDER_SITE_OTHER): Payer: Federal, State, Local not specified - PPO | Admitting: Family Medicine

## 2019-10-24 ENCOUNTER — Encounter: Payer: Self-pay | Admitting: Family Medicine

## 2019-10-24 VITALS — BP 120/76 | HR 69 | Temp 97.0°F | Resp 18 | Ht 63.0 in | Wt 177.8 lb

## 2019-10-24 DIAGNOSIS — M7989 Other specified soft tissue disorders: Secondary | ICD-10-CM | POA: Diagnosis not present

## 2019-10-24 DIAGNOSIS — R2232 Localized swelling, mass and lump, left upper limb: Secondary | ICD-10-CM | POA: Diagnosis not present

## 2019-10-24 DIAGNOSIS — I10 Essential (primary) hypertension: Secondary | ICD-10-CM

## 2019-10-24 DIAGNOSIS — Z0000 Encounter for general adult medical examination without abnormal findings: Secondary | ICD-10-CM | POA: Diagnosis not present

## 2019-10-24 LAB — CBC WITH DIFFERENTIAL/PLATELET
Basophils Absolute: 0 10*3/uL (ref 0.0–0.1)
Basophils Relative: 0.4 % (ref 0.0–3.0)
Eosinophils Absolute: 0.2 10*3/uL (ref 0.0–0.7)
Eosinophils Relative: 3.7 % (ref 0.0–5.0)
HCT: 41.7 % (ref 36.0–46.0)
Hemoglobin: 14.1 g/dL (ref 12.0–15.0)
Lymphocytes Relative: 34.9 % (ref 12.0–46.0)
Lymphs Abs: 1.9 10*3/uL (ref 0.7–4.0)
MCHC: 33.8 g/dL (ref 30.0–36.0)
MCV: 94.3 fl (ref 78.0–100.0)
Monocytes Absolute: 0.4 10*3/uL (ref 0.1–1.0)
Monocytes Relative: 7.1 % (ref 3.0–12.0)
Neutro Abs: 2.9 10*3/uL (ref 1.4–7.7)
Neutrophils Relative %: 53.9 % (ref 43.0–77.0)
Platelets: 278 10*3/uL (ref 150.0–400.0)
RBC: 4.42 Mil/uL (ref 3.87–5.11)
RDW: 12.3 % (ref 11.5–15.5)
WBC: 5.4 10*3/uL (ref 4.0–10.5)

## 2019-10-24 LAB — COMPREHENSIVE METABOLIC PANEL
ALT: 15 U/L (ref 0–35)
AST: 14 U/L (ref 0–37)
Albumin: 4 g/dL (ref 3.5–5.2)
Alkaline Phosphatase: 103 U/L (ref 39–117)
BUN: 11 mg/dL (ref 6–23)
CO2: 30 mEq/L (ref 19–32)
Calcium: 9.5 mg/dL (ref 8.4–10.5)
Chloride: 103 mEq/L (ref 96–112)
Creatinine, Ser: 0.63 mg/dL (ref 0.40–1.20)
GFR: 95.23 mL/min (ref >60.00)
Glucose, Bld: 90 mg/dL (ref 70–99)
Potassium: 3.9 mEq/L (ref 3.5–5.1)
Sodium: 140 mEq/L (ref 135–145)
Total Bilirubin: 0.4 mg/dL (ref 0.2–1.2)
Total Protein: 7 g/dL (ref 6.0–8.3)

## 2019-10-24 LAB — LIPID PANEL
Cholesterol: 258 mg/dL — ABNORMAL HIGH (ref 0–200)
HDL: 84.7 mg/dL (ref >39.00)
LDL Cholesterol: 156 mg/dL — ABNORMAL HIGH (ref 0–99)
NonHDL: 173.16
Total CHOL/HDL Ratio: 3
Triglycerides: 86 mg/dL (ref 0.0–149.0)
VLDL: 17.2 mg/dL (ref 0.0–40.0)

## 2019-10-24 LAB — TSH: TSH: 1.63 u[IU]/mL (ref 0.35–4.50)

## 2019-10-24 MED ORDER — VERAPAMIL HCL ER 180 MG PO TBCR: 180.0000 mg | EXTENDED_RELEASE_TABLET | Freq: Every day | ORAL | 1 refills | Status: DC

## 2019-10-24 NOTE — Progress Notes (Signed)
--Subjective:     Megan Murphy is a 64 y.o. female and is here for a comprehensive physical exam. The patient reports problems - soft tissue mass L shoulder that is causing pain.  Social History   Socioeconomic History  . Marital status: Married    Spouse name: Not on file  . Number of children: Not on file  . Years of education: Not on file  . Highest education level: Not on file  Occupational History  . Occupation: Psychologist, occupational: [employer]  Tobacco Use  . Smoking status: Never Smoker  . Smokeless tobacco: Never Used  Substance and Sexual Activity  . Alcohol use: Yes    Comment:  rarely  . Drug use: No  . Sexual activity: Yes  Other Topics Concern  . Not on file  Social History Narrative   Exercise-- no   Social Determinants of Health   Financial Resource Strain:   . Difficulty of Paying Living Expenses: Not on file  Food Insecurity:   . Worried About Charity fundraiser in the Last Year: Not on file  . Ran Out of Food in the Last Year: Not on file  Transportation Needs:   . Lack of Transportation (Medical): Not on file  . Lack of Transportation (Non-Medical): Not on file  Physical Activity:   . Days of Exercise per Week: Not on file  . Minutes of Exercise per Session: Not on file  Stress:   . Feeling of Stress : Not on file  Social Connections:   . Frequency of Communication with Friends and Family: Not on file  . Frequency of Social Gatherings with Friends and Family: Not on file  . Attends Religious Services: Not on file  . Active Member of Clubs or Organizations: Not on file  . Attends Archivist Meetings: Not on file  . Marital Status: Not on file  Intimate Partner Violence:   . Fear of Current or Ex-Partner: Not on file  . Emotionally Abused: Not on file  . Physically Abused: Not on file  . Sexually Abused: Not on file   Health Maintenance  Topic Date Due  . HIV Screening  02/09/1971  . MAMMOGRAM  04/06/2017  . PAP  SMEAR-Modifier  04/06/2018  . COLONOSCOPY  05/24/2020  . TETANUS/TDAP  01/11/2022  . INFLUENZA VACCINE  Completed  . Hepatitis C Screening  Completed    The following portions of the patient's history were reviewed and updated as appropriate:  She  has a past medical history of Basal cell cancer, History of migraine headaches, Hyperlipidemia, and Hypertension. She does not have any pertinent problems on file. She  has a past surgical history that includes dilation and curretage; Colonoscopy (2012); Excision basal cell carcinoma; Wisdom tooth extraction; Cesarean section; and Cholecystectomy (N/A, 01/09/2018). Her family history includes Cancer in her maternal grandmother and mother; Colon polyps in her sister; Heart attack (age of onset: 59) in her brother; Hypertension in her brother and sister; Stroke (age of onset: 39) in her sister; Stroke (age of onset: 50) in her brother. She  reports that she has never smoked. She has never used smokeless tobacco. She reports current alcohol use. She reports that she does not use drugs. She has a current medication list which includes the following prescription(s): calcium carbonate-vitamin d, clobetasol ointment, collagen, OVER THE COUNTER MEDICATION, OVER THE COUNTER MEDICATION, protein, and verapamil. Current Outpatient Medications on File Prior to Visit  Medication Sig Dispense Refill  .  Calcium Carbonate-Vitamin D (CALTRATE 600+D PO) Take 1 tablet by mouth daily.    . clobetasol ointment (TEMOVATE) [id number] % Apply 1 application topically daily as needed (lichen sclerosus). 30 g 1  . COLLAGEN PO Take 1 Dose by mouth daily. In powder form    . OVER THE COUNTER MEDICATION Take 1 Dose by mouth daily. Vitacup vitamin infused coffee    . OVER THE COUNTER MEDICATION Baobab otc vitamin powder Mix 2 teaspoons with liquid once daily    . Protein POWD Take 1 Dose by mouth daily.     No current facility-administered medications on file prior to visit.   She is  allergic to lisinopril..  Review of Systems Review of Systems  Constitutional: Negative for activity change, appetite change and fatigue.  HENT: Negative for hearing loss, congestion, tinnitus and ear discharge.  dentist q57m Eyes: Negative for visual disturbance (see optho q1y -- vision corrected to 20/20 with glasses).  Respiratory: Negative for cough, chest tightness and shortness of breath.   Cardiovascular: Negative for chest pain, palpitations and leg swelling.  Gastrointestinal: Negative for abdominal pain, diarrhea, constipation and abdominal distention.  Genitourinary: Negative for urgency, frequency, decreased urine volume and difficulty urinating.  Musculoskeletal: Negative for back pain, arthralgias and gait problem.  Skin: Negative for color change, pallor and rash.  Neurological: Negative for dizziness, light-headedness, numbness and headaches.  Hematological: Negative for adenopathy. Does not bruise/bleed easily.  Psychiatric/Behavioral: Negative for suicidal ideas, confusion, sleep disturbance, self-injury, dysphoric mood, decreased concentration and agitation.       Objective:    BP 120/76 (BP Location: Right Arm, Patient Position: Sitting, Cuff Size: Normal)   Pulse 69   Temp (!) 97 F (36.1 C) (Temporal)   Resp 18   Ht 5\' 3"  (1.6 m)   Wt 177 lb 12.8 oz (80.6 kg)   SpO2 96%   BMI 31.50 kg/m  General appearance: alert, cooperative, appears stated age and no distress Head: Normocephalic, without obvious abnormality, atraumatic Eyes: negative findings: lids and lashes normal, conjunctivae and sclerae normal and pupils equal, round, reactive to light and accomodation Ears: normal TM's and external ear canals both ears Neck: no adenopathy, no carotid bruit, no JVD, supple, symmetrical, trachea midline and thyroid not enlarged, symmetric, no tenderness/mass/nodules Back: symmetric, no curvature. ROM normal. No CVA tenderness. Lungs: clear to auscultation  bilaterally Breasts:gyn Heart: regular rate and rhythm, S1, S2 normal, no murmur, click, rub or gallop Abdomen: soft, non-tender; bowel sounds normal; no masses,  no organomegaly Pelvic: deferred--gyn Extremities: extremities normal, atraumatic, no cyanosis or edema Pulses: 2+ and symmetric Skin: Skin color, texture, turgor normal. No rashes or lesions---_ soft tissue mass L shoulder ant---  Tender  Lymph nodes: Cervical, supraclavicular, and axillary nodes normal. Neurologic: Alert and oriented X 3, normal strength and tone. Normal symmetric reflexes. Normal coordination and gait    Assessment:    Healthy female exam.      Plan:     ghm utd Check labs  See After Visit Summary for Counseling Recommendations    1. Essential hypertension Well controlled, no changes to meds. Encouraged heart healthy diet such as the DASH diet and exercise as tolerated.   - verapamil (CALAN-SR) 180 MG CR tablet; Take 1 tablet (180 mg total) by mouth at bedtime.  Dispense: 90 tablet; Refill: 1 - Lipid panel - CBC with Differential/Platelet - TSH - Comprehensive metabolic panel  2. Preventative health care See above  - Lipid panel - CBC with Differential/Platelet - TSH -  Comprehensive metabolic panel  3. Mass of soft tissue of left upper extremity Check [place]  Ortho vs surgery  - [place] LT UPPER EXTREM LTD SOFT TISSUE NON VASCULAR; Future

## 2019-10-24 NOTE — Patient Instructions (Signed)

## 2019-11-17 ENCOUNTER — Encounter: Payer: Self-pay | Admitting: Family Medicine

## 2019-11-18 NOTE — Telephone Encounter (Signed)
Ok to refer to gen surgery --- [place]  Dx lipoma

## 2019-11-19 ENCOUNTER — Other Ambulatory Visit: Payer: Self-pay

## 2019-11-19 DIAGNOSIS — D179 Benign lipomatous neoplasm, unspecified: Secondary | ICD-10-CM

## 2019-12-31 DIAGNOSIS — Z01419 Encounter for gynecological examination (general) (routine) without abnormal findings: Secondary | ICD-10-CM | POA: Diagnosis not present

## 2019-12-31 DIAGNOSIS — Z1231 Encounter for screening mammogram for malignant neoplasm of breast: Secondary | ICD-10-CM | POA: Diagnosis not present

## 2019-12-31 DIAGNOSIS — Z6830 Body mass index (BMI) 30.0-30.9, adult: Secondary | ICD-10-CM | POA: Diagnosis not present

## 2019-12-31 DIAGNOSIS — Z8041 Family history of malignant neoplasm of ovary: Secondary | ICD-10-CM | POA: Diagnosis not present

## 2019-12-31 LAB — HM MAMMOGRAPHY

## 2020-02-26 ENCOUNTER — Ambulatory Visit: Payer: Self-pay | Admitting: Surgery

## 2020-02-26 DIAGNOSIS — D1720 Benign lipomatous neoplasm of skin and subcutaneous tissue of unspecified limb: Secondary | ICD-10-CM | POA: Diagnosis not present

## 2020-02-26 NOTE — H&P (Signed)
Surgical Evaluation  Chief Complaint: subcutaneous mass  HPI: pleasant 64 year old woman who is known to me presents for management of a subcutaneous mass on the anterior left shoulder.  This has been present for many years.  It has increased in size.  We had met to plan excision of this a couple years ago but for various reasons she was not able to go through with the surgery.  At this point it is bothering her more and continues to increase in size and she wishes to proceed with excision.  Denies any other significant changes in her health.  Allergies  Allergen Reactions  . Lisinopril     cough    Past Medical History:  Diagnosis Date  . Basal cell cancer     X 2;Dr Tonia Brooms  . History of migraine headaches   . Hyperlipidemia   . Hypertension     Past Surgical History:  Procedure Laterality Date  . BASAL CELL CARCINOMA EXCISION      X 2  . CESAREAN SECTION      X 2; G 2 P 2  . CHOLECYSTECTOMY N/A 01/09/2018   Procedure: LAPAROSCOPIC CHOLECYSTECTOMY;  Surgeon: Clovis Riley, MD;  Location: [facility];  Service: General;  Laterality: N/A;  . COLONOSCOPY  2012   Dr Olevia Perches, negative  . dilation and curretage     Dr Dellis Filbert  . WISDOM TOOTH EXTRACTION      Family History  Problem Relation Age of Onset  . Cancer Mother        ovarian cancer  . Stroke Sister 1  . Colon polyps Sister   . Hypertension Sister   . Stroke Brother 33  . Hypertension Brother        X 3  . Cancer Maternal Grandmother        bladder cancer  . Heart attack Brother 68  . Diabetes Neg Hx     Social History   Socioeconomic History  . Marital status: Married    Spouse name: Not on file  . Number of children: Not on file  . Years of education: Not on file  . Highest education level: Not on file  Occupational History  . Occupation: Psychologist, occupational: [employer]  Tobacco Use  . Smoking status: Never Smoker  . Smokeless tobacco: Never Used  Vaping Use  . Vaping Use: Never used   Substance and Sexual Activity  . Alcohol use: Yes    Comment:  rarely  . Drug use: No  . Sexual activity: Yes  Other Topics Concern  . Not on file  Social History Narrative   Exercise-- no   Social Determinants of Health   Financial Resource Strain:   . Difficulty of Paying Living Expenses:   Food Insecurity:   . Worried About Charity fundraiser in the Last Year:   . Arboriculturist in the Last Year:   Transportation Needs:   . Film/video editor (Medical):   Marland Kitchen Lack of Transportation (Non-Medical):   Physical Activity:   . Days of Exercise per Week:   . Minutes of Exercise per Session:   Stress:   . Feeling of Stress :   Social Connections:   . Frequency of Communication with Friends and Family:   . Frequency of Social Gatherings with Friends and Family:   . Attends Religious Services:   . Active Member of Clubs or Organizations:   . Attends Archivist Meetings:   .  Marital Status:     Current Outpatient Medications on File Prior to Visit  Medication Sig Dispense Refill  . Calcium Carbonate-Vitamin D (CALTRATE 600+D PO) Take 1 tablet by mouth daily.    . clobetasol ointment (TEMOVATE) 1.02 % Apply 1 application topically daily as needed (lichen sclerosus). 30 g 1  . COLLAGEN PO Take 1 Dose by mouth daily. In powder form    . OVER THE COUNTER MEDICATION Take 1 Dose by mouth daily. Vitacup vitamin infused coffee    . OVER THE COUNTER MEDICATION Baobab otc vitamin powder Mix 2 teaspoons with liquid once daily    . Protein POWD Take 1 Dose by mouth daily.    . verapamil (CALAN-SR) 180 MG CR tablet Take 1 tablet (180 mg total) by mouth at bedtime. 90 tablet 1   No current facility-administered medications on file prior to visit.    Review of Systems: a complete, 10pt review of systems was completed with pertinent positives and negatives as documented in the HPI  Physical Exam:  Weight: 174.6 lb Height: 63in Body Surface Area: 1.83 m Body Mass  Index: 30.93 kg/m  Temp.: 98.38F  Pulse: 78 (Regular)  BP: 128/76(Sitting, Left Arm, Standard)  Alert and well-appearing Unlabored respirations On the left anterior shoulder there is a mobile, smooth, rubbery subcutaneous mass approximately 6 cm x 4 cm by palpation. Of note the patient did have an ultrasound last month confirming a 4 x 2 cm likely lipoma.   CBC Latest Ref Rng & Units 10/24/2019 01/03/2018 11/08/2017  WBC 4.0 - 10.5 K/uL 5.4 5.9 4.5  Hemoglobin 12.0 - 15.0 g/dL 14.1 14.3 14.1  Hematocrit 36 - 46 % 41.7 42.8 41.8  Platelets 150 - 400 K/uL 278.0 263 247.0    CMP Latest Ref Rng & Units 10/24/2019 04/24/2019 06/27/2018  Glucose 70 - 99 mg/dL 90 80 87  BUN 6 - 23 mg/dL _0 Creatinine 0.40 - 1.20 mg/dL 0.63 0.80 0.70  Sodium 135 - 145 mEq/L 140 141 140  Potassium 3.5 - 5.1 mEq/L 3.9 3.7 4.1  Chloride 96 - 112 mEq/L 103 103 101  CO2 19 - 32 mEq/L 30 32 30  Calcium 8.4 - 10.5 mg/dL 9.5 9.0 9.6  Total Protein 6.0 - 8.3 g/dL 7.0 6.5 7.1  Total Bilirubin 0.2 - 1.2 mg/dL 0.4 0.3 0.5  Alkaline Phos 39 - 117 U/L 103 97 93  AST 0 - 37 U/L _1 ALT 0 - 35 U/L _2 No results found for: INR, PROTIME  Imaging: No results found.   A/P:LIPOMA OF SHOULDER (D17.20) Story: Increasing in size and causing some discomfort. She would like this excised. I discussed the procedure with her including risks of bleeding, infection, pain, scarring, recurrence. Questions answered to her satisfaction. We'll schedule under [facility]    Patient Active Problem List   Diagnosis Date Noted  . Numbness and tingling in left hand 04/24/2019  . Chest pain 04/24/2019  . RUQ pain 11/08/2017  . Dizziness and giddiness 05/17/2015  . Essential hypertension 04/05/2010  . ARTHRALGIA 04/05/2010  . SKIN CANCER, HX OF 09/08/2008  . HYPERLIPIDEMIA 07/29/2007       Romana Juniper, MD [facility]  See AMION to contact appropriate on-call provider

## 2020-03-19 ENCOUNTER — Encounter: Payer: Self-pay | Admitting: Family Medicine

## 2020-03-19 ENCOUNTER — Other Ambulatory Visit: Payer: Self-pay

## 2020-03-19 ENCOUNTER — Ambulatory Visit: Payer: Federal, State, Local not specified - PPO | Admitting: Family Medicine

## 2020-03-19 VITALS — BP 170/93 | HR 85 | Temp 98.5°F | Resp 16 | Ht 63.0 in | Wt 175.0 lb

## 2020-03-19 DIAGNOSIS — F439 Reaction to severe stress, unspecified: Secondary | ICD-10-CM | POA: Diagnosis not present

## 2020-03-19 NOTE — Patient Instructions (Signed)
Managing Stress, Adult Feeling a certain amount of stress is normal. Stress helps our body and mind get ready to deal with the demands of life. Stress hormones can motivate you to do well at work and meet your responsibilities. However severe or long-lasting (chronic) stress can affect your mental and physical health. Chronic stress puts you at higher risk for anxiety, depression, and other health problems like digestive problems, muscle aches, heart disease, high blood pressure, and stroke. What are the causes? Common causes of stress include:  Demands from work, such as deadlines, feeling overworked, or having long hours.  Pressures at home, such as money issues, disagreements with a spouse, or parenting issues.  Pressures from major life changes, such as divorce, moving, loss of a loved one, or chronic illness. You may be at higher risk for stress-related problems if you do not get enough sleep, are in poor health, do not have emotional support, or have a mental health disorder like anxiety or depression. How to recognize stress Stress can make you:  Have trouble sleeping.  Feel sad, anxious, irritable, or overwhelmed.  Lose your appetite.  Overeat or want to eat unhealthy foods.  Want to use drugs or alcohol. Stress can also cause physical symptoms, such as:  Sore, tense muscles, especially in the shoulders and neck.  Headaches.  Trouble breathing.  A faster heart rate.  Stomach pain, nausea, or vomiting.  Diarrhea or constipation.  Trouble concentrating. Follow these instructions at home: Lifestyle  Identify the source of your stress and your reaction to it. See a therapist who can help you change your reactions.  When there are stressful events: ? Talk about it with family, friends, or co-workers. ? Try to think realistically about stressful events and not ignore them or overreact. ? Try to find the positives in a stressful situation and not focus on the  negatives. ? Cut back on responsibilities at work and home, if possible. Ask for help from friends or family members if you need it.  Find ways to cope with stress, such as: ? Meditation. ? Deep breathing. ? Yoga or tai chi. ? Progressive muscle relaxation. ? Doing art, playing music, or reading. ? Making time for fun activities. ? Spending time with family and friends.  Get support from family, friends, or spiritual resources. Eating and drinking  Eat a healthy diet. This includes: ? Eating foods that are high in fiber, such as beans, whole grains, and fresh fruits and vegetables. ? Limiting foods that are high in fat and processed sugars, such as fried and sweet foods.  Do not skip meals or overeat.  Drink enough fluid to keep your urine pale yellow. Alcohol use  Do not drink alcohol if: ? Your health care provider tells you not to drink. ? You are pregnant, may be pregnant, or are planning to become pregnant.  Drinking alcohol is a way some people try to ease their stress. This can be dangerous, so if you drink alcohol: ? Limit how much you use to:  0-1 drink a day for women.  0-2 drinks a day for men. ? Be aware of how much alcohol is in your drink. In the [place] one drink equals one 12 oz bottle of beer (355 mL), one 5 oz glass of wine (148 mL), or one 1 oz glass of hard liquor (44 mL). Activity   Include 30 minutes of exercise in your daily schedule. Exercise is a good stress reducer.  Include time in your day   for an activity that you find relaxing. Try taking a walk, going on a bike ride, reading a book, or listening to music.  Schedule your time in a way that lowers stress, and keep a consistent schedule. Prioritize what is most important to get done. General instructions  Get enough sleep. Try to go to sleep and get up at about the same time every day.  Take over-the-counter and prescription medicines only as told by your health care provider.  Do not use any  products that contain nicotine or tobacco, such as cigarettes, e-cigarettes, and chewing tobacco. If you need help quitting, ask your health care provider.  Do not use drugs or smoke to cope with stress.  Keep all follow-up visits as told by your health care provider. This is important. Where to find support  Talk with your health care provider about stress management or finding a support group.  Find a therapist to work with you on your stress management techniques. Contact a health care provider if:  Your stress symptoms get worse.  You are unable to manage your stress at home.  You are struggling to stop using drugs or alcohol. Get help right away if:  You may be a danger to yourself or others.  You have any thoughts of death or suicide. If you ever feel like you may hurt yourself or others, or have thoughts about taking your own life, get help right away. You can go to your nearest emergency department or call:  Your local emergency services (911 in the [place]  A suicide crisis helpline, such as the [place] at 786-499-0900. This is open 24 hours a day. Summary  Feeling a certain amount of stress is normal, but severe or long-lasting (chronic) stress can affect your mental and physical health.  Chronic stress can put you at higher risk for anxiety, depression, and other health problems like digestive problems, muscle aches, heart disease, high blood pressure, and stroke.  You may be at higher risk for stress-related problems if you do not get enough sleep, are in poor health, lack emotional support, or have a mental health disorder like anxiety or depression.  Identify the source of your stress and your reaction to it. Try talking about stressful events with family, friends, or co-workers, finding a coping method, or getting support from spiritual resources.  If you need more help, talk with your health care provider about finding a support group  or a mental health therapist. This information is not intended to replace advice given to you by your health care provider. Make sure you discuss any questions you have with your health care provider. Document Revised: 03/05/2019 Document Reviewed: 03/05/2019 Elsevier Patient Education  [place].

## 2020-03-19 NOTE — Progress Notes (Signed)
Patient ID: Megan Murphy, female    DOB: 1956-08-15  Age: 64 y.o. MRN: [id number]    Subjective:  Subjective  HPI Megan Murphy presents for severe stress at work and pt feels like she is being bullied at work.  Pt will retire in Nov Pt is crying and is having difficulty controlling her crying to discuss the problem.    Pt works at [employer] and people are quitting and that makes more work for her.    Review of Systems  Constitutional: Negative for activity change, appetite change, chills, diaphoresis, fatigue, fever and unexpected weight change.  Eyes: Negative for pain, redness and visual disturbance.  Respiratory: Negative for cough, chest tightness, shortness of breath and wheezing.   Cardiovascular: Negative for chest pain, palpitations and leg swelling.  Gastrointestinal: Negative for abdominal distention and abdominal pain.  Endocrine: Negative for cold intolerance, heat intolerance, polydipsia, polyphagia and polyuria.  Genitourinary: Negative for difficulty urinating, dyspareunia, dysuria, flank pain, frequency, genital sores, hematuria, menstrual problem, pelvic pain, urgency, vaginal discharge and vaginal pain.  Musculoskeletal: Negative for back pain.  Neurological: Negative for dizziness, light-headedness, numbness and headaches.  Psychiatric/Behavioral: Positive for decreased concentration, dysphoric mood and sleep disturbance. Negative for self-injury and suicidal ideas. The patient is nervous/anxious.     History Past Medical History:  Diagnosis Date  . Basal cell cancer     X 2;Dr Tonia Brooms  . History of migraine headaches   . Hyperlipidemia   . Hypertension     She has a past surgical history that includes dilation and curretage; Colonoscopy (2012); Excision basal cell carcinoma; Wisdom tooth extraction; Cesarean section; and Cholecystectomy (N/A, 01/09/2018).   Her family history includes Cancer in her maternal grandmother and mother; Colon polyps in her sister;  Heart attack (age of onset: 25) in her brother; Hypertension in her brother and sister; Stroke (age of onset: 36) in her sister; Stroke (age of onset: 50) in her brother.She reports that she has never smoked. She has never used smokeless tobacco. She reports current alcohol use. She reports that she does not use drugs.  Current Outpatient Medications on File Prior to Visit  Medication Sig Dispense Refill  . Calcium Carbonate-Vitamin D (CALTRATE 600+D PO) Take 1 tablet by mouth daily.    . clobetasol ointment (TEMOVATE) 7.62 % Apply 1 application topically daily as needed (lichen sclerosus). 30 g 1  . COLLAGEN PO Take 1 Dose by mouth daily. In powder form    . OVER THE COUNTER MEDICATION Take 1 Dose by mouth daily. Vitacup vitamin infused coffee    . OVER THE COUNTER MEDICATION Baobab otc vitamin powder Mix 2 teaspoons with liquid once daily    . Protein POWD Take 1 Dose by mouth daily.    . verapamil (CALAN-SR) 180 MG CR tablet Take 1 tablet (180 mg total) by mouth at bedtime. 90 tablet 1   No current facility-administered medications on file prior to visit.     Objective:  Objective  Physical Exam Vitals and nursing note reviewed.  Constitutional:      Appearance: She is well-developed.  HENT:     Head: Normocephalic and atraumatic.  Eyes:     Conjunctiva/sclera: Conjunctivae normal.  Neck:     Thyroid: No thyromegaly.     Vascular: No carotid bruit or JVD.  Cardiovascular:     Rate and Rhythm: Normal rate and regular rhythm.     Heart sounds: Normal heart sounds. No murmur heard.   Pulmonary:  Effort: Pulmonary effort is normal. No respiratory distress.     Breath sounds: Normal breath sounds. No wheezing or rales.  Chest:     Chest wall: No tenderness.  Musculoskeletal:     Cervical back: Normal range of motion and neck supple.  Neurological:     Mental Status: She is alert and oriented to person, place, and time.  Psychiatric:        Attention and Perception:  Attention and perception normal.        Mood and Affect: Mood is anxious. Affect is tearful.        Speech: Speech normal.        Behavior: Behavior normal.        Thought Content: Thought content normal.        Cognition and Memory: Cognition normal.    BP (!) 170/93 (BP Location: Left Arm, Patient Position: Sitting, Cuff Size: Small)   Pulse 85   Temp 98.5 F (36.9 C) (Oral)   Resp 16   Ht 5\' 3"  (1.6 m)   Wt 175 lb (79.4 kg)   SpO2 99%   BMI 31.00 kg/m  Wt Readings from Last 3 Encounters:  03/19/20 175 lb (79.4 kg)  10/24/19 177 lb 12.8 oz (80.6 kg)  04/24/19 174 lb 12.8 oz (79.3 kg)     Lab Results  Component Value Date   WBC 5.4 10/24/2019   HGB 14.1 10/24/2019   HCT 41.7 10/24/2019   PLT 278.0 10/24/2019   GLUCOSE 90 10/24/2019   CHOL 258 (H) 10/24/2019   TRIG 86.0 10/24/2019   HDL 84.70 10/24/2019   LDLDIRECT 134.3 03/13/2012   LDLCALC 156 (H) 10/24/2019   ALT 15 10/24/2019   AST 14 10/24/2019   NA 140 10/24/2019   K 3.9 10/24/2019   CL 103 10/24/2019   CREATININE 0.63 10/24/2019   BUN 11 10/24/2019   CO2 30 10/24/2019   TSH 1.63 10/24/2019   HGBA1C 5.5 03/07/2011    [place] LT UPPER EXTREM LTD SOFT TISSUE NON VASCULAR  Result Date: 10/25/2019 CLINICAL DATA:  Palpable soft tissue mass involving the anterior aspect of the left shoulder. EXAM: ULTRASOUND left UPPER EXTREMITY LIMITED TECHNIQUE: Ultrasound examination of the upper extremity soft tissues was performed in the area of clinical concern. COMPARISON:  None. FINDINGS: The patient's palpable abnormality corresponds to a 4 x 2 cm well-circumscribed and encapsulated appearing lesion in the deep subcutaneous fat. The echogenicity is very similar to the surrounding subcutaneous fat. No internal blood flow is demonstrated. IMPRESSION: 4 x 2 cm lesion in the subcutaneous fat is most likely a benign lipoma. Recommend clinical surveillance and reimaging with MRI if lesion changes on physical examination (enlarges or  becomes fixed or firm). Electronically Signed   By: Marijo Sanes M.D.   On: 10/25/2019 08:46     Assessment & Plan:  Plan  I am having [name]. Jamison maintain her OVER THE COUNTER MEDICATION, COLLAGEN PO, Protein, Calcium Carbonate-Vitamin D (CALTRATE 600+D PO), OVER THE COUNTER MEDICATION, clobetasol ointment, and verapamil.  No orders of the defined types were placed in this encounter.   Problem List Items Addressed This Visit    None    Visit Diagnoses    Reaction to severe stress    -  Primary   Relevant Orders   Ambulatory referral to Psychology    pt needed fmla paperwork for work to be filled out  Pt will seek counseling as well   Follow-up: Return in about 4  weeks (around 04/16/2020).  Ann Held, DO

## 2020-04-19 ENCOUNTER — Ambulatory Visit: Payer: Federal, State, Local not specified - PPO | Admitting: Family Medicine

## 2020-04-19 ENCOUNTER — Other Ambulatory Visit: Payer: Self-pay | Admitting: Family Medicine

## 2020-04-19 ENCOUNTER — Encounter: Payer: Self-pay | Admitting: Family Medicine

## 2020-04-19 ENCOUNTER — Other Ambulatory Visit: Payer: Self-pay

## 2020-04-19 VITALS — BP 120/70 | HR 72 | Temp 98.2°F | Resp 18 | Ht 63.0 in | Wt 177.4 lb

## 2020-04-19 DIAGNOSIS — I10 Essential (primary) hypertension: Secondary | ICD-10-CM

## 2020-04-19 DIAGNOSIS — M791 Myalgia, unspecified site: Secondary | ICD-10-CM | POA: Diagnosis not present

## 2020-04-19 DIAGNOSIS — E782 Mixed hyperlipidemia: Secondary | ICD-10-CM

## 2020-04-19 DIAGNOSIS — Z566 Other physical and mental strain related to work: Secondary | ICD-10-CM

## 2020-04-19 NOTE — Assessment & Plan Note (Signed)
Well controlled, no changes to meds. Encouraged heart healthy diet such as the DASH diet and exercise as tolerated.  °

## 2020-04-19 NOTE — Assessment & Plan Note (Signed)
Pt will be retired at the end of sept

## 2020-04-19 NOTE — Assessment & Plan Note (Signed)
Encouraged heart healthy diet, increase exercise, avoid trans fats, consider a krill oil cap daily 

## 2020-04-19 NOTE — Progress Notes (Signed)
Patient ID: Megan Murphy, female    DOB: 1956-05-15  Age: 64 y.o. MRN: [id number]    Subjective:  Subjective  HPI Megan Murphy presents for f/u stress/ anxiety.  She is doing much better and will be retired by the end of the month.  She also c/o pain in arms ----when she lifts something ---- otherwise it does not hurt   Review of Systems  Constitutional: Negative for appetite change, diaphoresis, fatigue and unexpected weight change.  Eyes: Negative for pain, redness and visual disturbance.  Respiratory: Negative for cough, chest tightness, shortness of breath and wheezing.   Cardiovascular: Negative for chest pain, palpitations and leg swelling.  Endocrine: Negative for cold intolerance, heat intolerance, polydipsia, polyphagia and polyuria.  Genitourinary: Negative for difficulty urinating, dysuria and frequency.  Neurological: Negative for dizziness, light-headedness, numbness and headaches.  Psychiatric/Behavioral: Negative for behavioral problems, confusion, decreased concentration, dysphoric mood, self-injury, sleep disturbance and suicidal ideas. The patient is not nervous/anxious.     History Past Medical History:  Diagnosis Date  . Basal cell cancer     X 2;Dr Megan Murphy  . History of migraine headaches   . Hyperlipidemia   . Hypertension     She has a past surgical history that includes dilation and curretage; Colonoscopy (2012); Excision basal cell carcinoma; Wisdom tooth extraction; Cesarean section; and Cholecystectomy (N/A, 01/09/2018).   Her family history includes Cancer in her maternal grandmother and mother; Colon polyps in her sister; Heart attack (age of onset: 72) in her brother; Hypertension in her brother and sister; Stroke (age of onset: 9) in her sister; Stroke (age of onset: 29) in her brother.She reports that she has never smoked. She has never used smokeless tobacco. She reports current alcohol use. She reports that she does not use drugs.  Current  Outpatient Medications on File Prior to Visit  Medication Sig Dispense Refill  . Calcium Carbonate-Vitamin D (CALTRATE 600+D PO) Take 1 tablet by mouth daily.    . clobetasol ointment (TEMOVATE) 6.38 % Apply 1 application topically daily as needed (lichen sclerosus). 30 g 1  . COLLAGEN PO Take 1 Dose by mouth daily. In powder form    . OVER THE COUNTER MEDICATION Take 1 Dose by mouth daily. Vitacup vitamin infused coffee    . OVER THE COUNTER MEDICATION Baobab otc vitamin powder Mix 2 teaspoons with liquid once daily    . Protein POWD Take 1 Dose by mouth daily.    . verapamil (CALAN-SR) 180 MG CR tablet Take 1 tablet (180 mg total) by mouth at bedtime. 90 tablet 1   No current facility-administered medications on file prior to visit.     Objective:  Objective  Physical Exam Vitals and nursing note reviewed.  Constitutional:      Appearance: She is well-developed.  HENT:     Head: Normocephalic and atraumatic.  Eyes:     Conjunctiva/sclera: Conjunctivae normal.  Neck:     Thyroid: No thyromegaly.     Vascular: No carotid bruit or JVD.  Cardiovascular:     Rate and Rhythm: Normal rate and regular rhythm.     Heart sounds: Normal heart sounds. No murmur heard.   Pulmonary:     Effort: Pulmonary effort is normal. No respiratory distress.     Breath sounds: Normal breath sounds. No wheezing or rales.  Chest:     Chest wall: No tenderness.  Musculoskeletal:     Cervical back: Normal range of motion and neck supple.  Neurological:  Mental Status: She is alert and oriented to person, place, and time.    BP 120/70 (BP Location: Right Arm, Patient Position: Sitting, Cuff Size: Normal)   Pulse 72   Temp 98.2 F (36.8 C) (Oral)   Resp 18   Ht 5\' 3"  (1.6 m)   Wt 177 lb 6.4 oz (80.5 kg)   SpO2 97%   BMI 31.42 kg/m  Wt Readings from Last 3 Encounters:  04/19/20 177 lb 6.4 oz (80.5 kg)  03/19/20 175 lb (79.4 kg)  10/24/19 177 lb 12.8 oz (80.6 kg)     Lab Results    Component Value Date   WBC 5.4 10/24/2019   HGB 14.1 10/24/2019   HCT 41.7 10/24/2019   PLT 278.0 10/24/2019   GLUCOSE 90 10/24/2019   CHOL 258 (H) 10/24/2019   TRIG 86.0 10/24/2019   HDL 84.70 10/24/2019   LDLDIRECT 134.3 03/13/2012   LDLCALC 156 (H) 10/24/2019   ALT 15 10/24/2019   AST 14 10/24/2019   NA 140 10/24/2019   K 3.9 10/24/2019   CL 103 10/24/2019   CREATININE 0.63 10/24/2019   BUN 11 10/24/2019   CO2 30 10/24/2019   TSH 1.63 10/24/2019   HGBA1C 5.5 03/07/2011    [place] LT UPPER EXTREM LTD SOFT TISSUE NON VASCULAR  Result Date: 10/25/2019 CLINICAL DATA:  Palpable soft tissue mass involving the anterior aspect of the left shoulder. EXAM: ULTRASOUND left UPPER EXTREMITY LIMITED TECHNIQUE: Ultrasound examination of the upper extremity soft tissues was performed in the area of clinical concern. COMPARISON:  None. FINDINGS: The patient's palpable abnormality corresponds to a 4 x 2 cm well-circumscribed and encapsulated appearing lesion in the deep subcutaneous fat. The echogenicity is very similar to the surrounding subcutaneous fat. No internal blood flow is demonstrated. IMPRESSION: 4 x 2 cm lesion in the subcutaneous fat is most likely a benign lipoma. Recommend clinical surveillance and reimaging with MRI if lesion changes on physical examination (enlarges or becomes fixed or firm). Electronically Signed   By: Megan Murphy M.D.   On: 10/25/2019 08:46     Assessment & Plan:  Plan  I am having Megan Murphy. Megan Murphy maintain her OVER THE COUNTER MEDICATION, COLLAGEN PO, Protein, Calcium Carbonate-Vitamin D (CALTRATE 600+D PO), OVER THE COUNTER MEDICATION, clobetasol ointment, and verapamil.  No orders of the defined types were placed in this encounter.   Problem List Items Addressed This Visit      Unprioritized   Essential hypertension - Primary    Well controlled, no changes to meds. Encouraged heart healthy diet such as the DASH diet and exercise as tolerated.        Relevant Orders   Lipid panel   Comprehensive metabolic panel   HYPERLIPIDEMIA    Encouraged heart healthy diet, increase exercise, avoid trans fats, consider a krill oil cap daily      Stress at work    Pt will be retired at the end of sept        Other Visit Diagnoses    Myalgia       Relevant Orders   Vitamin D (25 hydroxy)   Morbid obesity ([place])       Relevant Orders   Amb Ref to Medical Weight Management      Follow-up: No follow-ups on file.  Ann Held, DO    +

## 2020-04-19 NOTE — Patient Instructions (Signed)

## 2020-04-20 LAB — LIPID PANEL
Cholesterol: 251 mg/dL — ABNORMAL HIGH (ref <200)
HDL: 91 mg/dL (ref >=50)
LDL Cholesterol (Calc): 142 mg/dL (calc) — ABNORMAL HIGH
Non-HDL Cholesterol (Calc): 160 mg/dL (calc) — ABNORMAL HIGH (ref <130)
Total CHOL/HDL Ratio: 2.8 (calc) (ref <5.0)
Triglycerides: 81 mg/dL (ref <150)

## 2020-04-20 LAB — COMPREHENSIVE METABOLIC PANEL
AG Ratio: 1.3 (calc) (ref 1.0–2.5)
ALT: 20 U/L (ref 6–29)
AST: 17 U/L (ref 10–35)
Albumin: 3.9 g/dL (ref 3.6–5.1)
Alkaline phosphatase (APISO): 105 U/L (ref 37–153)
BUN: 16 mg/dL (ref 7–25)
CO2: 26 mmol/L (ref 20–32)
Calcium: 9.2 mg/dL (ref 8.6–10.4)
Chloride: 105 mmol/L (ref 98–110)
Creat: 0.79 mg/dL (ref 0.50–0.99)
Globulin: 2.9 g/dL (calc) (ref 1.9–3.7)
Glucose, Bld: 94 mg/dL (ref 65–99)
Potassium: 4.4 mmol/L (ref 3.5–5.3)
Sodium: 139 mmol/L (ref 135–146)
Total Bilirubin: 0.5 mg/dL (ref 0.2–1.2)
Total Protein: 6.8 g/dL (ref 6.1–8.1)

## 2020-04-20 LAB — VITAMIN D 25 HYDROXY (VIT D DEFICIENCY, FRACTURES): Vit D, 25-Hydroxy: 15 ng/mL — ABNORMAL LOW (ref 30–100)

## 2020-04-21 MED ORDER — VITAMIN D (ERGOCALCIFEROL) 1.25 MG (50000 UNIT) PO CAPS: 50000.0000 [IU] | ORAL_CAPSULE | ORAL | 0 refills | Status: DC

## 2020-04-21 NOTE — Addendum Note (Signed)
Addended byDamita Dunnings D on: 04/21/2020 02:08 PM   Modules accepted: Orders

## 2020-04-24 ENCOUNTER — Encounter (INDEPENDENT_AMBULATORY_CARE_PROVIDER_SITE_OTHER): Payer: Self-pay

## 2020-04-27 ENCOUNTER — Ambulatory Visit: Payer: Federal, State, Local not specified - PPO | Admitting: Professional

## 2020-05-31 DIAGNOSIS — L821 Other seborrheic keratosis: Secondary | ICD-10-CM | POA: Diagnosis not present

## 2020-05-31 DIAGNOSIS — Z85828 Personal history of other malignant neoplasm of skin: Secondary | ICD-10-CM | POA: Diagnosis not present

## 2020-05-31 DIAGNOSIS — D225 Melanocytic nevi of trunk: Secondary | ICD-10-CM | POA: Diagnosis not present

## 2020-05-31 DIAGNOSIS — L900 Lichen sclerosus et atrophicus: Secondary | ICD-10-CM | POA: Diagnosis not present

## 2020-07-28 ENCOUNTER — Telehealth: Payer: Self-pay | Admitting: Family Medicine

## 2020-07-28 NOTE — Telephone Encounter (Signed)
Increase verapamil 240 mg daily #30 ----- ov in next 2-3 weeks or sooner if symptoms persist or worsen

## 2020-07-28 NOTE — Telephone Encounter (Signed)
Spoke with patient. Pt states she was having elevated BP. Pt states Bps were around 151/101 and 160/104. Pt states no chest pain or dizziness but states pain over left eye. Pt states she started taking two pills of her BP medication on Monday night and her BP has come down to 137/86. Pt also states the pain over her left eye went away. Pt has appt on 08/02/20. Please advise

## 2020-07-28 NOTE — Telephone Encounter (Signed)
Patient called in reference to elevated blood pressure. Patient states she would like to know if she should refill vitamin d . Patient states her arm is stilling hurting. Patient also requested a colonoscopy.   Please Advise   Patient schedule for 08/02/2020

## 2020-07-28 NOTE — Telephone Encounter (Signed)
Pt called. Advised to continue medication and advised if any new sxs to call us or proceed to the ED

## 2020-08-02 ENCOUNTER — Other Ambulatory Visit: Payer: Self-pay

## 2020-08-02 ENCOUNTER — Ambulatory Visit: Payer: Federal, State, Local not specified - PPO | Admitting: Family Medicine

## 2020-08-02 ENCOUNTER — Encounter: Payer: Self-pay | Admitting: Family Medicine

## 2020-08-02 VITALS — BP 130/100 | HR 68 | Temp 98.1°F | Resp 18 | Ht 63.0 in | Wt 175.0 lb

## 2020-08-02 DIAGNOSIS — I10 Essential (primary) hypertension: Secondary | ICD-10-CM

## 2020-08-02 DIAGNOSIS — E559 Vitamin D deficiency, unspecified: Secondary | ICD-10-CM | POA: Diagnosis not present

## 2020-08-02 DIAGNOSIS — R0683 Snoring: Secondary | ICD-10-CM | POA: Insufficient documentation

## 2020-08-02 LAB — LIPID PANEL
Cholesterol: 242 mg/dL — ABNORMAL HIGH (ref 0–200)
HDL: 89.2 mg/dL (ref >39.00)
LDL Cholesterol: 137 mg/dL — ABNORMAL HIGH (ref 0–99)
NonHDL: 153.13
Total CHOL/HDL Ratio: 3
Triglycerides: 79 mg/dL (ref 0.0–149.0)
VLDL: 15.8 mg/dL (ref 0.0–40.0)

## 2020-08-02 LAB — COMPREHENSIVE METABOLIC PANEL
ALT: 23 U/L (ref 0–35)
AST: 16 U/L (ref 0–37)
Albumin: 3.9 g/dL (ref 3.5–5.2)
Alkaline Phosphatase: 117 U/L (ref 39–117)
BUN: 15 mg/dL (ref 6–23)
CO2: 29 mEq/L (ref 19–32)
Calcium: 8.8 mg/dL (ref 8.4–10.5)
Chloride: 103 mEq/L (ref 96–112)
Creatinine, Ser: 0.67 mg/dL (ref 0.40–1.20)
GFR: 92.33 mL/min (ref >60.00)
Glucose, Bld: 88 mg/dL (ref 70–99)
Potassium: 4.1 mEq/L (ref 3.5–5.1)
Sodium: 138 mEq/L (ref 135–145)
Total Bilirubin: 0.3 mg/dL (ref 0.2–1.2)
Total Protein: 6.7 g/dL (ref 6.0–8.3)

## 2020-08-02 LAB — VITAMIN D 25 HYDROXY (VIT D DEFICIENCY, FRACTURES): VITD: 29.51 ng/mL — ABNORMAL LOW (ref 30.00–100.00)

## 2020-08-02 MED ORDER — LOSARTAN POTASSIUM 50 MG PO TABS: 50.0000 mg | ORAL_TABLET | Freq: Every day | ORAL | 3 refills | Status: DC

## 2020-08-02 MED ORDER — VERAPAMIL HCL ER 180 MG PO TBCR: EXTENDED_RELEASE_TABLET | ORAL | 2 refills | Status: DC

## 2020-08-02 NOTE — Assessment & Plan Note (Addendum)
Poorly controlled will alter medications, encouraged DASH diet, minimize caffeine and obtain adequate sleep. Report concerning symptoms and follow up as directed and as needed con't inc dose verapamil and add losartan 50 mg  F/u 2-3 weeks

## 2020-08-02 NOTE — Progress Notes (Signed)
Patient ID: Megan Murphy, female    DOB: Jul 20, 1956  Age: 64 y.o. MRN: [id number]    Subjective:  Subjective  HPI [name] presents for f/u htn.  She was having headaches and her bp was running high so she doubled her bp med about a week ago and then started taking her husbands losartan 50 mg 2 days ago    No cp, no palp -- her headaches are better  She also c/o snoring and her husband c/o she keeps him awake.    Review of Systems  Constitutional: Negative for appetite change, diaphoresis, fatigue and unexpected weight change.  Eyes: Negative for pain, redness and visual disturbance.  Respiratory: Negative for cough, chest tightness, shortness of breath and wheezing.   Cardiovascular: Negative for chest pain, palpitations and leg swelling.  Endocrine: Negative for cold intolerance, heat intolerance, polydipsia, polyphagia and polyuria.  Genitourinary: Negative for difficulty urinating, dysuria and frequency.  Neurological: Negative for dizziness, light-headedness, numbness and headaches.    History Past Medical History:  Diagnosis Date  . Basal cell cancer     X 2;Dr Tonia Brooms  . History of migraine headaches   . Hyperlipidemia   . Hypertension     She has a past surgical history that includes dilation and curretage; Colonoscopy (2012); Excision basal cell carcinoma; Wisdom tooth extraction; Cesarean section; and Cholecystectomy (N/A, 01/09/2018).   Her family history includes Cancer in her maternal grandmother and mother; Colon polyps in her sister; Heart attack (age of onset: 17) in her brother; Hypertension in her brother and sister; Stroke (age of onset: 64) in her sister; Stroke (age of onset: 74) in her brother.She reports that she has never smoked. She has never used smokeless tobacco. She reports current alcohol use. She reports that she does not use drugs.  Current Outpatient Medications on File Prior to Visit  Medication Sig Dispense Refill  . Calcium  Carbonate-Vitamin D (CALTRATE 600+D PO) Take 1 tablet by mouth daily.    . clobetasol ointment (TEMOVATE) 0.86 % Apply 1 application topically daily as needed (lichen sclerosus). 30 g 1  . COLLAGEN PO Take 1 Dose by mouth daily. In powder form    . OVER THE COUNTER MEDICATION Take 1 Dose by mouth daily. Vitacup vitamin infused coffee    . OVER THE COUNTER MEDICATION Baobab otc vitamin powder Mix 2 teaspoons with liquid once daily    . Protein POWD Take 1 Dose by mouth daily.     No current facility-administered medications on file prior to visit.     Objective:  Objective  Physical Exam Vitals and nursing note reviewed.  Constitutional:      Appearance: She is well-developed and well-nourished.  HENT:     Head: Normocephalic and atraumatic.  Eyes:     Extraocular Movements: EOM normal.     Conjunctiva/sclera: Conjunctivae normal.  Neck:     Thyroid: No thyromegaly.     Vascular: No carotid bruit or JVD.  Cardiovascular:     Rate and Rhythm: Normal rate and regular rhythm.     Heart sounds: Normal heart sounds. No murmur heard.   Pulmonary:     Effort: Pulmonary effort is normal. No respiratory distress.     Breath sounds: Normal breath sounds. No wheezing or rales.  Chest:     Chest wall: No tenderness.  Musculoskeletal:        General: No edema.     Cervical back: Normal range of motion and neck supple.  Neurological:  Mental Status: She is alert and oriented to person, place, and time.  Psychiatric:        Mood and Affect: Mood and affect normal.    BP (!) 130/100 (BP Location: Left Arm, Patient Position: Sitting, Cuff Size: Normal)   Pulse 68   Temp 98.1 F (36.7 C) (Oral)   Resp 18   Ht 5\' 3"  (1.6 m)   Wt 175 lb (79.4 kg)   SpO2 98%   BMI 31.00 kg/m  Wt Readings from Last 3 Encounters:  08/02/20 175 lb (79.4 kg)  04/19/20 177 lb 6.4 oz (80.5 kg)  03/19/20 175 lb (79.4 kg)     Lab Results  Component Value Date   WBC 5.4 10/24/2019   HGB 14.1  10/24/2019   HCT 41.7 10/24/2019   PLT 278.0 10/24/2019   GLUCOSE 94 04/19/2020   CHOL 251 (H) 04/19/2020   TRIG 81 04/19/2020   HDL 91 04/19/2020   LDLDIRECT 134.3 03/13/2012   LDLCALC 142 (H) 04/19/2020   ALT 20 04/19/2020   AST 17 04/19/2020   NA 139 04/19/2020   K 4.4 04/19/2020   CL 105 04/19/2020   CREATININE 0.79 04/19/2020   BUN 16 04/19/2020   CO2 26 04/19/2020   TSH 1.63 10/24/2019   HGBA1C 5.5 03/07/2011    [place] LT UPPER EXTREM LTD SOFT TISSUE NON VASCULAR  Result Date: 10/25/2019 CLINICAL DATA:  Palpable soft tissue mass involving the anterior aspect of the left shoulder. EXAM: ULTRASOUND left UPPER EXTREMITY LIMITED TECHNIQUE: Ultrasound examination of the upper extremity soft tissues was performed in the area of clinical concern. COMPARISON:  None. FINDINGS: The patient's palpable abnormality corresponds to a 4 x 2 cm well-circumscribed and encapsulated appearing lesion in the deep subcutaneous fat. The echogenicity is very similar to the surrounding subcutaneous fat. No internal blood flow is demonstrated. IMPRESSION: 4 x 2 cm lesion in the subcutaneous fat is most likely a benign lipoma. Recommend clinical surveillance and reimaging with MRI if lesion changes on physical examination (enlarges or becomes fixed or firm). Electronically Signed   By: Marijo Sanes M.D.   On: 10/25/2019 08:46     Assessment & Plan:  Plan  I have discontinued Kristyne Woodring. [place]ald's Vitamin D (Ergocalciferol). I have also changed her verapamil. Additionally, I am having her start on losartan. Lastly, I am having her maintain her OVER THE COUNTER MEDICATION, COLLAGEN PO, Protein, Calcium Carbonate-Vitamin D (CALTRATE 600+D PO), OVER THE COUNTER MEDICATION, and clobetasol ointment.  Meds ordered this encounter  Medications  . verapamil (CALAN-SR) 180 MG CR tablet    Sig: 2 po qhs    Dispense:  180 tablet    Refill:  2  . losartan (COZAAR) 50 MG tablet    Sig: Take 1 tablet (50 mg total) by  mouth daily.    Dispense:  90 tablet    Refill:  3    Problem List Items Addressed This Visit      Unprioritized   Essential hypertension - Primary    Poorly controlled will alter medications, encouraged DASH diet, minimize caffeine and obtain adequate sleep. Report concerning symptoms and follow up as directed and as needed con't inc dose verapamil and add losartan 50 mg  F/u 2-3 weeks       Relevant Medications   verapamil (CALAN-SR) 180 MG CR tablet   losartan (COZAAR) 50 MG tablet   Other Relevant Orders   Lipid panel   Comprehensive metabolic panel   Vitamin D (25  hydroxy)   Snoring    Refer to neuro for sleep study      Relevant Orders   Ambulatory referral to Neurology   Vitamin D deficiency   Relevant Orders   Vitamin D (25 hydroxy)      Follow-up: Return in about 3 weeks (around 08/23/2020), or if symptoms worsen or fail to improve, for hypertension.  Ann Held, DO

## 2020-08-02 NOTE — Assessment & Plan Note (Signed)
Refer to neuro for sleep study

## 2020-08-02 NOTE — Patient Instructions (Signed)
DASH Eating Plan DASH stands for "Dietary Approaches to Stop Hypertension." The DASH eating plan is a healthy eating plan that has been shown to reduce high blood pressure (hypertension). It may also reduce your risk for type 2 diabetes, heart disease, and stroke. The DASH eating plan may also help with weight loss. What are tips for following this plan?  General guidelines  Avoid eating more than 2,300 mg (milligrams) of salt (sodium) a day. If you have hypertension, you may need to reduce your sodium intake to 1,500 mg a day.  Limit alcohol intake to no more than 1 drink a day for nonpregnant women and 2 drinks a day for men. One drink equals 12 oz of beer, 5 oz of wine, or 1 oz of hard liquor.  Work with your health care provider to maintain a healthy body weight or to lose weight. Ask what an ideal weight is for you.  Get at least 30 minutes of exercise that causes your heart to beat faster (aerobic exercise) most days of the week. Activities may include walking, swimming, or biking.  Work with your health care provider or diet and nutrition specialist (dietitian) to adjust your eating plan to your individual calorie needs. Reading food labels   Check food labels for the amount of sodium per serving. Choose foods with less than 5 percent of the Daily Value of sodium. Generally, foods with less than 300 mg of sodium per serving fit into this eating plan.  To find whole grains, look for the word "whole" as the first word in the ingredient list. Shopping  Buy products labeled as "low-sodium" or "no salt added."  Buy fresh foods. Avoid canned foods and premade or frozen meals. Cooking  Avoid adding salt when cooking. Use salt-free seasonings or herbs instead of table salt or sea salt. Check with your health care provider or pharmacist before using salt substitutes.  Do not fry foods. Cook foods using healthy methods such as baking, boiling, grilling, and broiling instead.  Cook with  heart-healthy oils, such as olive, canola, soybean, or sunflower oil. Meal planning  Eat a balanced diet that includes: ? 5 or more servings of fruits and vegetables each day. At each meal, try to fill half of your plate with fruits and vegetables. ? Up to 6-8 servings of whole grains each day. ? Less than 6 oz of lean meat, poultry, or fish each day. A 3-oz serving of meat is about the same size as a deck of cards. One egg equals 1 oz. ? 2 servings of low-fat dairy each day. ? A serving of nuts, seeds, or beans 5 times each week. ? Heart-healthy fats. Healthy fats called Omega-3 fatty acids are found in foods such as flaxseeds and coldwater fish, like sardines, salmon, and mackerel.  Limit how much you eat of the following: ? Canned or prepackaged foods. ? Food that is high in trans fat, such as fried foods. ? Food that is high in saturated fat, such as fatty meat. ? Sweets, desserts, sugary drinks, and other foods with added sugar. ? Full-fat dairy products.  Do not salt foods before eating.  Try to eat at least 2 vegetarian meals each week.  Eat more home-cooked food and less restaurant, buffet, and fast food.  When eating at a restaurant, ask that your food be prepared with less salt or no salt, if possible. What foods are recommended? The items listed may not be a complete list. Talk with your dietitian about   what dietary choices are best for you. Grains Whole-grain or whole-wheat bread. Whole-grain or whole-wheat pasta. Brown rice. Oatmeal. Quinoa. Bulgur. Whole-grain and low-sodium cereals. Pita bread. Low-fat, low-sodium crackers. Whole-wheat flour tortillas. Vegetables Fresh or frozen vegetables (raw, steamed, roasted, or grilled). Low-sodium or reduced-sodium tomato and vegetable juice. Low-sodium or reduced-sodium tomato sauce and tomato paste. Low-sodium or reduced-sodium canned vegetables. Fruits All fresh, dried, or frozen fruit. Canned fruit in natural juice (without  added sugar). Meat and other protein foods Skinless chicken or turkey. Ground chicken or turkey. Pork with fat trimmed off. Fish and seafood. Egg whites. Dried beans, peas, or lentils. Unsalted nuts, nut butters, and seeds. Unsalted canned beans. Lean cuts of beef with fat trimmed off. Low-sodium, lean deli meat. Dairy Low-fat (1%) or fat-free (skim) milk. Fat-free, low-fat, or reduced-fat cheeses. Nonfat, low-sodium ricotta or cottage cheese. Low-fat or nonfat yogurt. Low-fat, low-sodium cheese. Fats and oils Soft margarine without trans fats. Vegetable oil. Low-fat, reduced-fat, or light mayonnaise and salad dressings (reduced-sodium). Canola, safflower, olive, soybean, and sunflower oils. Avocado. Seasoning and other foods Herbs. Spices. Seasoning mixes without salt. Unsalted popcorn and pretzels. Fat-free sweets. What foods are not recommended? The items listed may not be a complete list. Talk with your dietitian about what dietary choices are best for you. Grains Baked goods made with fat, such as croissants, muffins, or some breads. Dry pasta or rice meal packs. Vegetables Creamed or fried vegetables. Vegetables in a cheese sauce. Regular canned vegetables (not low-sodium or reduced-sodium). Regular canned tomato sauce and paste (not low-sodium or reduced-sodium). Regular tomato and vegetable juice (not low-sodium or reduced-sodium). Pickles. Olives. Fruits Canned fruit in a light or heavy syrup. Fried fruit. Fruit in cream or butter sauce. Meat and other protein foods Fatty cuts of meat. Ribs. Fried meat. Bacon. Sausage. Bologna and other processed lunch meats. Salami. Fatback. Hotdogs. Bratwurst. Salted nuts and seeds. Canned beans with added salt. Canned or smoked fish. Whole eggs or egg yolks. Chicken or turkey with skin. Dairy Whole or 2% milk, cream, and half-and-half. Whole or full-fat cream cheese. Whole-fat or sweetened yogurt. Full-fat cheese. Nondairy creamers. Whipped toppings.  Processed cheese and cheese spreads. Fats and oils Butter. Stick margarine. Lard. Shortening. Ghee. Bacon fat. Tropical oils, such as coconut, palm kernel, or palm oil. Seasoning and other foods Salted popcorn and pretzels. Onion salt, garlic salt, seasoned salt, table salt, and sea salt. Worcestershire sauce. Tartar sauce. Barbecue sauce. Teriyaki sauce. Soy sauce, including reduced-sodium. Steak sauce. Canned and packaged gravies. Fish sauce. Oyster sauce. Cocktail sauce. Horseradish that you find on the shelf. Ketchup. Mustard. Meat flavorings and tenderizers. Bouillon cubes. Hot sauce and Tabasco sauce. Premade or packaged marinades. Premade or packaged taco seasonings. Relishes. Regular salad dressings. Where to find more information:  National Heart, Lung, and Blood Institute: www.nhlbi.nih.gov  American Heart Association: www.heart.org Summary  The DASH eating plan is a healthy eating plan that has been shown to reduce high blood pressure (hypertension). It may also reduce your risk for type 2 diabetes, heart disease, and stroke.  With the DASH eating plan, you should limit salt (sodium) intake to 2,300 mg a day. If you have hypertension, you may need to reduce your sodium intake to 1,500 mg a day.  When on the DASH eating plan, aim to eat more fresh fruits and vegetables, whole grains, lean proteins, low-fat dairy, and heart-healthy fats.  Work with your health care provider or diet and nutrition specialist (dietitian) to adjust your eating plan to your   individual calorie needs. This information is not intended to replace advice given to you by your health care provider. Make sure you discuss any questions you have with your health care provider. Document Revised: 07/20/2017 Document Reviewed: 07/31/2016 Elsevier Patient Education  2020 [employer]  

## 2020-08-06 MED ORDER — VITAMIN D (ERGOCALCIFEROL) 1.25 MG (50000 UNIT) PO CAPS: 50000.0000 [IU] | ORAL_CAPSULE | ORAL | 0 refills | Status: DC

## 2020-08-06 NOTE — Addendum Note (Signed)
Addended byDamita Dunnings D on: 08/06/2020 08:44 AM   Modules accepted: Orders

## 2020-08-23 ENCOUNTER — Ambulatory Visit: Payer: Federal, State, Local not specified - PPO | Admitting: Family Medicine

## 2020-09-10 ENCOUNTER — Encounter (INDEPENDENT_AMBULATORY_CARE_PROVIDER_SITE_OTHER): Payer: Self-pay

## 2020-09-15 ENCOUNTER — Encounter: Payer: Self-pay | Admitting: Gastroenterology

## 2020-09-16 ENCOUNTER — Other Ambulatory Visit: Payer: Self-pay

## 2020-09-16 ENCOUNTER — Encounter: Payer: Self-pay | Admitting: Family Medicine

## 2020-09-16 ENCOUNTER — Ambulatory Visit: Payer: Federal, State, Local not specified - PPO | Admitting: Family Medicine

## 2020-09-16 VITALS — BP 110/80 | HR 80 | Temp 97.6°F | Resp 18 | Ht 63.0 in | Wt 170.8 lb

## 2020-09-16 DIAGNOSIS — I10 Essential (primary) hypertension: Secondary | ICD-10-CM | POA: Diagnosis not present

## 2020-09-16 NOTE — Patient Instructions (Signed)

## 2020-09-16 NOTE — Progress Notes (Signed)
Patient ID: Megan Murphy, female    DOB: 13-Aug-1956  Age: 65 y.o. MRN: [id number]    Subjective:  Subjective  HPI Megan Murphy presents for bp  No other complaints  Review of Systems  Constitutional: Negative for appetite change, diaphoresis, fatigue and unexpected weight change.  Eyes: Negative for pain, redness and visual disturbance.  Respiratory: Negative for cough, chest tightness, shortness of breath and wheezing.   Cardiovascular: Negative for chest pain, palpitations and leg swelling.  Endocrine: Negative for cold intolerance, heat intolerance, polydipsia, polyphagia and polyuria.  Genitourinary: Negative for difficulty urinating, dysuria and frequency.  Neurological: Negative for dizziness, light-headedness, numbness and headaches.    History Past Medical History:  Diagnosis Date  . Basal cell cancer     X 2;Dr Tonia Brooms  . History of migraine headaches   . Hyperlipidemia   . Hypertension     Megan Murphy has a past surgical history that includes dilation and curretage; Colonoscopy (2012); Excision basal cell carcinoma; Wisdom tooth extraction; Cesarean section; and Cholecystectomy (N/A, 01/09/2018).   Her family history includes Cancer in her maternal grandmother and mother; Colon polyps in her sister; Heart attack (age of onset: 46) in her brother; Hypertension in her brother and sister; Stroke (age of onset: 80) in her sister; Stroke (age of onset: 86) in her brother.Megan Murphy reports that Megan Murphy has never smoked. Megan Murphy has never used smokeless tobacco. Megan Murphy reports current alcohol use. Megan Murphy reports that Megan Murphy does not use drugs.  Current Outpatient Medications on File Prior to Visit  Medication Sig Dispense Refill  . Calcium Carbonate-Vitamin D (CALTRATE 600+D PO) Take 1 tablet by mouth daily.    . clobetasol ointment (TEMOVATE) 9.38 % Apply 1 application topically daily as needed (lichen sclerosus). 30 g 1  . COLLAGEN PO Take 1 Dose by mouth daily. In powder form    . losartan (COZAAR)  50 MG tablet Take 1 tablet (50 mg total) by mouth daily. 90 tablet 3  . OVER THE COUNTER MEDICATION Take 1 Dose by mouth daily. Vitacup vitamin infused coffee    . OVER THE COUNTER MEDICATION [place]obab otc vitamin powder Mix 2 teaspoons with liquid once daily    . Protein POWD Take 1 Dose by mouth daily.    . verapamil (CALAN-SR) 180 MG CR tablet 2 po qhs 180 tablet 2  . Vitamin D, Ergocalciferol, (DRISDOL) 1.25 MG (50000 UNIT) CAPS capsule Take 1 capsule (50,000 Units total) by mouth every 7 (seven) days. 12 capsule 0   No current facility-administered medications on file prior to visit.     Objective:  Objective  Physical Exam Vitals and nursing note reviewed.  Constitutional:      Appearance: Megan Murphy is well-developed and well-nourished.  HENT:     Head: Normocephalic and atraumatic.  Eyes:     Extraocular Movements: EOM normal.     Conjunctiva/sclera: Conjunctivae normal.  Neck:     Thyroid: No thyromegaly.     Vascular: No carotid bruit or JVD.  Cardiovascular:     Rate and Rhythm: Normal rate and regular rhythm.     Heart sounds: Normal heart sounds. No murmur heard.   Pulmonary:     Effort: Pulmonary effort is normal. No respiratory distress.     Breath sounds: Normal breath sounds. No wheezing or rales.  Chest:     Chest wall: No tenderness.  Musculoskeletal:        General: No edema.     Cervical back: Normal range of motion and neck  supple.  Neurological:     Mental Status: Megan Murphy is alert and oriented to person, place, and time.  Psychiatric:        Mood and Affect: Mood and affect and mood normal.        Behavior: Behavior normal.        Thought Content: Thought content normal.        Judgment: Judgment normal.    BP 110/80 (BP Location: Right Arm, Patient Position: Sitting, Cuff Size: Normal)   Pulse 80   Temp 97.6 F (36.4 C) (Oral)   Resp 18   Ht 5\' 3"  (1.6 m)   Wt 170 lb 12.8 oz (77.5 kg)   SpO2 96%   BMI 30.26 kg/m  Wt Readings from Last 3 Encounters:   09/16/20 170 lb 12.8 oz (77.5 kg)  08/02/20 175 lb (79.4 kg)  04/19/20 177 lb 6.4 oz (80.5 kg)     Lab Results  Component Value Date   WBC 5.4 10/24/2019   HGB 14.1 10/24/2019   HCT 41.7 10/24/2019   PLT 278.0 10/24/2019   GLUCOSE 88 08/02/2020   CHOL 242 (H) 08/02/2020   TRIG 79.0 08/02/2020   HDL 89.20 08/02/2020   LDLDIRECT 134.3 03/13/2012   LDLCALC 137 (H) 08/02/2020   ALT 23 08/02/2020   AST 16 08/02/2020   NA 138 08/02/2020   K 4.1 08/02/2020   CL 103 08/02/2020   CREATININE 0.67 08/02/2020   BUN 15 08/02/2020   CO2 29 08/02/2020   TSH 1.63 10/24/2019   HGBA1C 5.5 03/07/2011    [place] LT UPPER EXTREM LTD SOFT TISSUE NON VASCULAR  Result Date: 10/25/2019 CLINICAL DATA:  Palpable soft tissue mass involving the anterior aspect of the left shoulder. EXAM: ULTRASOUND left UPPER EXTREMITY LIMITED TECHNIQUE: Ultrasound examination of the upper extremity soft tissues was performed in the area of clinical concern. COMPARISON:  None. FINDINGS: The patient's palpable abnormality corresponds to a 4 x 2 cm well-circumscribed and encapsulated appearing lesion in the deep subcutaneous fat. The echogenicity is very similar to the surrounding subcutaneous fat. No internal blood flow is demonstrated. IMPRESSION: 4 x 2 cm lesion in the subcutaneous fat is most likely a benign lipoma. Recommend clinical surveillance and reimaging with MRI if lesion changes on physical examination (enlarges or becomes fixed or firm). Electronically Signed   By: Marijo Sanes M.D.   On: 10/25/2019 08:46     Assessment & Plan:  Plan  I am having Megan Murphy. Megan Murphy maintain her OVER THE COUNTER MEDICATION, COLLAGEN PO, Protein, Calcium Carbonate-Vitamin D (CALTRATE 600+D PO), OVER THE COUNTER MEDICATION, clobetasol ointment, verapamil, losartan, and Vitamin D (Ergocalciferol).  No orders of the defined types were placed in this encounter.   Problem List Items Addressed This Visit   None   Visit Diagnoses     Primary hypertension    -  Primary    Well controlled, no changes to meds. Encouraged heart healthy diet such as the DASH diet and exercise as tolerated.   Follow-up: Return if symptoms worsen or fail to improve, for as scheduled .  Ann Held, DO

## 2020-10-06 ENCOUNTER — Ambulatory Visit (AMBULATORY_SURGERY_CENTER): Payer: Self-pay | Admitting: *Deleted

## 2020-10-06 ENCOUNTER — Other Ambulatory Visit: Payer: Self-pay

## 2020-10-06 VITALS — Ht 63.0 in | Wt 172.0 lb

## 2020-10-06 DIAGNOSIS — Z1211 Encounter for screening for malignant neoplasm of colon: Secondary | ICD-10-CM

## 2020-10-06 NOTE — Progress Notes (Signed)
Patient is here in-person for PV. Patient denies any allergies to eggs or soy. Patient denies any problems with anesthesia/sedation. Patient denies any oxygen use at home. Patient denies taking any diet/weight loss medications or blood thinners. Patient is not being treated for MRSA or C-diff. Patient is aware of our care-partner policy and MBBUY-37 safety protocol. EMMI education assigned to the patient for the procedure, sent to [facility].   COVID-19 vaccines completed x3, per patient.

## 2020-10-18 ENCOUNTER — Telehealth: Payer: Self-pay | Admitting: Gastroenterology

## 2020-10-18 NOTE — Telephone Encounter (Signed)
Called patient to remind her of her appointment on 10-20-20. Patient stated she was positive for covid on Sunday.  She is having cold like symptoms and rescheduled For  10-29-20. Please is requesting new prep instructions be mailed out.

## 2020-10-18 NOTE — Telephone Encounter (Signed)
Mailed new prep instructions to pt and sent via mychart.

## 2020-10-20 ENCOUNTER — Encounter: Payer: Federal, State, Local not specified - PPO | Admitting: Gastroenterology

## 2020-10-26 ENCOUNTER — Encounter: Payer: Federal, State, Local not specified - PPO | Admitting: Family Medicine

## 2020-10-29 ENCOUNTER — Encounter: Payer: Self-pay | Admitting: Gastroenterology

## 2020-10-29 ENCOUNTER — Other Ambulatory Visit: Payer: Self-pay

## 2020-10-29 ENCOUNTER — Ambulatory Visit (AMBULATORY_SURGERY_CENTER): Payer: Federal, State, Local not specified - PPO | Admitting: Gastroenterology

## 2020-10-29 VITALS — BP 129/77 | HR 62 | Temp 97.0°F | Resp 17 | Ht 63.0 in | Wt 172.0 lb

## 2020-10-29 DIAGNOSIS — K5520 Angiodysplasia of colon without hemorrhage: Secondary | ICD-10-CM

## 2020-10-29 DIAGNOSIS — K640 First degree hemorrhoids: Secondary | ICD-10-CM

## 2020-10-29 DIAGNOSIS — D125 Benign neoplasm of sigmoid colon: Secondary | ICD-10-CM | POA: Diagnosis not present

## 2020-10-29 DIAGNOSIS — K5730 Diverticulosis of large intestine without perforation or abscess without bleeding: Secondary | ICD-10-CM

## 2020-10-29 DIAGNOSIS — Z1211 Encounter for screening for malignant neoplasm of colon: Secondary | ICD-10-CM

## 2020-10-29 MED ORDER — SODIUM CHLORIDE 0.9 % IV SOLN: 500.0000 mL | Freq: Once | INTRAVENOUS | Status: DC

## 2020-10-29 NOTE — Progress Notes (Signed)
Called to room to assist during endoscopic procedure.  Patient ID and intended procedure confirmed with present staff. Received instructions for my participation in the procedure from the performing physician.  

## 2020-10-29 NOTE — Op Note (Signed)
[facility] Patient Name: Megan Murphy Procedure Date: 10/29/2020 10:22 AM MRN: [id number] Endoscopist: Gerrit Heck , MD Age: 65 Referring MD:  Date of Birth: [birth year] Gender: Female Account #: [id number] Procedure:                Colonoscopy Indications:              Screening for colorectal malignant neoplasm (last                            colonoscopy was 10 years ago)                           Last colonoscopy was 05/2010 and only notable for                            internal hemorrhoids. Otherwise, no active GI                            symptoms. Medicines:                Monitored Anesthesia Care Procedure:                Pre-Anesthesia Assessment:                           - Prior to the procedure, a History and Physical                            was performed, and patient medications and                            allergies were reviewed. The patient's tolerance of                            previous anesthesia was also reviewed. The risks                            and benefits of the procedure and the sedation                            options and risks were discussed with the patient.                            All questions were answered, and informed consent                            was obtained. Prior Anticoagulants: The patient has                            taken no previous anticoagulant or antiplatelet                            agents. ASA Grade Assessment: II - A patient with  mild systemic disease. After reviewing the risks                            and benefits, the patient was deemed in                            satisfactory condition to undergo the procedure.                           After obtaining informed consent, the colonoscope                            was passed under direct vision. Throughout the                            procedure, the patient's blood pressure, pulse, and                             oxygen saturations were monitored continuously. The                            Olympus CF-HQ190 [id number]) Colonoscope was                            introduced through the anus and advanced to the the                            terminal ileum. The colonoscopy was performed                            without difficulty. The patient tolerated the                            procedure well. The quality of the bowel                            preparation was good. The terminal ileum, ileocecal                            valve, appendiceal orifice, and rectum were                            photographed. Scope In: 10:26:31 AM Scope Out: 10:43:41 AM Scope Withdrawal Time: 0 hours 14 minutes 31 seconds  Total Procedure Duration: 0 hours 17 minutes 10 seconds  Findings:                 The perianal and digital rectal examinations were                            normal.                           Two polyps were found in the sigmoid colon. The  polyps were 3 to 8 mm in size. The smaller polyp                            was sessile and the larger was semi-pedunculated.                            These polyps were removed with a cold snare.                            Resection and retrieval were complete. Estimated                            blood loss was minimal.                           A few small-mouthed diverticula were found in the                            sigmoid colon.                           A single small angioectasia without bleeding was                            found in the transverse colon.                           Non-bleeding internal hemorrhoids were found during                            retroflexion. The hemorrhoids were small.                           The exam was otherwise normal throughout the                            remainder of the colon.                           The terminal ileum appeared normal. Complications:            No  immediate complications. Estimated Blood Loss:     Estimated blood loss was minimal. Impression:               - Two 3 to 8 mm polyps in the sigmoid colon,                            removed with a cold snare. Resected and retrieved.                           - Diverticulosis in the sigmoid colon.                           - A single non-bleeding colonic angioectasia.                           -  Non-bleeding internal hemorrhoids.                           - The examined portion of the ileum was normal. Recommendation:           - Patient has a contact number available for                            emergencies. The signs and symptoms of potential                            delayed complications were discussed with the                            patient. Return to normal activities tomorrow.                            Written discharge instructions were provided to the                            patient.                           - Resume previous diet.                           - Continue present medications.                           - Await pathology results.                           - Repeat colonoscopy for surveillance based on                            pathology results.                           - Return to GI office PRN.                           - Use fiber, for example Citrucel, Fibercon, Konsyl                            or Metamucil.                           - Internal hemorrhoids were noted on this study and                            may be amenable to hemorrhoid band ligation. If you                            are interested in further treatment of these                            hemorrhoids with  band ligation, please contact my                            clinic to set up an appointment for evaluation and                            treatment. Gerrit Heck, MD 10/29/2020 10:48:58 AM

## 2020-10-29 NOTE — Patient Instructions (Addendum)
Handouts were given to your care partner on polyps, diverticulosis, hemorrhoids, and Story banding. If you are interested in discussing hemorrhoidal banding, call [phone] to make an appointment. Add over the counter fiber, for example Citrucel, Fibercon, Konsyl or Metamucil. Resume your present medications today. Await biopsy results.  May take 1-3 weeks to receive pathology results. Please call if any questions or concerns.     YOU HAD AN ENDOSCOPIC PROCEDURE TODAY AT [facility]:   Refer to the procedure report that was given to you for any specific questions about what was found during the examination.  If the procedure report does not answer your questions, please call your gastroenterologist to clarify.  If you requested that your care partner not be given the details of your procedure findings, then the procedure report has been included in a sealed envelope for you to review at your convenience later.  YOU SHOULD EXPECT: Some feelings of bloating in the abdomen. Passage of more gas than usual.  Walking can help get rid of the air that was put into your GI tract during the procedure and reduce the bloating. If you had a lower endoscopy (such as a colonoscopy or flexible sigmoidoscopy) you may notice spotting of blood in your stool or on the toilet paper. If you underwent a bowel prep for your procedure, you may not have a normal bowel movement for a few days.  Please Note:  You might notice some irritation and congestion in your nose or some drainage.  This is from the oxygen used during your procedure.  There is no need for concern and it should clear up in a day or so.  SYMPTOMS TO REPORT IMMEDIATELY:   Following lower endoscopy (colonoscopy or flexible sigmoidoscopy):  Excessive amounts of blood in the stool  Significant tenderness or worsening of abdominal pains  Swelling of the abdomen that is new, acute  Fever of 100F or higher For  urgent or emergent issues, a gastroenterologist can be reached at any hour by calling [phone]. Do not use MyChart messaging for urgent concerns.    DIET:  We do recommend a small meal at first, but then you may proceed to your regular diet.  Drink plenty of fluids but you should avoid alcoholic beverages for 24 hours.  ACTIVITY:  You should plan to take it easy for the rest of today and you should NOT DRIVE or use heavy machinery until tomorrow (because of the sedation medicines used during the test).    FOLLOW UP: Our staff will call the number listed on your records 48-72 hours following your procedure to check on you and address any questions or concerns that you may have regarding the information given to you following your procedure. If we do not reach you, we will leave a message.  We will attempt to reach you two times.  During this call, we will ask if you have developed any symptoms of COVID 19. If you develop any symptoms (ie: fever, flu-like symptoms, shortness of breath, cough etc.) before then, please call [phone].  If you test positive for Covid 19 in the 2 weeks post procedure, please call and report this information to [place].    If any biopsies were taken you will be contacted by phone or by letter within the next 1-3 weeks.  Please call us at [phone] if you have not heard about the biopsies in 3 weeks.    SIGNATURES/CONFIDENTIALITY: You and/or your care partner  have signed paperwork which will be entered into your electronic medical record.  These signatures attest to the fact that that the information above on your After Visit Summary has been reviewed and is understood.  Full responsibility of the confidentiality of this discharge information lies with you and/or your care-partner.

## 2020-10-29 NOTE — Progress Notes (Signed)
Report to PACU, RN, vss, BBS= Clear.  

## 2020-10-29 NOTE — Progress Notes (Signed)
No problems noted in the recovery room. maw 

## 2020-10-29 NOTE — Progress Notes (Signed)
Vitals-CW  Pt's states no medical or surgical changes since previsit or office visit. 

## 2020-11-03 ENCOUNTER — Telehealth: Payer: Self-pay

## 2020-11-03 NOTE — Telephone Encounter (Signed)
LVM

## 2020-11-04 ENCOUNTER — Encounter: Payer: Self-pay | Admitting: Gastroenterology

## 2020-11-09 ENCOUNTER — Ambulatory Visit (INDEPENDENT_AMBULATORY_CARE_PROVIDER_SITE_OTHER): Payer: Federal, State, Local not specified - PPO | Admitting: Family Medicine

## 2020-11-09 ENCOUNTER — Other Ambulatory Visit: Payer: Self-pay

## 2020-11-09 ENCOUNTER — Encounter: Payer: Self-pay | Admitting: Family Medicine

## 2020-11-09 VITALS — BP 94/60 | HR 68 | Temp 98.2°F | Resp 18 | Ht 63.0 in | Wt 166.2 lb

## 2020-11-09 DIAGNOSIS — I10 Essential (primary) hypertension: Secondary | ICD-10-CM | POA: Diagnosis not present

## 2020-11-09 DIAGNOSIS — Z0000 Encounter for general adult medical examination without abnormal findings: Secondary | ICD-10-CM | POA: Diagnosis not present

## 2020-11-09 DIAGNOSIS — E782 Mixed hyperlipidemia: Secondary | ICD-10-CM | POA: Diagnosis not present

## 2020-11-09 MED ORDER — LOSARTAN POTASSIUM 25 MG PO TABS: 25.0000 mg | ORAL_TABLET | Freq: Every day | ORAL | 0 refills | Status: DC

## 2020-11-09 NOTE — Assessment & Plan Note (Signed)
Running low Dec losartan 25 mg and f/u nurse visit in 2 weeks for bp check

## 2020-11-09 NOTE — Patient Instructions (Signed)
Preventive Care 65-65 Years Old, Female Preventive care refers to lifestyle choices and visits with your health care provider that can promote health and wellness. This includes:  A yearly physical exam. This is also called an annual wellness visit.  Regular dental and eye exams.  Immunizations.  Screening for certain conditions.  Healthy lifestyle choices, such as: ? Eating a healthy diet. ? Getting regular exercise. ? Not using drugs or products that contain nicotine and tobacco. ? Limiting alcohol use. What can I expect for my preventive care visit? Physical exam Your health care provider will check your:  Height and weight. These may be used to calculate your BMI (body mass index). BMI is a measurement that tells if you are at a healthy weight.  Heart rate and blood pressure.  Body temperature.  Skin for abnormal spots. Counseling Your health care provider may ask you questions about your:  Past medical problems.  Family's medical history.  Alcohol, tobacco, and drug use.  Emotional well-being.  Home life and relationship well-being.  Sexual activity.  Diet, exercise, and sleep habits.  Work and work Statistician.  Access to firearms.  Method of birth control.  Menstrual cycle.  Pregnancy history. What immunizations do I need? Vaccines are usually given at various ages, according to a schedule. Your health care provider will recommend vaccines for you based on your age, medical history, and lifestyle or other factors, such as travel or where you work.   What tests do I need? Blood tests  Lipid and cholesterol levels. These may be checked every 5 years, or more often if you are over 65 years old.  Hepatitis C test.  Hepatitis B test. Screening  Lung cancer screening. You may have this screening every year starting at age 65 if you have a 30-pack-year history of smoking and currently smoke or have quit within the past 15 years.  Colorectal cancer  screening. ? All adults should have this screening starting at age 65 and continuing until age 17. ? Your health care provider may recommend screening at age 65 if you are at increased risk. ? You will have tests every 1-10 years, depending on your results and the type of screening test.  Diabetes screening. ? This is done by checking your blood sugar (glucose) after you have not eaten for a while (fasting). ? You may have this done every 1-3 years.  Mammogram. ? This may be done every 1-2 years. ? Talk with your health care provider about when you should start having regular mammograms. This may depend on whether you have a family history of breast cancer.  BRCA-related cancer screening. This may be done if you have a family history of breast, ovarian, tubal, or peritoneal cancers.  Pelvic exam and Pap test. ? This may be done every 3 years starting at age 65. ? Starting at age 65, this may be done every 5 years if you have a Pap test in combination with an HPV test. Other tests  STD (sexually transmitted disease) testing, if you are at risk.  Bone density scan. This is done to screen for osteoporosis. You may have this scan if you are at high risk for osteoporosis. Talk with your health care provider about your test results, treatment options, and if necessary, the need for more tests. Follow these instructions at home: Eating and drinking  Eat a diet that includes fresh fruits and vegetables, whole grains, lean protein, and low-fat dairy products.  Take vitamin and mineral supplements  as recommended by your health care provider.  Do not drink alcohol if: ? Your health care provider tells you not to drink. ? You are pregnant, may be pregnant, or are planning to become pregnant.  If you drink alcohol: ? Limit how much you have to 0-1 drink a day. ? Be aware of how much alcohol is in your drink. In the [place] one drink equals one 12 oz bottle of beer (355 mL), one 5 oz glass of  wine (148 mL), or one 1 oz glass of hard liquor (44 mL).   Lifestyle  Take daily care of your teeth and gums. Brush your teeth every morning and night with fluoride toothpaste. Floss one time each day.  Stay active. Exercise for at least 30 minutes 5 or more days each week.  Do not use any products that contain nicotine or tobacco, such as cigarettes, e-cigarettes, and chewing tobacco. If you need help quitting, ask your health care provider.  Do not use drugs.  If you are sexually active, practice safe sex. Use a condom or other form of protection to prevent STIs (sexually transmitted infections).  If you do not wish to become pregnant, use a form of birth control. If you plan to become pregnant, see your health care provider for a prepregnancy visit.  If told by your health care provider, take low-dose aspirin daily starting at age 65.  Find healthy ways to cope with stress, such as: ? Meditation, yoga, or listening to music. ? Journaling. ? Talking to a trusted person. ? Spending time with friends and family. Safety  Always wear your seat belt while driving or riding in a vehicle.  Do not drive: ? If you have been drinking alcohol. Do not ride with someone who has been drinking. ? When you are tired or distracted. ? While texting.  Wear a helmet and other protective equipment during sports activities.  If you have firearms in your house, make sure you follow all gun safety procedures. What's next?  Visit your health care provider once a year for an annual wellness visit.  Ask your health care provider how often you should have your eyes and teeth checked.  Stay up to date on all vaccines. This information is not intended to replace advice given to you by your health care provider. Make sure you discuss any questions you have with your health care provider. Document Revised: 05/11/2020 Document Reviewed: 04/18/2018 Elsevier Patient Education  2021 [employer]  

## 2020-11-09 NOTE — Assessment & Plan Note (Signed)
Encouraged heart healthy diet, increase exercise, avoid trans fats, consider a krill oil cap daily 

## 2020-11-09 NOTE — Assessment & Plan Note (Signed)
ghm utd Check labs See AVS 

## 2020-11-09 NOTE — Progress Notes (Signed)
Patient ID: Megan Murphy, female    DOB: 02/22/1956  Age: 65 y.o. MRN: [id number]    Subjective:  Subjective  HPI Megan Murphy presents for comprehensive physical exam and follow up on management of chronic concerns today. She reports that she is feeling well.   She is thinking about lowering her BP medication dosage, since her recent BP readings have been lower than her baseline.  She states feeling like she is about to passout occasionally. However she denies any chest pain, SOB, fever, abdominal pain, cough, chills, sore throat, dysuria, urinary incontinence, back pain, HA, or N/VD at this time.   BP Readings from Last 3 Encounters:  11/09/20 94/60  10/29/20 129/77  09/16/20 110/80   She has lost over 8 lbs since the last visit.She endorses taking Vitamin D 1000 units, but she recently ran out of it. Last Weight  Most recent update: 11/09/2020  1:16 PM   Weight  75.4 kg (166 lb 3.2 oz)           She denies any changes in FMHx and denies any past surgeries. She states that she retired from work at the end of September.    Review of Systems  Constitutional: Negative for chills, fatigue and fever.  HENT: Negative for congestion, rhinorrhea, sinus pressure, sinus pain and sore throat.   Eyes: Negative for pain.  Respiratory: Negative for shortness of breath.   Cardiovascular: Negative for chest pain, palpitations and leg swelling.  Gastrointestinal: Negative for abdominal pain, blood in stool, diarrhea, nausea and vomiting.  Genitourinary: Negative for flank pain, frequency, vaginal bleeding, vaginal discharge and vaginal pain.  Musculoskeletal: Negative for myalgias.  Neurological: Negative for headaches.    History Past Medical History:  Diagnosis Date  . Basal cell cancer     X 2;Dr Tonia Brooms  . History of migraine headaches   . Hyperlipidemia   . Hypertension     She has a past surgical history that includes dilation and curretage; Colonoscopy (2012); Excision  basal cell carcinoma; Wisdom tooth extraction; Cesarean section; and Cholecystectomy (N/A, 01/09/2018).   Her family history includes Cancer in her maternal grandmother and mother; Colon polyps in her sister; Heart attack (age of onset: 87) in her brother; Hypertension in her brother and sister; Stroke (age of onset: 51) in her sister; Stroke (age of onset: 51) in her brother.She reports that she has never smoked. She has never used smokeless tobacco. She reports current alcohol use. She reports that she does not use drugs.  Current Outpatient Medications on File Prior to Visit  Medication Sig Dispense Refill  . Calcium Carbonate-Vitamin D (CALTRATE 600+D PO) Take 1 tablet by mouth daily.    . Cholecalciferol (VITAMIN D3 PO) Take by mouth.    . clobetasol ointment (TEMOVATE) 2.77 % Apply 1 application topically daily as needed (lichen sclerosus). 30 g 1  . COLLAGEN PO Take 1 Dose by mouth daily. In powder form    . OVER THE COUNTER MEDICATION Take 1 Dose by mouth daily. Vitacup vitamin infused coffee    . OVER THE COUNTER MEDICATION Baobab otc vitamin powder Mix 2 teaspoons with liquid once daily    . Protein POWD Take 1 Dose by mouth daily.    . verapamil (CALAN-SR) 180 MG CR tablet 2 po qhs 180 tablet 2   No current facility-administered medications on file prior to visit.     Objective:  Objective  Physical Exam Constitutional:      General: She is not  in acute distress.    Appearance: Normal appearance. She is well-developed. She is not ill-appearing.  HENT:     Head: Normocephalic and atraumatic.     Right Ear: Tympanic membrane, ear canal and external ear normal.     Left Ear: Tympanic membrane, ear canal and external ear normal.     Nose: Nose normal.  Eyes:     Extraocular Movements: Extraocular movements intact.     Pupils: Pupils are equal, round, and reactive to light.     Comments: No Nystagmus  Neck:     Comments: No JVD Cardiovascular:     Rate and Rhythm: Normal rate  and regular rhythm.     Pulses: Normal pulses.     Heart sounds: Normal heart sounds. No murmur heard.   Pulmonary:     Effort: Pulmonary effort is normal. No respiratory distress.     Breath sounds: Normal breath sounds. No wheezing, rhonchi or rales.  Abdominal:     General: Bowel sounds are normal.     Palpations: Abdomen is soft. There is no mass.     Tenderness: There is no abdominal tenderness. There is no guarding.  Musculoskeletal:        General: Normal range of motion.     Cervical back: Normal range of motion and neck supple.     Right lower leg: No edema.     Left lower leg: No edema.     Comments: 5/5 strength in UE or LE  Skin:    General: Skin is warm and dry.  Neurological:     Mental Status: She is alert and oriented to person, place, and time.     Sensory: Sensation is intact. No sensory deficit.     Motor: Motor function is intact.     Deep Tendon Reflexes: Reflexes are normal and symmetric.  Psychiatric:        Behavior: Behavior normal.    BP 94/60 (BP Location: Right Arm, Patient Position: Sitting, Cuff Size: Normal)   Pulse 68   Temp 98.2 F (36.8 C) (Oral)   Resp 18   Ht 5\' 3"  (1.6 m)   Wt 166 lb 3.2 oz (75.4 kg)   SpO2 97%   BMI 29.44 kg/m  Wt Readings from Last 3 Encounters:  11/09/20 166 lb 3.2 oz (75.4 kg)  10/29/20 172 lb (78 kg)  10/06/20 172 lb (78 kg)     Lab Results  Component Value Date   WBC 5.4 10/24/2019   HGB 14.1 10/24/2019   HCT 41.7 10/24/2019   PLT 278.0 10/24/2019   GLUCOSE 88 08/02/2020   CHOL 242 (H) 08/02/2020   TRIG 79.0 08/02/2020   HDL 89.20 08/02/2020   LDLDIRECT 134.3 03/13/2012   LDLCALC 137 (H) 08/02/2020   ALT 23 08/02/2020   AST 16 08/02/2020   NA 138 08/02/2020   K 4.1 08/02/2020   CL 103 08/02/2020   CREATININE 0.67 08/02/2020   BUN 15 08/02/2020   CO2 29 08/02/2020   TSH 1.63 10/24/2019   HGBA1C 5.5 03/07/2011    [place] LT UPPER EXTREM LTD SOFT TISSUE NON VASCULAR  Result Date:  10/25/2019 CLINICAL DATA:  Palpable soft tissue mass involving the anterior aspect of the left shoulder. EXAM: ULTRASOUND left UPPER EXTREMITY LIMITED TECHNIQUE: Ultrasound examination of the upper extremity soft tissues was performed in the area of clinical concern. COMPARISON:  None. FINDINGS: The patient's palpable abnormality corresponds to a 4 x 2 cm well-circumscribed and encapsulated appearing lesion in  the deep subcutaneous fat. The echogenicity is very similar to the surrounding subcutaneous fat. No internal blood flow is demonstrated. IMPRESSION: 4 x 2 cm lesion in the subcutaneous fat is most likely a benign lipoma. Recommend clinical surveillance and reimaging with MRI if lesion changes on physical examination (enlarges or becomes fixed or firm). Electronically Signed   By: Marijo Sanes M.D.   On: 10/25/2019 08:46     Assessment & Plan:  Plan    Meds ordered this encounter  Medications  . losartan (COZAAR) 25 MG tablet    Sig: Take 1 tablet (25 mg total) by mouth daily.    Dispense:  30 tablet    Refill:  0    Problem List Items Addressed This Visit      Unprioritized   Essential hypertension    Running low Dec losartan 25 mg and f/u nurse visit in 2 weeks for bp check       Relevant Medications   losartan (COZAAR) 25 MG tablet   HYPERLIPIDEMIA    Encouraged heart healthy diet, increase exercise, avoid trans fats, consider a krill oil cap daily      Relevant Medications   losartan (COZAAR) 25 MG tablet   Preventative health care - Primary    ghm utd Check labs  See AVS      Relevant Orders   Lipid panel   CBC with Differential/Platelet   Comprehensive metabolic panel   TSH   Vitamin D (25 hydroxy)    Other Visit Diagnoses    Primary hypertension       Relevant Medications   losartan (COZAAR) 25 MG tablet   Other Relevant Orders   Lipid panel   CBC with Differential/Platelet   Comprehensive metabolic panel   TSH   Vitamin D (25 hydroxy)       Colonoscopy: Last completed on 10/29/2020 , diverticulosis and internal hemorrhoids where noted, otherwise results were normal, repeat every 7 years.  Mammo: Last completed on 03/01/2020, results were normal, She will repeat the next one at [place]  Pap Smear: Last completed on 01/25/2010, results were normal  Follow-up: Return in about 2 weeks (around 11/23/2020), or if symptoms worsen or fail to improve, for bp check -- nurse visit.  I,Travious Vanover R Lowne Chase,acting as a scribe for [employer], DO.,have documented all relevant documentation on the behalf of Ann Held, DO,as directed by  Ann Held, DO while in the presence of [place], DO, have reviewed all documentation for this visit. The documentation on 11/09/20 for the exam, diagnosis, procedures, and orders are all accurate and complete.

## 2020-11-10 LAB — VITAMIN D 25 HYDROXY (VIT D DEFICIENCY, FRACTURES): VITD: 40.24 ng/mL (ref 30.00–100.00)

## 2020-11-10 LAB — LIPID PANEL
Cholesterol: 219 mg/dL — ABNORMAL HIGH (ref 0–200)
HDL: 75.2 mg/dL (ref >39.00)
LDL Cholesterol: 130 mg/dL — ABNORMAL HIGH (ref 0–99)
NonHDL: 143.53
Total CHOL/HDL Ratio: 3
Triglycerides: 66 mg/dL (ref 0.0–149.0)
VLDL: 13.2 mg/dL (ref 0.0–40.0)

## 2020-11-10 LAB — CBC WITH DIFFERENTIAL/PLATELET
Basophils Absolute: 0 10*3/uL (ref 0.0–0.1)
Basophils Relative: 0.4 % (ref 0.0–3.0)
Eosinophils Absolute: 0.2 10*3/uL (ref 0.0–0.7)
Eosinophils Relative: 4.2 % (ref 0.0–5.0)
HCT: 38.9 % (ref 36.0–46.0)
Hemoglobin: 13.1 g/dL (ref 12.0–15.0)
Lymphocytes Relative: 41.1 % (ref 12.0–46.0)
Lymphs Abs: 2.2 10*3/uL (ref 0.7–4.0)
MCHC: 33.8 g/dL (ref 30.0–36.0)
MCV: 93.8 fl (ref 78.0–100.0)
Monocytes Absolute: 0.5 10*3/uL (ref 0.1–1.0)
Monocytes Relative: 8.7 % (ref 3.0–12.0)
Neutro Abs: 2.4 10*3/uL (ref 1.4–7.7)
Neutrophils Relative %: 45.6 % (ref 43.0–77.0)
Platelets: 276 10*3/uL (ref 150.0–400.0)
RBC: 4.15 Mil/uL (ref 3.87–5.11)
RDW: 12.4 % (ref 11.5–15.5)
WBC: 5.3 10*3/uL (ref 4.0–10.5)

## 2020-11-10 LAB — COMPREHENSIVE METABOLIC PANEL
ALT: 11 U/L (ref 0–35)
AST: 13 U/L (ref 0–37)
Albumin: 4 g/dL (ref 3.5–5.2)
Alkaline Phosphatase: 88 U/L (ref 39–117)
BUN: 14 mg/dL (ref 6–23)
CO2: 26 mEq/L (ref 19–32)
Calcium: 9 mg/dL (ref 8.4–10.5)
Chloride: 102 mEq/L (ref 96–112)
Creatinine, Ser: 0.75 mg/dL (ref 0.40–1.20)
GFR: 83.94 mL/min (ref >60.00)
Glucose, Bld: 83 mg/dL (ref 70–99)
Potassium: 4 mEq/L (ref 3.5–5.1)
Sodium: 139 mEq/L (ref 135–145)
Total Bilirubin: 0.5 mg/dL (ref 0.2–1.2)
Total Protein: 6.5 g/dL (ref 6.0–8.3)

## 2020-11-10 LAB — TSH: TSH: 1.93 u[IU]/mL (ref 0.35–4.50)

## 2020-11-23 ENCOUNTER — Ambulatory Visit (INDEPENDENT_AMBULATORY_CARE_PROVIDER_SITE_OTHER): Payer: Federal, State, Local not specified - PPO

## 2020-11-23 ENCOUNTER — Other Ambulatory Visit: Payer: Self-pay

## 2020-11-23 VITALS — BP 112/70 | HR 67

## 2020-11-23 DIAGNOSIS — I10 Essential (primary) hypertension: Secondary | ICD-10-CM

## 2020-11-23 NOTE — Progress Notes (Signed)
Pt here for Blood pressure check per Dr. Etter Sjogren  Pt currently takes:losartan 25 mg, verapamil 180 mg bid   Pt reports compliance with medication.  BP today @ = 112/70 HR =67  BP Readings from Last 3 Encounters:  11/09/20 94/60  10/29/20 129/77  09/16/20 110/80   Patient takes medication at night.

## 2021-01-13 DIAGNOSIS — Z01411 Encounter for gynecological examination (general) (routine) with abnormal findings: Secondary | ICD-10-CM | POA: Diagnosis not present

## 2021-01-13 DIAGNOSIS — Z1231 Encounter for screening mammogram for malignant neoplasm of breast: Secondary | ICD-10-CM | POA: Diagnosis not present

## 2021-01-13 DIAGNOSIS — Z6827 Body mass index (BMI) 27.0-27.9, adult: Secondary | ICD-10-CM | POA: Diagnosis not present

## 2021-01-13 DIAGNOSIS — Z809 Family history of malignant neoplasm, unspecified: Secondary | ICD-10-CM | POA: Diagnosis not present

## 2021-01-13 DIAGNOSIS — Z124 Encounter for screening for malignant neoplasm of cervix: Secondary | ICD-10-CM | POA: Diagnosis not present

## 2021-01-13 DIAGNOSIS — Z01419 Encounter for gynecological examination (general) (routine) without abnormal findings: Secondary | ICD-10-CM | POA: Diagnosis not present

## 2021-01-13 DIAGNOSIS — Z113 Encounter for screening for infections with a predominantly sexual mode of transmission: Secondary | ICD-10-CM | POA: Diagnosis not present

## 2021-05-24 ENCOUNTER — Other Ambulatory Visit: Payer: Self-pay | Admitting: Family Medicine

## 2021-05-24 DIAGNOSIS — I10 Essential (primary) hypertension: Secondary | ICD-10-CM

## 2021-05-25 ENCOUNTER — Other Ambulatory Visit: Payer: Self-pay | Admitting: Family Medicine

## 2021-05-25 DIAGNOSIS — I10 Essential (primary) hypertension: Secondary | ICD-10-CM

## 2021-06-02 ENCOUNTER — Encounter: Payer: Self-pay | Admitting: Family Medicine

## 2021-06-02 ENCOUNTER — Other Ambulatory Visit: Payer: Self-pay

## 2021-06-02 ENCOUNTER — Ambulatory Visit (INDEPENDENT_AMBULATORY_CARE_PROVIDER_SITE_OTHER): Payer: Medicare Other | Admitting: Family Medicine

## 2021-06-02 VITALS — BP 120/80 | HR 76 | Temp 98.0°F | Resp 18 | Ht 63.0 in | Wt 142.2 lb

## 2021-06-02 DIAGNOSIS — R1012 Left upper quadrant pain: Secondary | ICD-10-CM | POA: Insufficient documentation

## 2021-06-02 LAB — CBC WITH DIFFERENTIAL/PLATELET
Basophils Absolute: 0 10*3/uL (ref 0.0–0.1)
Basophils Relative: 0.4 % (ref 0.0–3.0)
Eosinophils Absolute: 0.4 10*3/uL (ref 0.0–0.7)
Eosinophils Relative: 6.3 % — ABNORMAL HIGH (ref 0.0–5.0)
HCT: 40.2 % (ref 36.0–46.0)
Hemoglobin: 13.3 g/dL (ref 12.0–15.0)
Lymphocytes Relative: 38.5 % (ref 12.0–46.0)
Lymphs Abs: 2.2 10*3/uL (ref 0.7–4.0)
MCHC: 33.1 g/dL (ref 30.0–36.0)
MCV: 96.3 fl (ref 78.0–100.0)
Monocytes Absolute: 0.5 10*3/uL (ref 0.1–1.0)
Monocytes Relative: 8.7 % (ref 3.0–12.0)
Neutro Abs: 2.7 10*3/uL (ref 1.4–7.7)
Neutrophils Relative %: 46.1 % (ref 43.0–77.0)
Platelets: 244 10*3/uL (ref 150.0–400.0)
RBC: 4.17 Mil/uL (ref 3.87–5.11)
RDW: 12 % (ref 11.5–15.5)
WBC: 5.8 10*3/uL (ref 4.0–10.5)

## 2021-06-02 LAB — POC URINALSYSI DIPSTICK (AUTOMATED)
Bilirubin, UA: NEGATIVE
Blood, UA: NEGATIVE
Glucose, UA: NEGATIVE
Leukocytes, UA: NEGATIVE
Nitrite, UA: NEGATIVE
Protein, UA: NEGATIVE
Spec Grav, UA: 1.01 (ref 1.010–1.025)
Urobilinogen, UA: 0.2 E.U./dL
pH, UA: 6 (ref 5.0–8.0)

## 2021-06-02 LAB — COMPREHENSIVE METABOLIC PANEL
ALT: 13 U/L (ref 0–35)
AST: 15 U/L (ref 0–37)
Albumin: 4 g/dL (ref 3.5–5.2)
Alkaline Phosphatase: 93 U/L (ref 39–117)
BUN: 15 mg/dL (ref 6–23)
CO2: 30 mEq/L (ref 19–32)
Calcium: 9.3 mg/dL (ref 8.4–10.5)
Chloride: 102 mEq/L (ref 96–112)
Creatinine, Ser: 0.65 mg/dL (ref 0.40–1.20)
GFR: 92.46 mL/min (ref >60.00)
Glucose, Bld: 86 mg/dL (ref 70–99)
Potassium: 3.9 mEq/L (ref 3.5–5.1)
Sodium: 140 mEq/L (ref 135–145)
Total Bilirubin: 0.4 mg/dL (ref 0.2–1.2)
Total Protein: 6.7 g/dL (ref 6.0–8.3)

## 2021-06-02 LAB — AMYLASE: Amylase: 50 U/L (ref 27–131)

## 2021-06-02 LAB — LIPASE: Lipase: 27 U/L (ref 11.0–59.0)

## 2021-06-02 NOTE — Patient Instructions (Signed)

## 2021-06-02 NOTE — Progress Notes (Signed)
Subjective:   By signing my name below, I, [name], attest that this documentation has been prepared under the direction and in the presence of Dr. Roma Schanz, DO. 06/02/2021      Patient ID: Megan Murphy, female    DOB: 1956-08-04, 65 y.o.   MRN: [id number]  Chief Complaint  Patient presents with   Flank Pain    Left side pain, pt states having urgency. Pt states no burning.     HPI Patient is in today for a office visit.  She complains of pain in her upper left abdomen that radiates to her middle back for the past 6 weeks. She reports having no pain at this time. She also has frequency, urgency at this time. She denies having any burning, vaginal discharge, vaginal odors, hematuria, diarrhea or fevers at this time. She has no recent changes to her diet. She has received a flu vaccine last week at he [employer] [facility]. She is interested in getting a shingles vaccine at a later date.    Past Medical History:  Diagnosis Date   Basal cell cancer     X 2;Dr Tonia Brooms   History of migraine headaches    Hyperlipidemia    Hypertension     Past Surgical History:  Procedure Laterality Date   BASAL CELL CARCINOMA EXCISION      X 2   CESAREAN SECTION      X 2; G 2 P 2   CHOLECYSTECTOMY N/A 01/09/2018   Procedure: LAPAROSCOPIC CHOLECYSTECTOMY;  Surgeon: Clovis Riley, MD;  Location: [facility];  Service: General;  Laterality: N/A;   COLONOSCOPY  2012   Dr Olevia Perches, negative   dilation and curretage     Dr Ansel Bong TOOTH EXTRACTION      Family History  Problem Relation Age of Onset   Cancer Mother        ovarian cancer   Stroke Sister 1   Colon polyps Sister    Hypertension Sister    Stroke Brother 35   Hypertension Brother        X 3   Cancer Maternal Grandmother        bladder cancer   Heart attack Brother 96   Diabetes Neg Hx    Colon cancer Neg Hx    Esophageal cancer Neg Hx    Stomach cancer Neg Hx    Rectal cancer Neg Hx     Social  History   Socioeconomic History   Marital status: Married    Spouse name: Not on file   Number of children: Not on file   Years of education: Not on file   Highest education level: Not on file  Occupational History   Occupation: Psychologist, occupational: [employer]    Comment: retired  Tobacco Use   Smoking status: Never   Smokeless tobacco: Never  Vaping Use   Vaping Use: Never used  Substance and Sexual Activity   Alcohol use: Yes    Comment:  rarely   Drug use: No   Sexual activity: Yes  Other Topics Concern   Not on file  Social History Narrative   Exercise-- no   Social Determinants of Health   Financial Resource Strain: Not on file  Food Insecurity: Not on file  Transportation Needs: Not on file  Physical Activity: Not on file  Stress: Not on file  Social Connections: Not on file  Intimate Partner Violence: Not on file  Outpatient Medications Prior to Visit  Medication Sig Dispense Refill   Calcium Carbonate-Vitamin D (CALTRATE 600+D PO) Take 1 tablet by mouth daily.     Cholecalciferol (VITAMIN D3 PO) Take by mouth.     clobetasol ointment (TEMOVATE) 2.84 % Apply 1 application topically daily as needed (lichen sclerosus). 30 g 1   COLLAGEN PO Take 1 Dose by mouth daily. In powder form     losartan (COZAAR) 25 MG tablet Take 1 tablet (25 mg total) by mouth daily. 30 tablet 0   OVER THE COUNTER MEDICATION Take 1 Dose by mouth daily. Vitacup vitamin infused coffee     OVER THE COUNTER MEDICATION Baobab otc vitamin powder Mix 2 teaspoons with liquid once daily     Protein POWD Take 1 Dose by mouth daily.     verapamil (CALAN-SR) 180 MG CR tablet TAKE TWO TABLETS BY MOUTH DAILY AT BEDTIME 60 tablet 0   No facility-administered medications prior to visit.    Allergies  Allergen Reactions   Lisinopril     cough    Review of Systems  Constitutional:  Positive for fever. Negative for malaise/fatigue.  HENT:  Negative for congestion.   Eyes:   Negative for blurred vision.  Respiratory:  Negative for shortness of breath.   Cardiovascular:  Negative for chest pain, palpitations and leg swelling.  Gastrointestinal:  Positive for abdominal pain (Upper left). Negative for blood in stool and nausea.  Genitourinary:  Positive for frequency and urgency. Negative for dysuria and hematuria.       (-)vaginal discharge (-)Vaginal odor  Musculoskeletal:  Positive for back pain (middle). Negative for falls.  Skin:  Negative for rash.  Neurological:  Negative for dizziness, loss of consciousness and headaches.  Endo/Heme/Allergies:  Negative for environmental allergies.  Psychiatric/Behavioral:  Negative for depression. The patient is not nervous/anxious.       Objective:    Physical Exam Vitals and nursing note reviewed.  Constitutional:      General: She is not in acute distress.    Appearance: Normal appearance. She is well-developed. She is not ill-appearing.  HENT:     Head: Normocephalic and atraumatic.     Right Ear: External ear normal.     Left Ear: External ear normal.  Neck:     Thyroid: No thyromegaly.     Vascular: No carotid bruit or JVD.  Cardiovascular:     Rate and Rhythm: Normal rate and regular rhythm.     Heart sounds: Normal heart sounds. No murmur heard.   No gallop.  Pulmonary:     Effort: Pulmonary effort is normal. No respiratory distress.     Breath sounds: Normal breath sounds. No wheezing or rales.  Chest:     Chest wall: No tenderness.  Abdominal:     General: There is no distension.     Palpations: Abdomen is soft.     Tenderness: There is no abdominal tenderness. There is no right CVA tenderness, left CVA tenderness, guarding or rebound.  Musculoskeletal:     Cervical back: Normal range of motion and neck supple.  Skin:    General: Skin is warm and dry.  Neurological:     Mental Status: She is alert and oriented to person, place, and time.  Psychiatric:        Behavior: Behavior normal.         Judgment: Judgment normal.    BP 120/80 (BP Location: Left Arm, Patient Position: Sitting, Cuff Size: Normal)   Pulse  76   Temp 98 F (36.7 C) (Oral)   Resp 18   Ht 5\' 3"  (1.6 m)   Wt 142 lb 3.2 oz (64.5 kg)   SpO2 97%   BMI 25.19 kg/m  Wt Readings from Last 3 Encounters:  06/02/21 142 lb 3.2 oz (64.5 kg)  11/09/20 166 lb 3.2 oz (75.4 kg)  10/29/20 172 lb (78 kg)    Diabetic Foot Exam - Simple   No data filed    Lab Results  Component Value Date   WBC 5.3 11/09/2020   HGB 13.1 11/09/2020   HCT 38.9 11/09/2020   PLT 276.0 11/09/2020   GLUCOSE 83 11/09/2020   CHOL 219 (H) 11/09/2020   TRIG 66.0 11/09/2020   HDL 75.20 11/09/2020   LDLDIRECT 134.3 03/13/2012   LDLCALC 130 (H) 11/09/2020   ALT 11 11/09/2020   AST 13 11/09/2020   NA 139 11/09/2020   K 4.0 11/09/2020   CL 102 11/09/2020   CREATININE 0.75 11/09/2020   BUN 14 11/09/2020   CO2 26 11/09/2020   TSH 1.93 11/09/2020   HGBA1C 5.5 03/07/2011    Lab Results  Component Value Date   TSH 1.93 11/09/2020   Lab Results  Component Value Date   WBC 5.3 11/09/2020   HGB 13.1 11/09/2020   HCT 38.9 11/09/2020   MCV 93.8 11/09/2020   PLT 276.0 11/09/2020   Lab Results  Component Value Date   NA 139 11/09/2020   K 4.0 11/09/2020   CO2 26 11/09/2020   GLUCOSE 83 11/09/2020   BUN 14 11/09/2020   CREATININE 0.75 11/09/2020   BILITOT 0.5 11/09/2020   ALKPHOS 88 11/09/2020   AST 13 11/09/2020   ALT 11 11/09/2020   PROT 6.5 11/09/2020   ALBUMIN 4.0 11/09/2020   CALCIUM 9.0 11/09/2020   ANIONGAP 10 01/03/2018   GFR 83.94 11/09/2020   Lab Results  Component Value Date   CHOL 219 (H) 11/09/2020   Lab Results  Component Value Date   HDL 75.20 11/09/2020   Lab Results  Component Value Date   LDLCALC 130 (H) 11/09/2020   Lab Results  Component Value Date   TRIG 66.0 11/09/2020   Lab Results  Component Value Date   CHOLHDL 3 11/09/2020   Lab Results  Component Value Date   HGBA1C 5.5  03/07/2011       Assessment & Plan:   Problem List Items Addressed This Visit       Unprioritized   Left upper quadrant abdominal pain - Primary    ? Etiology Check labs and [place] rto prn       Relevant Orders   CBC with Differential/Platelet   Comprehensive metabolic panel   Lipase   Amylase   US Abdomen Complete   Urine Culture   POCT Urinalysis Dipstick (Automated) (Completed)     No orders of the defined types were placed in this encounter.   I, Dr. Roma Schanz, DO, personally preformed the services described in this documentation.  All medical record entries made by the scribe were at my direction and in my presence.  I have reviewed the chart and discharge instructions (if applicable) and agree that the record reflects my personal performance and is accurate and complete. 06/02/2021   I,[name],acting as a scribe for Ann Held, DO.,have documented all relevant documentation on the behalf of Ann Held, DO,as directed by  Ann Held, DO while in the presence of Alferd Apa  Lowne Chase, DO.   Ann Held, DO

## 2021-06-02 NOTE — Assessment & Plan Note (Signed)
?   Etiology Check labs and [place] rto prn

## 2021-06-03 ENCOUNTER — Ambulatory Visit (HOSPITAL_BASED_OUTPATIENT_CLINIC_OR_DEPARTMENT_OTHER)
Admission: RE | Admit: 2021-06-03 | Discharge: 2021-06-03 | Disposition: A | Discharge status: Home / Self Care | Payer: Medicare Other | Source: Ambulatory Visit | Attending: Family Medicine | Admitting: Family Medicine

## 2021-06-03 DIAGNOSIS — R1012 Left upper quadrant pain: Secondary | ICD-10-CM | POA: Diagnosis present

## 2021-06-03 LAB — URINE CULTURE
MICRO NUMBER:: 12499097
Result:: NO GROWTH
SPECIMEN QUALITY:: ADEQUATE

## 2021-06-30 ENCOUNTER — Other Ambulatory Visit: Payer: Self-pay | Admitting: Family Medicine

## 2021-06-30 DIAGNOSIS — I10 Essential (primary) hypertension: Secondary | ICD-10-CM

## 2021-07-04 ENCOUNTER — Encounter: Payer: Self-pay | Admitting: Family Medicine

## 2021-07-04 DIAGNOSIS — I10 Essential (primary) hypertension: Secondary | ICD-10-CM

## 2021-07-05 MED ORDER — VERAPAMIL HCL ER 180 MG PO TBCR: EXTENDED_RELEASE_TABLET | ORAL | 1 refills | Status: DC

## 2021-07-06 NOTE — Telephone Encounter (Signed)
Okay with this change?

## 2021-10-25 ENCOUNTER — Telehealth: Payer: Self-pay | Admitting: Family Medicine

## 2021-10-25 DIAGNOSIS — I10 Essential (primary) hypertension: Secondary | ICD-10-CM

## 2021-11-23 MED ORDER — LOSARTAN POTASSIUM 25 MG PO TABS: 25.0000 mg | ORAL_TABLET | Freq: Every day | ORAL | 0 refills | Status: DC

## 2021-11-23 NOTE — Telephone Encounter (Signed)
Patient states she never stopped taking the medication, she just still had pills to last her so she didn't need a refill at the time they offered the refill.  ? ?Medication: Losartan ? ?Has the patient contacted their pharmacy? Yes.   ? ?Preferred Pharmacy (with phone number or street name):  ?[facility] # [address]  ?[place], [place] [address]  ?Phone:  [phone]  Fax:  [phone]  ? ?Agent: Please be advised that RX refills may take up to 3 business days. We ask that you follow-up with your pharmacy.  ?

## 2021-11-23 NOTE — Addendum Note (Signed)
Addended by: Sanda Linger on: 11/23/2021 03:33 PM ? ? Modules accepted: Orders ? ?

## 2021-11-23 NOTE — Telephone Encounter (Signed)
Refill sent.

## 2021-12-20 ENCOUNTER — Encounter: Payer: Self-pay | Admitting: Family Medicine

## 2021-12-20 ENCOUNTER — Ambulatory Visit (INDEPENDENT_AMBULATORY_CARE_PROVIDER_SITE_OTHER): Payer: Medicare Other | Admitting: Family Medicine

## 2021-12-20 ENCOUNTER — Ambulatory Visit: Payer: Federal, State, Local not specified - PPO

## 2021-12-20 VITALS — BP 120/80 | HR 67 | Temp 97.8°F | Resp 16 | Ht 63.0 in | Wt 144.4 lb

## 2021-12-20 DIAGNOSIS — Z136 Encounter for screening for cardiovascular disorders: Secondary | ICD-10-CM

## 2021-12-20 DIAGNOSIS — Z0000 Encounter for general adult medical examination without abnormal findings: Secondary | ICD-10-CM | POA: Diagnosis not present

## 2021-12-20 DIAGNOSIS — R1012 Left upper quadrant pain: Secondary | ICD-10-CM

## 2021-12-20 DIAGNOSIS — Z23 Encounter for immunization: Secondary | ICD-10-CM | POA: Diagnosis not present

## 2021-12-20 DIAGNOSIS — I10 Essential (primary) hypertension: Secondary | ICD-10-CM

## 2021-12-20 LAB — CBC WITH DIFFERENTIAL/PLATELET
Basophils Absolute: 0 10*3/uL (ref 0.0–0.1)
Basophils Relative: 0.2 % (ref 0.0–3.0)
Eosinophils Absolute: 0.2 10*3/uL (ref 0.0–0.7)
Eosinophils Relative: 4.8 % (ref 0.0–5.0)
HCT: 42.2 % (ref 36.0–46.0)
Hemoglobin: 14.1 g/dL (ref 12.0–15.0)
Lymphocytes Relative: 36.3 % (ref 12.0–46.0)
Lymphs Abs: 1.7 10*3/uL (ref 0.7–4.0)
MCHC: 33.3 g/dL (ref 30.0–36.0)
MCV: 96.6 fl (ref 78.0–100.0)
Monocytes Absolute: 0.3 10*3/uL (ref 0.1–1.0)
Monocytes Relative: 5.7 % (ref 3.0–12.0)
Neutro Abs: 2.5 10*3/uL (ref 1.4–7.7)
Neutrophils Relative %: 53 % (ref 43.0–77.0)
Platelets: 265 10*3/uL (ref 150.0–400.0)
RBC: 4.37 Mil/uL (ref 3.87–5.11)
RDW: 12.4 % (ref 11.5–15.5)
WBC: 4.7 10*3/uL (ref 4.0–10.5)

## 2021-12-20 LAB — LIPID PANEL
Cholesterol: 271 mg/dL — ABNORMAL HIGH (ref 0–200)
HDL: 94.7 mg/dL (ref >39.00)
LDL Cholesterol: 157 mg/dL — ABNORMAL HIGH (ref 0–99)
NonHDL: 176.58
Total CHOL/HDL Ratio: 3
Triglycerides: 99 mg/dL (ref 0.0–149.0)
VLDL: 19.8 mg/dL (ref 0.0–40.0)

## 2021-12-20 LAB — COMPREHENSIVE METABOLIC PANEL
ALT: 14 U/L (ref 0–35)
AST: 14 U/L (ref 0–37)
Albumin: 4 g/dL (ref 3.5–5.2)
Alkaline Phosphatase: 95 U/L (ref 39–117)
BUN: 16 mg/dL (ref 6–23)
CO2: 31 mEq/L (ref 19–32)
Calcium: 9.1 mg/dL (ref 8.4–10.5)
Chloride: 104 mEq/L (ref 96–112)
Creatinine, Ser: 0.68 mg/dL (ref 0.40–1.20)
GFR: 91.11 mL/min (ref >60.00)
Glucose, Bld: 88 mg/dL (ref 70–99)
Potassium: 4 mEq/L (ref 3.5–5.1)
Sodium: 139 mEq/L (ref 135–145)
Total Bilirubin: 0.4 mg/dL (ref 0.2–1.2)
Total Protein: 6.7 g/dL (ref 6.0–8.3)

## 2021-12-20 MED ORDER — VERAPAMIL HCL ER 180 MG PO TBCR: EXTENDED_RELEASE_TABLET | ORAL | 1 refills | Status: DC

## 2021-12-20 MED ORDER — LOSARTAN POTASSIUM 25 MG PO TABS: 25.0000 mg | ORAL_TABLET | Freq: Every day | ORAL | 1 refills | Status: DC

## 2021-12-20 NOTE — Patient Instructions (Signed)
Preventive Care 7 Years and Older, Female ?Preventive care refers to lifestyle choices and visits with your health care provider that can promote health and wellness. Preventive care visits are also called wellness exams. ?What can I expect for my preventive care visit? ?Counseling ?Your health care provider may ask you questions about your: ?Medical history, including: ?Past medical problems. ?Family medical history. ?Pregnancy and menstrual history. ?History of falls. ?Current health, including: ?Memory and ability to understand (cognition). ?Emotional well-being. ?Home life and relationship well-being. ?Sexual activity and sexual health. ?Lifestyle, including: ?Alcohol, nicotine or tobacco, and drug use. ?Access to firearms. ?Diet, exercise, and sleep habits. ?Work and work Statistician. ?Sunscreen use. ?Safety issues such as seatbelt and bike helmet use. ?Physical exam ?Your health care provider will check your: ?Height and weight. These may be used to calculate your BMI (body mass index). BMI is a measurement that tells if you are at a healthy weight. ?Waist circumference. This measures the distance around your waistline. This measurement also tells if you are at a healthy weight and may help predict your risk of certain diseases, such as type 2 diabetes and high blood pressure. ?Heart rate and blood pressure. ?Body temperature. ?Skin for abnormal spots. ?What immunizations do I need? ? ?Vaccines are usually given at various ages, according to a schedule. Your health care provider will recommend vaccines for you based on your age, medical history, and lifestyle or other factors, such as travel or where you work. ?What tests do I need? ?Screening ?Your health care provider may recommend screening tests for certain conditions. This may include: ?Lipid and cholesterol levels. ?Hepatitis C test. ?Hepatitis B test. ?HIV (human immunodeficiency virus) test. ?STI (sexually transmitted infection) testing, if you are at  risk. ?Lung cancer screening. ?Colorectal cancer screening. ?Diabetes screening. This is done by checking your blood sugar (glucose) after you have not eaten for a while (fasting). ?Mammogram. Talk with your health care provider about how often you should have regular mammograms. ?BRCA-related cancer screening. This may be done if you have a family history of breast, ovarian, tubal, or peritoneal cancers. ?Bone density scan. This is done to screen for osteoporosis. ?Talk with your health care provider about your test results, treatment options, and if necessary, the need for more tests. ?Follow these instructions at home: ?Eating and drinking ? ?Eat a diet that includes fresh fruits and vegetables, whole grains, lean protein, and low-fat dairy products. Limit your intake of foods with high amounts of sugar, saturated fats, and salt. ?Take vitamin and mineral supplements as recommended by your health care provider. ?Do not drink alcohol if your health care provider tells you not to drink. ?If you drink alcohol: ?Limit how much you have to 0-1 drink a day. ?Know how much alcohol is in your drink. In the [place] one drink equals one 12 oz bottle of beer (355 mL), one 5 oz glass of wine (148 mL), or one 1? oz glass of hard liquor (44 mL). ?Lifestyle ?Brush your teeth every morning and night with fluoride toothpaste. Floss one time each day. ?Exercise for at least 30 minutes 5 or more days each week. ?Do not use any products that contain nicotine or tobacco. These products include cigarettes, chewing tobacco, and vaping devices, such as e-cigarettes. If you need help quitting, ask your health care provider. ?Do not use drugs. ?If you are sexually active, practice safe sex. Use a condom or other form of protection in order to prevent STIs. ?Take aspirin only as told by  your health care provider. Make sure that you understand how much to take and what form to take. Work with your health care provider to find out whether it  is safe and beneficial for you to take aspirin daily. ?Ask your health care provider if you need to take a cholesterol-lowering medicine (statin). ?Find healthy ways to manage stress, such as: ?Meditation, yoga, or listening to music. ?Journaling. ?Talking to a trusted person. ?Spending time with friends and family. ?Minimize exposure to UV radiation to reduce your risk of skin cancer. ?Safety ?Always wear your seat belt while driving or riding in a vehicle. ?Do not drive: ?If you have been drinking alcohol. Do not ride with someone who has been drinking. ?When you are tired or distracted. ?While texting. ?If you have been using any mind-altering substances or drugs. ?Wear a helmet and other protective equipment during sports activities. ?If you have firearms in your house, make sure you follow all gun safety procedures. ?What's next? ?Visit your health care provider once a year for an annual wellness visit. ?Ask your health care provider how often you should have your eyes and teeth checked. ?Stay up to date on all vaccines. ?This information is not intended to replace advice given to you by your health care provider. Make sure you discuss any questions you have with your health care provider. ?Document Revised: 02/02/2021 Document Reviewed: 02/02/2021 ?Elsevier Patient Education ? [place]. ? ?

## 2021-12-20 NOTE — Progress Notes (Signed)
? ?Subjective:  ? ? Megan Murphy is a 66 y.o. female who presents for a Welcome to Medicare exam.  ? ?Review of Systems ? ?She denies falls. She is not depressed at this time. ? ?She complains of pain in the left side of her stomach. The pain is localized under the ribcage. Notes the pain is not very severe but is intermittent. She adds that the pain appears when she is working in the yard or the kitchen. ? ? No changes in family history. No recent surgeries. ?    ?Objective:  ?  ?Today's Vitals  ? 12/20/21 0856  ?BP: 120/80  ?Pulse: 67  ?Resp: 16  ?Temp: 97.8 ?F (36.6 ?C)  ?TempSrc: Oral  ?SpO2: 99%  ?Weight: 144 lb 6.4 oz (65.5 kg)  ?Height: '5\' 3"'$  (1.6 m)  ?Body mass index is 25.58 kg/m?. ? ?Medications ?Outpatient Encounter Medications as of 12/20/2021  ?Medication Sig  ? Calcium Carbonate-Vitamin D (CALTRATE 600+D PO) Take 1 tablet by mouth daily.  ? Cholecalciferol (VITAMIN D3 PO) Take by mouth.  ? clobetasol ointment (TEMOVATE) 3.71 % Apply 1 application topically daily as needed (lichen sclerosus).  ? COLLAGEN PO Take 1 Dose by mouth daily. In powder form  ? OVER THE COUNTER MEDICATION Take 1 Dose by mouth daily. Vitacup vitamin infused coffee  ? OVER THE COUNTER MEDICATION Baobab otc vitamin powder Mix 2 teaspoons with liquid once daily  ? Protein POWD Take 1 Dose by mouth daily.  ? [DISCONTINUED] losartan (COZAAR) 25 MG tablet Take 1 tablet (25 mg total) by mouth daily. Pt needs office visit for further refills  ? [DISCONTINUED] verapamil (CALAN-SR) 180 MG CR tablet TAKE TWO TABLETS BY MOUTH DAILY AT BEDTIME  ? losartan (COZAAR) 25 MG tablet Take 1 tablet (25 mg total) by mouth daily. Pt needs office visit for further refills  ? verapamil (CALAN-SR) 180 MG CR tablet TAKE TWO TABLETS BY MOUTH DAILY AT BEDTIME  ? ?No facility-administered encounter medications on file as of 12/20/2021.  ?  ? ?History: ?Past Medical History:  ?Diagnosis Date  ? Basal cell cancer   ?  X 2;Dr Tonia Brooms  ? History of migraine  headaches   ? Hyperlipidemia   ? Hypertension   ? ?Past Surgical History:  ?Procedure Laterality Date  ? BASAL CELL CARCINOMA EXCISION    ?  X 2  ? CESAREAN SECTION    ?  X 2; G 2 P 2  ? CHOLECYSTECTOMY N/A 01/09/2018  ? Procedure: LAPAROSCOPIC CHOLECYSTECTOMY;  Surgeon: Clovis Riley, MD;  Location: [facility];  Service: General;  Laterality: N/A;  ? COLONOSCOPY  2012  ? Dr Olevia Perches, negative  ? dilation and curretage    ? Dr Dellis Filbert  ? WISDOM TOOTH EXTRACTION    ?  ?Family History  ?Problem Relation Age of Onset  ? Cancer Mother   ?     ovarian cancer  ? Stroke Sister 26  ? Colon polyps Sister   ? Hypertension Sister   ? Stroke Brother 82  ? Hypertension Brother   ?     X 3  ? Cancer Maternal Grandmother   ?     bladder cancer  ? Heart attack Brother 48  ? Diabetes Neg Hx   ? Colon cancer Neg Hx   ? Esophageal cancer Neg Hx   ? Stomach cancer Neg Hx   ? Rectal cancer Neg Hx   ? ?Social History  ? ?Occupational History  ? Occupation: human resources  ?  Employer: [employer]  ?  Comment: retired  ?Tobacco Use  ? Smoking status: Never  ? Smokeless tobacco: Never  ?Vaping Use  ? Vaping Use: Never used  ?Substance and Sexual Activity  ? Alcohol use: Yes  ?  Comment:  rarely  ? Drug use: No  ? Sexual activity: Yes  ? ? ?Tobacco Counseling ?Counseling given: Not Answered ? ? ?Immunizations and Health Maintenance ? ?She will receive the pneumonia vaccine today. She has 3 Covid-19 vaccines at this time. She will schedule the shingles vaccine at the pharmacy. ?Immunization History  ?Administered Date(s) Administered  ? Influenza Inj Mdck Quad Pf 06/18/2017, 05/30/2018  ? Influenza-Unspecified 04/15/2015, 05/11/2016, 05/25/2019, 06/04/2020  ? PFIZER(Purple Top)SARS-COV-2 Vaccination 10/22/2019, 06/04/2020  ? PNEUMOCOCCAL CONJUGATE-20 12/20/2021  ? Tdap 01/12/2012  ? ?Health Maintenance Due  ?Topic Date Due  ? Zoster Vaccines- Shingrix (1 of 2) Never done  ? PAP SMEAR-Modifier  04/06/2018  ? COVID-19 Vaccine (3 - Booster for  Pfizer series) 07/30/2020  ? DEXA SCAN  Never done  ? ? ?Activities of Daily Living ? ?  06/02/2021  ?  2:26 PM  ?In your present state of health, do you have any difficulty performing the following activities:  ?Hearing? 0  ?Vision? 0  ?Difficulty concentrating or making decisions? 0  ?Walking or climbing stairs? 0  ?Dressing or bathing? 0  ?Doing errands, shopping? 0  ? ? ?Physical Exam(optional), or other factors deemed appropriate based on the beneficiary's medical and social history and current clinical standards. ? ?Advanced Directives: ?Does Patient Have a Medical Advance Directive?: No ?Would patient like information on creating a medical advance directive?: Yes (MAU/Ambulatory/Procedural Areas - Information given)She does not have a living will or power of attorney at this time.  Pamphlet given at today's visit. ?   ?Assessment:  ?  ?This is a routine wellness examination for this patient .  ? ?Vision/Hearing screen ?Hearing Screening  ? '500Hz'$  '1000Hz'$  '2000Hz'$  '4000Hz'$   ?Right ear Pass Pass Fail Fail  ?Left ear Fail Fail Pass Pass  ? ?Vision Screening  ? Right eye Left eye Both eyes  ?Without correction '20/20 20/20 20/20 '$  ?With correction     ? ?UTD with vision and dental. ? ?Dietary issues and exercise activities discussed:  ?  She exercises by walking some days.  ? ? Goals   ?None ?  ? ?Depression Screen ? ?  12/20/2021  ?  9:18 AM 06/02/2021  ?  2:26 PM  ?PHQ 2/9 Scores  ?PHQ - 2 Score 0 0  ?PHQ- 9 Score 0   ?  ? ?Fall Risk ? ?  12/20/2021  ?  9:18 AM  ?Fall Risk   ?Falls in the past year? 0  ?Number falls in past yr: 0  ?Injury with Fall? 0  ?Risk for fall due to : No Fall Risks  ?Follow up Falls evaluation completed  ? ? ?Cognitive Function: ?  ?  ?  ? ?Patient Care Team: ?Carollee Herter, Alferd Apa, DO as PCP - General (Family Medicine) ?Charyl Bigger, MD as Consulting Physician (Obstetrics and Gynecology) ?Haverstock, Jennefer Bravo, MD as Referring Physician (Dermatology) ? ?   ?Plan:  ? 1. Preventative health  care ?See above ? ?2. Welcome to Medicare preventive visit ? ? ?- EKG 12-Lead ?- CBC with Differential/Platelet ?- Comprehensive metabolic panel ?- Lipid panel ? ?3. Essential hypertension ? ? ?- verapamil (CALAN-SR) 180 MG CR tablet; TAKE TWO TABLETS BY MOUTH DAILY AT BEDTIME  Dispense:  180 tablet; Refill: 1 ?- CBC with Differential/Platelet ?- Comprehensive metabolic panel ?- Lipid panel ? ?4. Primary hypertension ? ? ?- losartan (COZAAR) 25 MG tablet; Take 1 tablet (25 mg total) by mouth daily. Pt needs office visit for further refills  Dispense: 90 tablet; Refill: 1 ? ?5. Need for pneumococcal vaccination ? ? ?- Pneumococcal conjugate vaccine 20-valent (Prevnar 20) ? ?6. Left upper quadrant abdominal pain ? ? ?- CT Abdomen Pelvis W Contrast; Future ? ?7. Encounter for Medicare annual wellness exam ? ? ? ? ?I have personally reviewed and noted the following in the patient?s chart:  ? ?Medical and social history ?Use of alcohol, tobacco or illicit drugs  ?Current medications and supplements ?Functional ability and status ?Nutritional status ?Physical activity ?Advanced directives ?List of other physicians ?Hospitalizations, surgeries, and ER visits in previous 12 months ?Vitals ?Screenings to include cognitive, depression, and falls ?Referrals and appointments ? ?In addition, I have reviewed and discussed with patient certain preventive protocols, quality metrics, and best practice recommendations. A written personalized care plan for preventive services as well as general preventive health recommendations were provided to patient. ? ?  ? ?I,Zite Okoli,acting as a Education administrator for [employer], DO.,have documented all relevant documentation on the behalf of Ann Held, DO,as directed by  Ann Held, DO while in the presence of Ann Held, DO.  ? ?I, Ann Held, DO, have reviewed all documentation for this visit. The documentation on 12/20/21 for the exam, diagnosis,  procedures, and orders are all accurate and complete.  ? ?Ann Held, DO 12/20/2021 ? ? ? ? ?

## 2021-12-20 NOTE — Progress Notes (Signed)
? ?Subjective:  ? ? Megan Murphy is a 66 y.o. female who presents for a Welcome to Medicare exam.  ? ?Review of Systems ?Review of Systems  ?Constitutional: Negative for activity change, appetite change and fatigue.  ?HENT: Negative for hearing loss, congestion, tinnitus and ear discharge.  dentist q50m?Eyes: Negative for visual disturbance (see optho q1y -- vision corrected to 20/20 with glasses).  ?Respiratory: Negative for cough, chest tightness and shortness of breath.   ?Cardiovascular: Negative for chest pain, palpitations and leg swelling.  ?Gastrointestinal: Negative for abdominal pain, diarrhea, constipation and abdominal distention.  ?Genitourinary: Negative for urgency, frequency, decreased urine volume and difficulty urinating.  ?Musculoskeletal: Negative for back pain, arthralgias and gait problem.  ?Skin: Negative for color change, pallor and rash.  ?Neurological: Negative for dizziness, light-headedness, numbness and headaches.  ?Hematological: Negative for adenopathy. Does not bruise/bleed easily.  ?Psychiatric/Behavioral: Negative for suicidal ideas, confusion, sleep disturbance, self-injury, dysphoric mood, decreased concentration and agitation.  ? ? ? ?  ? ?    ?Objective:  ?  ?Today's Vitals  ? 12/20/21 0856  ?BP: 120/80  ?Pulse: 67  ?Resp: 16  ?Temp: 97.8 ?F (36.6 ?C)  ?TempSrc: Oral  ?SpO2: 99%  ?Weight: 144 lb 6.4 oz (65.5 kg)  ?Height: '5\' 3"'$  (1.6 m)  ?Body mass index is 25.58 kg/m?. ? ?Medications ?Outpatient Encounter Medications as of 12/20/2021  ?Medication Sig  ? Calcium Carbonate-Vitamin D (CALTRATE 600+D PO) Take 1 tablet by mouth daily.  ? Cholecalciferol (VITAMIN D3 PO) Take by mouth.  ? clobetasol ointment (TEMOVATE) 02.42% Apply 1 application topically daily as needed (lichen sclerosus).  ? COLLAGEN PO Take 1 Dose by mouth daily. In powder form  ? OVER THE COUNTER MEDICATION Take 1 Dose by mouth daily. Vitacup vitamin infused coffee  ? OVER THE COUNTER MEDICATION Baobab otc  vitamin powder Mix 2 teaspoons with liquid once daily  ? Protein POWD Take 1 Dose by mouth daily.  ? [DISCONTINUED] losartan (COZAAR) 25 MG tablet Take 1 tablet (25 mg total) by mouth daily. Pt needs office visit for further refills  ? [DISCONTINUED] verapamil (CALAN-SR) 180 MG CR tablet TAKE TWO TABLETS BY MOUTH DAILY AT BEDTIME  ? losartan (COZAAR) 25 MG tablet Take 1 tablet (25 mg total) by mouth daily. Pt needs office visit for further refills  ? verapamil (CALAN-SR) 180 MG CR tablet TAKE TWO TABLETS BY MOUTH DAILY AT BEDTIME  ? ?No facility-administered encounter medications on file as of 12/20/2021.  ?  ? ?History: ?Past Medical History:  ?Diagnosis Date  ? Basal cell cancer   ?  X 2;Dr GTonia Brooms ? History of migraine headaches   ? Hyperlipidemia   ? Hypertension   ? ?Past Surgical History:  ?Procedure Laterality Date  ? BASAL CELL CARCINOMA EXCISION    ?  X 2  ? CESAREAN SECTION    ?  X 2; G 2 P 2  ? CHOLECYSTECTOMY N/A 01/09/2018  ? Procedure: LAPAROSCOPIC CHOLECYSTECTOMY;  Surgeon: CClovis Riley MD;  Location: [facility];  Service: General;  Laterality: N/A;  ? COLONOSCOPY  2012  ? Dr BOlevia Perches negative  ? dilation and curretage    ? Dr LDellis Filbert ? WISDOM TOOTH EXTRACTION    ?  ?Family History  ?Problem Relation Age of Onset  ? Cancer Mother   ?     ovarian cancer  ? Stroke Sister 479 ? Colon polyps Sister   ? Hypertension Sister   ? Stroke Brother  20  ? Hypertension Brother   ?     X 3  ? Cancer Maternal Grandmother   ?     bladder cancer  ? Heart attack Brother 70  ? Diabetes Neg Hx   ? Colon cancer Neg Hx   ? Esophageal cancer Neg Hx   ? Stomach cancer Neg Hx   ? Rectal cancer Neg Hx   ? ?Social History  ? ?Occupational History  ? Occupation: human resources  ?  Employer: [employer]  ?  Comment: retired  ?Tobacco Use  ? Smoking status: Never  ? Smokeless tobacco: Never  ?Vaping Use  ? Vaping Use: Never used  ?Substance and Sexual Activity  ? Alcohol use: Yes  ?  Comment:  rarely  ? Drug use: No  ? Sexual  activity: Yes  ? ? ?Tobacco Counseling ?Counseling given: Not Answered ? ? ?Immunizations and Health Maintenance ?Immunization History  ?Administered Date(s) Administered  ? Influenza Inj Mdck Quad Pf 06/18/2017, 05/30/2018  ? Influenza-Unspecified 04/15/2015, 05/11/2016, 05/25/2019, 06/04/2020  ? PFIZER(Purple Top)SARS-COV-2 Vaccination 10/22/2019, 06/04/2020  ? PNEUMOCOCCAL CONJUGATE-20 12/20/2021  ? Tdap 01/12/2012  ? ? ? ?Physical Exam BP 120/80 (BP Location: Left Arm, Patient Position: Sitting, Cuff Size: Normal)   Pulse 67   Temp 97.8 ?F (36.6 ?C) (Oral)   Resp 16   Ht '5\' 3"'$  (1.6 m)   Wt 144 lb 6.4 oz (65.5 kg)   SpO2 99%   BMI 25.58 kg/m?  ?General appearance: alert, cooperative, appears stated age, and no distress ?Head: Normocephalic, without obvious abnormality, atraumatic ?Eyes: conjunctivae/corneas clear. PERRL, EOM's intact. Fundi benign. ?Ears: normal TM's and external ear canals both ears ?Nose: Nares normal. Septum midline. Mucosa normal. No drainage or sinus tenderness. ?Throat: lips, mucosa, and tongue normal; teeth and gums normal ?Neck: no adenopathy, no carotid bruit, no JVD, supple, symmetrical, trachea midline, and thyroid not enlarged, symmetric, no tenderness/mass/nodules ?Back: symmetric, no curvature. ROM normal. No CVA tenderness. ?Lungs: clear to auscultation bilaterally ?Heart: regular rate and rhythm, S1, S2 normal, no murmur, click, rub or gallop ?Abdomen: soft, non-tender; bowel sounds normal; no masses,  no organomegaly ?Extremities: extremities normal, atraumatic, no cyanosis or edema ?Pulses: 2+ and symmetric ?Skin: Skin color, texture, turgor normal. No rashes or lesions ?Lymph nodes: Cervical, supraclavicular, and axillary nodes normal. ?Neurologic: Alert and oriented X 3, normal strength and tone. Normal symmetric reflexes. Normal coordination and gait  ? ?Advanced Directives: ?Does Patient Have a Medical Advance Directive?: No ?Would patient like information on creating  a medical advance directive?: Yes (MAU/Ambulatory/Procedural Areas - Information given) ?   ?Assessment:  ?  ?This is a routine wellness examination for this patient .  ? ?Vision/Hearing screen ?Hearing Screening  ? '500Hz'$  '1000Hz'$  '2000Hz'$  '4000Hz'$   ?Right ear Pass Pass Fail Fail  ?Left ear Fail Fail Pass Pass  ? ?Vision Screening  ? Right eye Left eye Both eyes  ?Without correction '20/20 20/20 20/20 '$  ?With correction     ? ? ?Dietary issues and exercise activities discussed:  ?  ? ? Goals   ?None ?  ? ?Depression Screen ? ?  12/20/2021  ?  9:18 AM 06/02/2021  ?  2:26 PM  ?PHQ 2/9 Scores  ?PHQ - 2 Score 0 0  ?PHQ- 9 Score 0   ?  ? ?Fall Risk ? ?  12/20/2021  ?  9:18 AM  ?Fall Risk   ?Falls in the past year? 0  ?Number falls in past yr: 0  ?  Injury with Fall? 0  ?Risk for fall due to : No Fall Risks  ?Follow up Falls evaluation completed  ? ? ?Cognitive Function: ? Mmse 39/30 ?  ?  ? ?Patient Care Team: ?Carollee Herter, Alferd Apa, DO as PCP - General (Family Medicine) ?Charyl Bigger, MD as Consulting Physician (Obstetrics and Gynecology) ?Haverstock, Jennefer Bravo, MD as Referring Physician (Dermatology) ? ?   ?Plan:  ? 1. Preventative health care ?See above  ? ?2. Welcome to Medicare preventive visit ?Ekg--nsr  ? ?- EKG 12-Lead ?- CBC with Differential/Platelet ?- Comprehensive metabolic panel ?- Lipid panel ? ?3. Essential hypertension ?Well controlled, no changes to meds. Encouraged heart healthy diet such as the DASH diet and exercise as tolerated.   ?- verapamil (CALAN-SR) 180 MG CR tablet; TAKE TWO TABLETS BY MOUTH DAILY AT BEDTIME  Dispense: 180 tablet; Refill: 1 ?- CBC with Differential/Platelet ?- Comprehensive metabolic panel ?- Lipid panel ? ?4. Primary hypertension ? ?- losartan (COZAAR) 25 MG tablet; Take 1 tablet (25 mg total) by mouth daily. Pt needs office visit for further refills  Dispense: 90 tablet; Refill: 1 ? ?5. Need for pneumococcal vaccination ? ? ?- Pneumococcal conjugate vaccine 20-valent (Prevnar  20) ? ?6. Left upper quadrant abdominal pain ?Con't LUQ pain  ? ?- CT Abdomen Pelvis W Contrast; Future ? ?7. Encounter for Medicare annual wellness exam ?See above  ? ? ?I have personally reviewed and noted the follow

## 2021-12-23 ENCOUNTER — Ambulatory Visit (HOSPITAL_BASED_OUTPATIENT_CLINIC_OR_DEPARTMENT_OTHER)
Admission: RE | Admit: 2021-12-23 | Discharge: 2021-12-23 | Disposition: A | Discharge status: Home / Self Care | Payer: Medicare Other | Source: Ambulatory Visit | Attending: Family Medicine | Admitting: Family Medicine

## 2021-12-23 DIAGNOSIS — R1012 Left upper quadrant pain: Secondary | ICD-10-CM | POA: Insufficient documentation

## 2021-12-23 MED ORDER — IOHEXOL 300 MG/ML  SOLN
100.0000 mL | Freq: Once | INTRAMUSCULAR | Status: AC | PRN
  Administered 2021-12-23: 100 mL via INTRAVENOUS

## 2021-12-26 ENCOUNTER — Other Ambulatory Visit: Payer: Self-pay | Admitting: Family Medicine

## 2021-12-26 DIAGNOSIS — I728 Aneurysm of other specified arteries: Secondary | ICD-10-CM

## 2022-01-05 ENCOUNTER — Ambulatory Visit (INDEPENDENT_AMBULATORY_CARE_PROVIDER_SITE_OTHER): Payer: Medicare Other | Admitting: Vascular Surgery

## 2022-01-05 ENCOUNTER — Encounter: Payer: Self-pay | Admitting: Vascular Surgery

## 2022-01-05 VITALS — BP 144/89 | HR 65 | Temp 97.9°F | Resp 20 | Ht 63.0 in | Wt 146.0 lb

## 2022-01-05 DIAGNOSIS — I728 Aneurysm of other specified arteries: Secondary | ICD-10-CM

## 2022-01-05 NOTE — Progress Notes (Signed)
ASSESSMENT & PLAN   SPLENIC ARTERY ANEURYSM: This patient has a asymptomatic tube 0.2 x 1.7 cm splenic artery aneurysm near the hilum of the spleen.  I do not think that the symptoms that she has been having under her left anterior ribs can be attributed to this.  In talking to her it sounds like Dr. Linna Darner had mentioned this to her many years ago.  Regardless given that she is postmenopausal I think the risk of rupture is very small and therefore we will simply follow this for now.  I have ordered a CT angiogram of the abdomen in 1 year.  We would only consider addressing this if it became symptomatic or enlarge significantly.  She does know that if she develops sudden abdominal pain that she would need to go to the emergency room immediately for CT scan would determine if this was the source of her symptoms.  If it does enlarge she would likely need to be referred to interventional radiology for coil embolization of the aneurysm.  However currently the aneurysm is not especially large and she is likely had this for some time based on her history.  I will see her back in 1 year after her follow-up CT angiogram.  REASON FOR CONSULT:    Splenic artery aneurysm.  The consult is requested by Dr. Carollee Herter.   HPI:   Megan Murphy is a 66 y.o. female who has been having some pain under her anterior left ribs for about 2 years.  This pain occurs with prolonged standing.  Is relieved with Tylenol.  Does not occur when she is walking.  She denies any flank pain.  She does not remember specifically being told she had a splenic artery aneurysm.  However, she thinks perhaps Dr. Linna Darner had mentioned this in the past.  He told her that if she developed sudden pain in the left flank that she should get to the emergency department immediately to have this evaluated by CT scan.   Patient's risk factors for peripheral arterial disease include hypertension and hypercholesterolemia.  She denies any history of  diabetes, family history of premature cardiovascular disease, or tobacco use.  Past Medical History:  Diagnosis Date   Basal cell cancer     X 2;Dr Tonia Brooms   History of migraine headaches    Hyperlipidemia    Hypertension     Family History  Problem Relation Age of Onset   Cancer Mother        ovarian cancer   Stroke Sister 21   Colon polyps Sister    Hypertension Sister    Stroke Brother 19   Hypertension Brother        X 3   Cancer Maternal Grandmother        bladder cancer   Heart attack Brother 45   Diabetes Neg Hx    Colon cancer Neg Hx    Esophageal cancer Neg Hx    Stomach cancer Neg Hx    Rectal cancer Neg Hx     SOCIAL HISTORY: Social History   Tobacco Use   Smoking status: Never   Smokeless tobacco: Never  Substance Use Topics   Alcohol use: Yes    Comment:  rarely    Allergies  Allergen Reactions   Lisinopril     cough    Current Outpatient Medications  Medication Sig Dispense Refill   Calcium Carbonate-Vitamin D (CALTRATE 600+D PO) Take 1 tablet by mouth daily.     Cholecalciferol (  VITAMIN D3 PO) Take by mouth.     clobetasol ointment (TEMOVATE) 0.63 % Apply 1 application topically daily as needed (lichen sclerosus). 30 g 1   COLLAGEN PO Take 1 Dose by mouth daily. In powder form     losartan (COZAAR) 25 MG tablet Take 1 tablet (25 mg total) by mouth daily. Pt needs office visit for further refills 90 tablet 1   OVER THE COUNTER MEDICATION Take 1 Dose by mouth daily. Vitacup vitamin infused coffee     OVER THE COUNTER MEDICATION Baobab otc vitamin powder Mix 2 teaspoons with liquid once daily     Protein POWD Take 1 Dose by mouth daily.     verapamil (CALAN-SR) 180 MG CR tablet TAKE TWO TABLETS BY MOUTH DAILY AT BEDTIME 180 tablet 1   No current facility-administered medications for this visit.    REVIEW OF SYSTEMS:  '[X]'$  denotes positive finding, '[ ]'$  denotes negative finding Cardiac  Comments:  Chest pain or chest pressure:    Shortness of  breath upon exertion:    Short of breath when lying flat:    Irregular heart rhythm:        Vascular    Pain in calf, thigh, or hip brought on by ambulation:    Pain in feet at night that wakes you up from your sleep:     Blood clot in your veins:    Leg swelling:         Pulmonary    Oxygen at home:    Productive cough:     Wheezing:         Neurologic    Sudden weakness in arms or legs:     Sudden numbness in arms or legs:     Sudden onset of difficulty speaking or slurred speech:    Temporary loss of vision in one eye:     Problems with dizziness:         Gastrointestinal    Blood in stool:     Vomited blood:         Genitourinary    Burning when urinating:     Blood in urine:        Psychiatric    Major depression:         Hematologic    Bleeding problems:    Problems with blood clotting too easily:        Skin    Rashes or ulcers:        Constitutional    Fever or chills:    -  PHYSICAL EXAM:   Vitals:   01/05/22 1427  BP: (!) 144/89  Pulse: 65  Resp: 20  Temp: 97.9 F (36.6 C)  SpO2: 98%  Weight: 146 lb (66.2 kg)  Height: '5\' 3"'$  (1.6 m)   Body mass index is 25.86 kg/m. GENERAL: The patient is a well-nourished female, in no acute distress. The vital signs are documented above. CARDIAC: There is a regular rate and rhythm.  VASCULAR: I do not detect carotid bruits. She has palpable femoral, popliteal, and pedal pulses bilaterally. PULMONARY: There is good air exchange bilaterally without wheezing or rales. ABDOMEN: Soft and non-tender with normal pitched bowel sounds.  MUSCULOSKELETAL: There are no major deformities. NEUROLOGIC: No focal weakness or paresthesias are detected. SKIN: There are no ulcers or rashes noted. PSYCHIATRIC: The patient has a normal affect.  DATA:    CT ABDOMEN PELVIS: I have reviewed the CT abdomen pelvis that was done with contrast on 12/23/2021.  This was done to work-up left upper quadrant acute abdominal pain.  This  showed a 2.2 X 1.7 cm splenic artery aneurysm.  This is out of the hilum of the spleen.  Megan Murphy [facility]

## 2022-02-16 ENCOUNTER — Ambulatory Visit: Payer: Self-pay | Admitting: Surgery

## 2022-02-16 NOTE — H&P (Signed)
     Megan Murphy [id number]   Referring Provider:  Self   Subjective   Chief Complaint: Lipoma     History of Present Illness:     Very pleasant 66 year old woman who returns for follow-up regarding a lipoma of the left shoulder.  I saw her for this about 2 years ago and initially she had plan for surgery but did not end up following through with that.  She reports the mass has increased in size and she is now experiencing some tingling in her left dorsal forearm as well as fingertips which she is curious whether is related to the lipoma.  It is somewhat uncomfortable.  She is interested in proceeding with excision.   Review of Systems: A complete review of systems was obtained from the patient.  I have reviewed this information and discussed as appropriate with the patient.  See HPI as well for other ROS.   Medical History: Past Medical History:  Diagnosis Date   Glaucoma (increased eye pressure)    Hypertension     There is no problem list on file for this patient.   Past Surgical History:  Procedure Laterality Date   c sectin     CHOLECYSTECTOMY       Allergies  Allergen Reactions   Lisinopril Cough    cough    Current Outpatient Medications on File Prior to Visit  Medication Sig Dispense Refill   ascorbic acid/collagen hydr (COLLAGEN SKIN RENEWAL ORAL) Take by mouth     cholecalciferol (VITAMIN D3) 2,000 unit tablet Take 2,000 Units by mouth once daily     losartan (COZAAR) 25 MG tablet Take by mouth     omega-3 fatty acids (FISH OIL CONCENTRATE ORAL) Take by mouth     verapamiL (CALAN-SR) 180 MG SR tablet TAKE TWO TABLETS BY MOUTH DAILY AT BEDTIME     No current facility-administered medications on file prior to visit.    Family History  Problem Relation Age of Onset   Stroke Mother    Stroke Brother    High blood pressure (Hypertension) Brother      Social History   Tobacco Use  Smoking Status Never  Smokeless Tobacco Never     Social  History   Socioeconomic History   Marital status: Married  Tobacco Use   Smoking status: Never   Smokeless tobacco: Never    Objective:    Vitals:   02/16/22 1018  BP: 112/72  Pulse: 86  Weight: 67.2 kg (148 lb 3.2 oz)  Height: 160 cm (_0 )    Body mass index is 26.25 kg/m.  Alert and well-appearing Unlabored respirations Left anterior shoulder just medial to the deltoid is a smooth, mobile subcutaneous mass approximately 6 x 5 cm.  Nontender, no overlying skin changes.  Assessment and Plan:  Diagnoses and all orders for this visit:  Subcutaneous mass     Discussed excision under MAC.  Risks of bleeding, infection, pain, scarring, injury to underlying structures, chronic pain, recurrence, hematoma/seroma, wound healing problems.  Discussed with her that I am not convinced that this is going to affect the tingling in her forearm and fingers.  Questions welcomed and answered, we will Proceed with scheduling  Alejandro Gamel Raquel James, MD

## 2022-02-16 NOTE — H&P (View-Only) (Signed)
     Megan Murphy [id number]   Referring Provider:  Self   Subjective   Chief Complaint: Lipoma     History of Present Illness:     Very pleasant 66-year-old woman who returns for follow-up regarding a lipoma of the left shoulder.  I saw her for this about 2 years ago and initially she had plan for surgery but did not end up following through with that.  She reports the mass has increased in size and she is now experiencing some tingling in her left dorsal forearm as well as fingertips which she is curious whether is related to the lipoma.  It is somewhat uncomfortable.  She is interested in proceeding with excision.   Review of Systems: A complete review of systems was obtained from the patient.  I have reviewed this information and discussed as appropriate with the patient.  See HPI as well for other ROS.   Medical History: Past Medical History:  Diagnosis Date   Glaucoma (increased eye pressure)    Hypertension     There is no problem list on file for this patient.   Past Surgical History:  Procedure Laterality Date   c sectin     CHOLECYSTECTOMY       Allergies  Allergen Reactions   Lisinopril Cough    cough    Current Outpatient Medications on File Prior to Visit  Medication Sig Dispense Refill   ascorbic acid/collagen hydr (COLLAGEN SKIN RENEWAL ORAL) Take by mouth     cholecalciferol (VITAMIN D3) 2,000 unit tablet Take 2,000 Units by mouth once daily     losartan (COZAAR) 25 MG tablet Take by mouth     omega-3 fatty acids (FISH OIL CONCENTRATE ORAL) Take by mouth     verapamiL (CALAN-SR) 180 MG SR tablet TAKE TWO TABLETS BY MOUTH DAILY AT BEDTIME     No current facility-administered medications on file prior to visit.    Family History  Problem Relation Age of Onset   Stroke Mother    Stroke Brother    High blood pressure (Hypertension) Brother      Social History   Tobacco Use  Smoking Status Never  Smokeless Tobacco Never     Social  History   Socioeconomic History   Marital status: Married  Tobacco Use   Smoking status: Never   Smokeless tobacco: Never    Objective:    Vitals:   02/16/22 1018  BP: 112/72  Pulse: 86  Weight: 67.2 kg (148 lb 3.2 oz)  Height: 160 cm (5' 3")    Body mass index is 26.25 kg/m.  Alert and well-appearing Unlabored respirations Left anterior shoulder just medial to the deltoid is a smooth, mobile subcutaneous mass approximately 6 x 5 cm.  Nontender, no overlying skin changes.  Assessment and Plan:  Diagnoses and all orders for this visit:  Subcutaneous mass     Discussed excision under MAC.  Risks of bleeding, infection, pain, scarring, injury to underlying structures, chronic pain, recurrence, hematoma/seroma, wound healing problems.  Discussed with her that I am not convinced that this is going to affect the tingling in her forearm and fingers.  Questions welcomed and answered, we will Proceed with scheduling  Monish Haliburton AMANDA Haaris Metallo, MD    

## 2022-02-24 NOTE — Progress Notes (Signed)
COVID Vaccine received:  '[]'$  No '[x]'$  Yes  Date of any COVID positive Test in last 90 days:  PCP - Roma Schanz, DO Cardiologist -   Chest x-ray - 2001  Epic EKG -  12-20-21 EPIC Stress Test - none ECHO - 04-2019 Epic Cardiac Cath - None  Pacemaker/ICD device last checked:   '[x]'$  N/A Spinal Cord Stimulator:'[]'$  No '[]'$  Yes   Other Implants:   History of Sleep Apnea? '[]'$  No '[]'$  Yes  '[]'$  unknown Sleep Study Date:   CPAP used?- '[]'$  No '[]'$  Yes  (Instruct to bring their mask & Tubing)  Does the patient monitor blood sugar? '[]'$  No '[]'$  Yes  '[x]'$  N/A  Blood Thinner Instructions:  None Aspirin Instructions: Last Dose:  Activity level:  Can go up a flight of stairs and perform activities of daily living without stopping and       without symptoms of chest pain or shortness of breath.'[]'$  No '[]'$  Yes  '[]'$  N/A  Able to do exercises without symptoms '[]'$  No '[]'$  Yes  '[]'$  N/A  Patient able to complete ADLs without assistance '[]'$  No '[]'$  Yes  '[]'$  N/A    Comments:   Anesthesia review: HTN,   Patient denies shortness of breath, fever, cough and chest pain at PAT appointment  Patient verbalized understanding and agreement to the Pre-Surgical Instructions that were given to them at this PAT appointment. Patient was also educated of the need to review these PAT instructions again prior to his/her surgery.I reviewed the appropriate phone numbers to call if they have any and questions or concerns.

## 2022-02-24 NOTE — Patient Instructions (Signed)
DUE TO SPACE LIMITATIONS, ONLY TWO VISITORS  (aged 66 and older) ARE ALLOWED TO COME WITH YOU AND STAY IN THE WAITING ROOM DURING YOUR PRE OP AND PROCEDURE.   **NO VISITORS ARE ALLOWED IN THE SHORT STAY AREA OR RECOVERY ROOM!!**  You are not required to quarantine at this time prior to your surgery. However, you must do this: Hand Hygiene often Do NOT share personal items Notify your provider if you are in close contact with someone who has COVID or you develop fever 100.4 or greater, new onset of sneezing, cough, sore throat, shortness of breath or body aches.       Your procedure is scheduled on:  Friday March 03, 2022  Report to [facility] Main Entrance.  Report to admitting at:  05:15   AM  +++++Call this number if you have any questions or problems the morning of surgery [phone]  Do not eat food :After Midnight the night prior to your surgery/procedure.  After Midnight you may have the following liquids until  04:30 AM DAY OF SURGERY  Clear Liquid Diet Water Black Coffee (sugar ok, NO MILK/CREAM OR CREAMERS)  Tea (sugar ok, NO MILK/CREAM OR CREAMERS) regular and decaf                             Plain Jell-O (NO RED)                                           Fruit ices (not with fruit pulp, NO RED)                                     Popsicles (NO RED)                                                                  Juice: apple, WHITE grape, WHITE cranberry Sports drinks like Gatorade (NO RED) Clear broth(vegetable,chicken,beef)                FOLLOW ANY ADDITIONAL PRE OP INSTRUCTIONS YOU RECEIVED FROM YOUR SURGEON'S OFFICE!!!   Oral Hygiene is also important to reduce your risk of infection.        Remember - BRUSH YOUR TEETH THE MORNING OF SURGERY WITH YOUR REGULAR TOOTHPASTE  Take ONLY these medicines the morning of surgery with A SIP OF WATER:  Tylenol if needed.                   You may not have any metal on your body including hair pins, jewelry,  and body piercing  Do not wear make-up, lotions, powders, perfumes, or deodorant  Do not wear nail polish including gel and S&S, artificial / acrylic nails, or any other type of covering on natural nails including finger and toenails. If you have artificial nails, gel coating, etc., that needs to be removed by a nail salon, Please have this removed prior to surgery. Not doing so may mean that your surgery could be cancelled or delayed if the Surgeon or anesthesia  staff feels like they are unable to monitor you safely.   Do not shave 48 hours prior to surgery to avoid nicks in your skin which may contribute to postoperative infections.    DO NOT [place]. PHARMACY WILL DISPENSE MEDICATIONS LISTED ON YOUR MEDICATION LIST TO YOU DURING YOUR ADMISSION [place]!   Patients discharged on the day of surgery will not be allowed to drive home.  Someone NEEDS to stay with you for the first 24 hours after anesthesia.  Special Instructions: Bring a copy of your healthcare power of attorney and living will documents the day of surgery, if you wish to have them scanned into your [facility] Records- EPIC  Please read over the following fact sheets you were given: IF YOU HAVE QUESTIONS ABOUT YOUR PRE-OP INSTRUCTIONS, PLEASE CALL [phone]  ([place])   [place] - Preparing for Surgery Before surgery, you can play an important role.  Because skin is not sterile, your skin needs to be as free of germs as possible.  You can reduce the number of germs on your skin by washing with CHG (chlorahexidine gluconate) soap before surgery.  CHG is an antiseptic cleaner which kills germs and bonds with the skin to continue killing germs even after washing. Please DO NOT use if you have an allergy to CHG or antibacterial soaps.  If your skin becomes reddened/irritated stop using the CHG and inform your nurse when you arrive at Short Stay. Do not shave (including legs and  underarms) for at least 48 hours prior to the first CHG shower.  You may shave your face/neck.  Please follow these instructions carefully:  1.  Shower with CHG Soap the night before surgery and the  morning of surgery.  2.  If you choose to wash your hair, wash your hair first as usual with your normal  shampoo.  3.  After you shampoo, rinse your hair and body thoroughly to remove the shampoo.                             4.  Use CHG as you would any other liquid soap.  You can apply chg directly to the skin and wash.  Gently with a scrungie or clean washcloth.  5.  Apply the CHG Soap to your body ONLY FROM THE NECK DOWN.   Do not use on face/ open                           Wound or open sores. Avoid contact with eyes, ears mouth and genitals (private parts).                       Wash face,  Genitals (private parts) with your normal soap.             6.  Wash thoroughly, paying special attention to the area where your  surgery  will be performed.  7.  Thoroughly rinse your body with warm water from the neck down.  8.  DO NOT shower/wash with your normal soap after using and rinsing off the CHG Soap.            9.  Pat yourself dry with a clean towel.            10.  Wear clean pajamas.  11.  Place clean sheets on your bed the night of your first shower and do not  sleep with pets.  ON THE DAY OF SURGERY : Do not apply any lotions/deodorants the morning of surgery.  Please wear clean clothes to the hospital/surgery center.    FAILURE TO FOLLOW THESE INSTRUCTIONS MAY RESULT IN THE CANCELLATION OF YOUR SURGERY  PATIENT SIGNATURE_________________________________  NURSE SIGNATURE__________________________________  ________________________________________________________________________

## 2022-02-27 ENCOUNTER — Encounter (HOSPITAL_COMMUNITY)
Admission: RE | Admit: 2022-02-27 | Discharge: 2022-02-27 | Disposition: A | Discharge status: Home / Self Care | Payer: Medicare Other | Source: Ambulatory Visit | Attending: Surgery | Admitting: Surgery

## 2022-02-27 ENCOUNTER — Other Ambulatory Visit: Payer: Self-pay

## 2022-02-27 ENCOUNTER — Encounter (HOSPITAL_COMMUNITY): Payer: Self-pay

## 2022-02-27 VITALS — BP 130/83 | HR 57 | Temp 98.1°F | Resp 16 | Ht 63.0 in | Wt 147.0 lb

## 2022-02-27 DIAGNOSIS — I10 Essential (primary) hypertension: Secondary | ICD-10-CM | POA: Insufficient documentation

## 2022-02-27 DIAGNOSIS — Z01812 Encounter for preprocedural laboratory examination: Secondary | ICD-10-CM | POA: Diagnosis present

## 2022-02-27 HISTORY — DX: Other complications of anesthesia, initial encounter: T88.59XA

## 2022-02-27 LAB — BASIC METABOLIC PANEL
Anion gap: 6 (ref 5–15)
BUN: 17 mg/dL (ref 8–23)
CO2: 28 mmol/L (ref 22–32)
Calcium: 8.9 mg/dL (ref 8.9–10.3)
Chloride: 105 mmol/L (ref 98–111)
Creatinine, Ser: 0.65 mg/dL (ref 0.44–1.00)
GFR, Estimated: >60 mL/min (ref >60)
Glucose, Bld: 98 mg/dL (ref 70–99)
Potassium: 4.1 mmol/L (ref 3.5–5.1)
Sodium: 139 mmol/L (ref 135–145)

## 2022-03-02 NOTE — Anesthesia Preprocedure Evaluation (Addendum)
Anesthesia Evaluation  Patient identified by MRN, date of birth, ID band Patient awake    Reviewed: Allergy & Precautions, NPO status , Patient's Chart, lab work & pertinent test results  Airway Mallampati: II  TM Distance: >3 FB Neck ROM: Full    Dental no notable dental hx. (+) Teeth Intact, Dental Advisory Given   Pulmonary neg pulmonary ROS,    Pulmonary exam normal breath sounds clear to auscultation       Cardiovascular hypertension, Normal cardiovascular exam Rhythm:Regular Rate:Normal  TTE 2020 1. The left ventricle has normal systolic function with an ejection  fraction of 60-65%. The cavity size was normal. Left ventricular diastolic  parameters were normal.  2. The right ventricle has normal systolic function. The cavity was  normal. There is no increase in right ventricular wall thickness.  3. The aorta is normal unless otherwise noted.   Neuro/Psych  Headaches, negative psych ROS   GI/Hepatic negative GI ROS, Neg liver ROS,   Endo/Other  negative endocrine ROS  Renal/GU negative Renal ROS  negative genitourinary   Musculoskeletal negative musculoskeletal ROS (+)   Abdominal   Peds  Hematology negative hematology ROS (+)   Anesthesia Other Findings   Reproductive/Obstetrics                            Anesthesia Physical Anesthesia Plan  ASA: 2  Anesthesia Plan: MAC   Post-op Pain Management: Tylenol PO (pre-op)*   Induction: Intravenous  PONV Risk Score and Plan: 2 and Propofol infusion, Treatment may vary due to age or medical condition, Midazolam, Ondansetron and Dexamethasone  Airway Management Planned: Natural Airway  Additional Equipment:   Intra-op Plan:   Post-operative Plan:   Informed Consent: I have reviewed the patients History and Physical, chart, labs and discussed the procedure including the risks, benefits and alternatives for the proposed  anesthesia with the patient or authorized representative who has indicated his/her understanding and acceptance.     Dental advisory given  Plan Discussed with: CRNA  Anesthesia Plan Comments:         Anesthesia Quick Evaluation

## 2022-03-03 ENCOUNTER — Encounter (HOSPITAL_COMMUNITY): Payer: Self-pay | Admitting: Surgery

## 2022-03-03 ENCOUNTER — Ambulatory Visit (HOSPITAL_COMMUNITY): Payer: Medicare Other | Admitting: Anesthesiology

## 2022-03-03 ENCOUNTER — Other Ambulatory Visit: Payer: Self-pay

## 2022-03-03 ENCOUNTER — Ambulatory Visit (HOSPITAL_BASED_OUTPATIENT_CLINIC_OR_DEPARTMENT_OTHER): Payer: Medicare Other | Admitting: Anesthesiology

## 2022-03-03 ENCOUNTER — Ambulatory Visit (HOSPITAL_COMMUNITY)
Admission: RE | Admit: 2022-03-03 | Discharge: 2022-03-03 | Disposition: A | Discharge status: Home / Self Care | Payer: Medicare Other | Source: Ambulatory Visit | Attending: Surgery | Admitting: Surgery

## 2022-03-03 ENCOUNTER — Encounter (HOSPITAL_COMMUNITY)
Admission: RE | Disposition: A | Discharge status: Home / Self Care | Payer: Self-pay | Source: Ambulatory Visit | Attending: Surgery

## 2022-03-03 DIAGNOSIS — M7989 Other specified soft tissue disorders: Secondary | ICD-10-CM | POA: Diagnosis not present

## 2022-03-03 DIAGNOSIS — R202 Paresthesia of skin: Secondary | ICD-10-CM | POA: Diagnosis not present

## 2022-03-03 DIAGNOSIS — I10 Essential (primary) hypertension: Secondary | ICD-10-CM | POA: Diagnosis not present

## 2022-03-03 DIAGNOSIS — R2232 Localized swelling, mass and lump, left upper limb: Secondary | ICD-10-CM | POA: Insufficient documentation

## 2022-03-03 DIAGNOSIS — D1722 Benign lipomatous neoplasm of skin and subcutaneous tissue of left arm: Secondary | ICD-10-CM | POA: Diagnosis not present

## 2022-03-03 DIAGNOSIS — R222 Localized swelling, mass and lump, trunk: Secondary | ICD-10-CM | POA: Diagnosis present

## 2022-03-03 HISTORY — PX: EXCISION MASS UPPER EXTREMETIES: SHX6704

## 2022-03-03 SURGERY — EXCISION MASS UPPER EXTREMITIES
Anesthesia: Monitor Anesthesia Care | Site: Shoulder | Laterality: Left

## 2022-03-03 MED ORDER — PROPOFOL 10 MG/ML IV BOLUS
INTRAVENOUS | Status: AC
Start: 2022-03-03 — End: ?
  Filled 2022-03-03: qty 20

## 2022-03-03 MED ORDER — ONDANSETRON HCL 4 MG/2ML IJ SOLN
INTRAMUSCULAR | Status: DC | PRN
  Administered 2022-03-03: 4 mg via INTRAVENOUS

## 2022-03-03 MED ORDER — FENTANYL CITRATE PF 50 MCG/ML IJ SOSY: 25.0000 ug | PREFILLED_SYRINGE | INTRAMUSCULAR | Status: DC | PRN

## 2022-03-03 MED ORDER — DEXAMETHASONE SODIUM PHOSPHATE 10 MG/ML IJ SOLN
INTRAMUSCULAR | Status: DC | PRN
  Administered 2022-03-03: 6 mg via INTRAVENOUS

## 2022-03-03 MED ORDER — TRAMADOL HCL 50 MG PO TABS: 50.0000 mg | ORAL_TABLET | Freq: Four times a day (QID) | ORAL | 0 refills | Status: AC | PRN

## 2022-03-03 MED ORDER — ORAL CARE MOUTH RINSE: 15.0000 mL | Freq: Once | OROMUCOSAL | Status: AC

## 2022-03-03 MED ORDER — ONDANSETRON HCL 4 MG/2ML IJ SOLN
INTRAMUSCULAR | Status: AC
  Filled 2022-03-03: qty 2

## 2022-03-03 MED ORDER — LACTATED RINGERS IV SOLN: INTRAVENOUS | Status: DC

## 2022-03-03 MED ORDER — ACETAMINOPHEN 500 MG PO TABS
1000.0000 mg | ORAL_TABLET | Freq: Once | ORAL | Status: AC
  Administered 2022-03-03: 1000 mg via ORAL

## 2022-03-03 MED ORDER — 0.9 % SODIUM CHLORIDE (POUR BTL) OPTIME
TOPICAL | Status: DC | PRN
  Administered 2022-03-03: 1000 mL

## 2022-03-03 MED ORDER — SODIUM CHLORIDE 0.9 % IV SOLN: 250.0000 mL | INTRAVENOUS | Status: DC | PRN

## 2022-03-03 MED ORDER — FENTANYL CITRATE (PF) 100 MCG/2ML IJ SOLN
INTRAMUSCULAR | Status: DC | PRN
  Administered 2022-03-03 (×2): 50 ug via INTRAVENOUS

## 2022-03-03 MED ORDER — SODIUM CHLORIDE 0.9% FLUSH: 3.0000 mL | INTRAVENOUS | Status: DC | PRN

## 2022-03-03 MED ORDER — MIDAZOLAM HCL 2 MG/2ML IJ SOLN
INTRAMUSCULAR | Status: AC
Start: 2022-03-03 — End: ?
  Filled 2022-03-03: qty 2

## 2022-03-03 MED ORDER — PROPOFOL 1000 MG/100ML IV EMUL
INTRAVENOUS | Status: AC
Start: 2022-03-03 — End: ?
  Filled 2022-03-03: qty 100

## 2022-03-03 MED ORDER — DEXAMETHASONE SODIUM PHOSPHATE 10 MG/ML IJ SOLN
INTRAMUSCULAR | Status: AC
  Filled 2022-03-03: qty 1

## 2022-03-03 MED ORDER — CHLORHEXIDINE GLUCONATE 4 % EX LIQD: 60.0000 mL | Freq: Once | CUTANEOUS | Status: DC

## 2022-03-03 MED ORDER — ACETAMINOPHEN 325 MG PO TABS: 650.0000 mg | ORAL_TABLET | ORAL | Status: DC | PRN

## 2022-03-03 MED ORDER — SODIUM CHLORIDE 0.9% FLUSH: 3.0000 mL | Freq: Two times a day (BID) | INTRAVENOUS | Status: DC

## 2022-03-03 MED ORDER — CEFAZOLIN SODIUM-DEXTROSE 2-4 GM/100ML-% IV SOLN
2.0000 g | INTRAVENOUS | Status: AC
  Administered 2022-03-03: 2 g via INTRAVENOUS

## 2022-03-03 MED ORDER — CHLORHEXIDINE GLUCONATE 0.12 % MT SOLN
15.0000 mL | Freq: Once | OROMUCOSAL | Status: AC
  Administered 2022-03-03: 15 mL via OROMUCOSAL

## 2022-03-03 MED ORDER — ACETAMINOPHEN 650 MG RE SUPP: 650.0000 mg | RECTAL | Status: DC | PRN

## 2022-03-03 MED ORDER — OXYCODONE HCL 5 MG PO TABS: 5.0000 mg | ORAL_TABLET | ORAL | Status: DC | PRN

## 2022-03-03 MED ORDER — BUPIVACAINE-EPINEPHRINE 0.25% -1:200000 IJ SOLN
INTRAMUSCULAR | Status: DC | PRN
  Administered 2022-03-03: 13 mL

## 2022-03-03 MED ORDER — PROPOFOL 500 MG/50ML IV EMUL
INTRAVENOUS | Status: DC | PRN
  Administered 2022-03-03: 100 ug/kg/min via INTRAVENOUS

## 2022-03-03 MED ORDER — LIDOCAINE 2% (20 MG/ML) 5 ML SYRINGE
INTRAMUSCULAR | Status: DC | PRN
  Administered 2022-03-03: 100 mg via INTRAVENOUS

## 2022-03-03 MED ORDER — FENTANYL CITRATE (PF) 100 MCG/2ML IJ SOLN
INTRAMUSCULAR | Status: AC
  Filled 2022-03-03: qty 2

## 2022-03-03 MED ORDER — BUPIVACAINE-EPINEPHRINE (PF) 0.25% -1:200000 IJ SOLN
INTRAMUSCULAR | Status: AC
Start: 2022-03-03 — End: ?
  Filled 2022-03-03: qty 60

## 2022-03-03 MED ORDER — MIDAZOLAM HCL 5 MG/5ML IJ SOLN
INTRAMUSCULAR | Status: DC | PRN
  Administered 2022-03-03 (×2): 1 mg via INTRAVENOUS

## 2022-03-03 MED ORDER — LIDOCAINE HCL (PF) 2 % IJ SOLN
INTRAMUSCULAR | Status: AC
  Filled 2022-03-03: qty 5

## 2022-03-03 MED ORDER — PROPOFOL 10 MG/ML IV BOLUS
INTRAVENOUS | Status: DC | PRN
  Administered 2022-03-03 (×4): 20 mg via INTRAVENOUS

## 2022-03-03 SURGICAL SUPPLY — 29 items
BAG COUNTER SPONGE SURGICOUNT (BAG) IMPLANT
COVER SURGICAL LIGHT HANDLE (MISCELLANEOUS) ×2 IMPLANT
DERMABOND ADVANCED (GAUZE/BANDAGES/DRESSINGS)
DERMABOND ADVANCED .7 DNX12 (GAUZE/BANDAGES/DRESSINGS) IMPLANT
DRAPE LAPAROSCOPIC ABDOMINAL (DRAPES) IMPLANT
DRAPE LAPAROTOMY T 102X78X121 (DRAPES) IMPLANT
DRAPE LAPAROTOMY TRNSV 102X78 (DRAPES) IMPLANT
DRAPE UTILITY XL STRL (DRAPES) ×2 IMPLANT
ELECT REM PT RETURN 15FT ADLT (MISCELLANEOUS) ×2 IMPLANT
GAUZE SPONGE 4X4 12PLY STRL (GAUZE/BANDAGES/DRESSINGS) ×2 IMPLANT
GLOVE BIO SURGEON STRL SZ 6 (GLOVE) ×2 IMPLANT
GLOVE INDICATOR 6.5 STRL GRN (GLOVE) ×2 IMPLANT
GOWN STRL REUS W/ TWL LRG LVL3 (GOWN DISPOSABLE) ×1 IMPLANT
GOWN STRL REUS W/ TWL XL LVL3 (GOWN DISPOSABLE) IMPLANT
GOWN STRL REUS W/TWL LRG LVL3 (GOWN DISPOSABLE) ×2
GOWN STRL REUS W/TWL XL LVL3 (GOWN DISPOSABLE)
KIT BASIN OR (CUSTOM PROCEDURE TRAY) ×2 IMPLANT
KIT TURNOVER KIT A (KITS) IMPLANT
MARKER SKIN DUAL TIP RULER LAB (MISCELLANEOUS) IMPLANT
NEEDLE HYPO 22GX1.5 SAFETY (NEEDLE) ×2 IMPLANT
PACK GENERAL/GYN (CUSTOM PROCEDURE TRAY) ×2 IMPLANT
SPIKE FLUID TRANSFER (MISCELLANEOUS) IMPLANT
STAPLER VISISTAT 35W (STAPLE) IMPLANT
SUT MNCRL AB 4-0 PS2 18 (SUTURE) ×2 IMPLANT
SUT VIC AB 3-0 SH 27 (SUTURE) ×2
SUT VIC AB 3-0 SH 27XBRD (SUTURE) ×1 IMPLANT
SYR CONTROL 10ML LL (SYRINGE) ×2 IMPLANT
TOWEL OR 17X26 10 PK STRL BLUE (TOWEL DISPOSABLE) ×2 IMPLANT
TOWEL OR NON WOVEN STRL DISP B (DISPOSABLE) ×2 IMPLANT

## 2022-03-03 NOTE — Discharge Instructions (Signed)
GENERAL SURGERY: POST OP INSTRUCTIONS  EAT Gradually transition to a high fiber diet with a fiber supplement over the next few weeks after discharge.  Start with a pureed / full liquid diet (see below)  WALK Walk an hour a day (cumulative, not all at once).  Control your pain to do that.    CONTROL PAIN Control pain so that you can walk, sleep, tolerate sneezing/coughing, go up/down stairs.  HAVE A BOWEL MOVEMENT DAILY Keep your bowels regular to avoid problems.  OK to try a laxative to override constipation.  OK to use an antidairrheal to slow down diarrhea.  Call if not better after 2 tries  CALL IF YOU HAVE PROBLEMS/CONCERNS Call if you are still struggling despite following these instructions. Call if you have concerns not answered by these instructions    DIET: Follow a light bland diet & liquids the first 24 hours after arrival home, such as soup, liquids, starches, etc.  Be sure to drink plenty of fluids.  Quickly advance to a usual solid diet within a few days.  Avoid fast food or heavy meals as your are more likely to get nauseated or have irregular bowels.  A low-sugar, high-fiber diet for the rest of your life is ideal.   Take your usually prescribed home medications unless otherwise directed. PAIN CONTROL: Pain is best controlled by a usual combination of three different methods TOGETHER: Ice/Heat Over the counter pain medication Prescription pain medication Most patients will experience some swelling and bruising around the incisions.  Ice packs or heating pads (30-60 minutes up to 6 times a day) will help. Use ice for the first few days to help decrease swelling and bruising, then switch to heat to help relax tight/sore spots and speed recovery.  Some people prefer to use ice alone, heat alone, alternating between ice & heat.  Experiment to what works for you.  Swelling and bruising can take several weeks to resolve.   It is helpful to take an over-the-counter pain  medication regularly for the first few weeks.  Choose one of the following that works best for you: Naproxen (Aleve, etc)  Two 220mg  tabs twice a day OR Ibuprofen (Advil, etc) Three 200mg  tabs four times a day (every meal & bedtime) AND Acetaminophen (Tylenol, etc) 500-650mg  four times a day (every meal & bedtime) A  prescription for pain medication (such as oxycodone, hydrocodone, etc) should be given to you upon discharge.  Take your pain medication as prescribed.  If you are having problems/concerns with the prescription medicine (does not control pain, nausea, vomiting, rash, itching, etc), please call us [phone] to see if we need to switch you to a different pain medicine that will work better for you and/or control your side effect better. If you need a refill on your pain medication, please contact your pharmacy.  They will contact our office to request authorization. Prescriptions will not be filled after 5 pm or on week-ends. Avoid getting constipated.  Between the surgery and the pain medications, it is common to experience some constipation.  Increasing fluid intake and taking a fiber supplement (such as Metamucil, Citrucel, FiberCon, MiraLax, etc) 1-2 times a day regularly will usually help prevent this problem from occurring.  A mild laxative (prune juice, Milk of Magnesia, MiraLax, etc) should be taken according to package directions if there are no bowel movements after 48 hours.   Wash / shower every day, starting 2 days after surgery.  You may shower over the  steri strips as they are waterproof.  Continue to shower over incision(s) after the dressing is off. Remove your outer bandage 2 days after surgery; steri strips will peel off after 1-2 weeks.  You may leave the incision open to air.  You may have skin tapes (Steri Strips) covering the incision(s).  Leave them on until one week, then remove.  You may replace a dressing/Band-Aid to cover the incision for comfort if you wish.       ACTIVITIES as tolerated:   You may resume regular (light) daily activities beginning the next day--such as daily self-care, walking, climbing stairs--gradually increasing activities as tolerated.  If you can walk 30 minutes without difficulty, it is safe to try more intense activity such as jogging, treadmill, bicycling, low-impact aerobics, swimming, etc. Save the most intensive and strenuous activity for last such as sit-ups, heavy lifting, contact sports, etc  Refrain from any heavy lifting or straining until you are off narcotics for pain control.   DO NOT PUSH THROUGH PAIN.  Let pain be your guide: If it hurts to do something, don't do it.  Pain is your body warning you to avoid that activity for another week until the pain goes down. You may drive when you are no longer taking prescription pain medication, you can comfortably wear a seatbelt, and you can safely maneuver your car and apply brakes. You may have sexual intercourse when it is comfortable.  FOLLOW UP in our office Please call [facility] at [phone] to set up an appointment to see your surgeon in the office for a follow-up appointment approximately 2-3 weeks after your surgery. Make sure that you call for this appointment the day you arrive home to insure a convenient appointment time. 9. IF YOU HAVE DISABILITY OR FAMILY LEAVE FORMS, BRING THEM TO THE OFFICE FOR PROCESSING.  DO NOT GIVE THEM TO YOUR DOCTOR.   WHEN TO CALL us [phone]: Poor pain control Reactions / problems with new medications (rash/itching, nausea, etc)  Fever over 101.5 F (38.5 C) Worsening swelling or bruising Continued bleeding from incision. Increased pain, redness, or drainage from the incision Difficulty breathing / swallowing   The clinic staff is available to answer your questions during regular business hours (8:30am-5pm).  Please don't hesitate to call and ask to speak to one of our nurses for clinical concerns.   If you have a medical  emergency, go to the nearest emergency room or call 911.  A surgeon from [facility] is always on call at the [facility], [place], [place], [place],   [address] ? MAIN: [phone] ? TOLL FREE: [phone] ?  FAX [phone] [email or web address]

## 2022-03-03 NOTE — Interval H&P Note (Signed)
History and Physical Interval Note:  03/03/2022 7:06 AM  Megan Murphy  has presented today for surgery, with the diagnosis of SUBCUTANEOUS MASS.  The various methods of treatment have been discussed with the patient and family. After consideration of risks, benefits and other options for treatment, the patient has consented to  Procedure(s): EXCISION OF LEFT SHOULDER SUBCUTANEOUS MASS (Left) as a surgical intervention.  The patient's history has been reviewed, patient examined, no change in status, stable for surgery.  I have reviewed the patient's chart and labs.  Questions were answered to the patient's satisfaction.     Dionis Autry Rich Brave

## 2022-03-03 NOTE — Transfer of Care (Signed)
Immediate Anesthesia Transfer of Care Note  Patient: Megan Murphy  Procedure(s) Performed: EXCISION OF LEFT SHOULDER SUBCUTANEOUS MASS (Left: Shoulder)  Patient Location: PACU  Anesthesia Type:MAC  Level of Consciousness: awake and alert   Airway & Oxygen Therapy: Patient Spontanous Breathing and Patient connected to face mask oxygen  Post-op Assessment: Report given to RN and Post -op Vital signs reviewed and stable  Post vital signs: Reviewed and stable  Last Vitals:  Vitals Value Taken Time  BP 109/63 03/03/22 0809  Temp    Pulse 65 03/03/22 0810  Resp 17 03/03/22 0810  SpO2 99 % 03/03/22 0810  Vitals shown include unvalidated device data.  Last Pain:  Vitals:   03/03/22 0536  TempSrc: Oral         Complications: No notable events documented.

## 2022-03-03 NOTE — Op Note (Signed)
Operative Note  Megan Murphy  [id number]  [id number]  03/03/2022   Surgeon: Romana Juniper MD FACS    Procedure performed: Excision of left shoulder intramuscular mass, 5 x 4 x 3 cm   Preop diagnosis: Left shoulder mass Post-op diagnosis/intraop findings: Intramuscular left shoulder mass   Specimens: Left shoulder mass Retained items: no  EBL: 0cc Complications: none   Description of procedure: After obtaining informed consent the patient was taken to the operating room and placed supine on operating room table where MAC was initiated, preoperative antibiotics were administered, SCDs applied, and a formal timeout was performed.  The left shoulder was prepped and draped in usual sterile fashion.  After infiltration with local, an incision was made along the Langer's lines and the soft tissue dissected with cautery.  The mass was noted to be completely intramuscular within the anterior deltoid.  The muscle fibers were carefully split and continued blunt dissection ensued until the mass was able to be delivered in the field.  This was dissected down to the pedicle which was then ligated with a 3-0 Vicryl tie.  The mass was removed intact and handed off for pathology.  Hemostasis was ensured within the wound.  The incision was closed with interrupted deep dermal 3-0 Vicryl followed by running subcuticular 4 Monocryl.  Benzoin, Steri-Strips and a light pressure dressing were applied. The patient was then awakened, extubated and taken to PACU in stable condition.    All counts were correct at the completion of the case.

## 2022-03-03 NOTE — Anesthesia Procedure Notes (Signed)
Date/Time: 03/03/2022 7:26 AM  Performed by: Sharlette Dense, CRNAOxygen Delivery Method: Simple face mask

## 2022-03-03 NOTE — Anesthesia Postprocedure Evaluation (Signed)
Anesthesia Post Note  Patient: Megan Murphy  Procedure(s) Performed: EXCISION OF LEFT SHOULDER SUBCUTANEOUS MASS (Left: Shoulder)     Patient location during evaluation: PACU Anesthesia Type: MAC Level of consciousness: awake and alert Pain management: pain level controlled Vital Signs Assessment: post-procedure vital signs reviewed and stable Respiratory status: spontaneous breathing, nonlabored ventilation, respiratory function stable and patient connected to nasal cannula oxygen Cardiovascular status: stable and blood pressure returned to baseline Postop Assessment: no apparent nausea or vomiting Anesthetic complications: no   No notable events documented.  Last Vitals:  Vitals:   03/03/22 0830 03/03/22 0839  BP: 128/75 134/86  Pulse: (!) 57 (!) 57  Resp: 15   Temp:    SpO2: 92% 99%    Last Pain:  Vitals:   03/03/22 0830  TempSrc:   PainSc: 0-No pain                 Phillis Thackeray L Nashaun Hillmer

## 2022-03-04 ENCOUNTER — Encounter (HOSPITAL_COMMUNITY): Payer: Self-pay | Admitting: Surgery

## 2022-03-06 LAB — SURGICAL PATHOLOGY

## 2022-03-29 ENCOUNTER — Encounter (INDEPENDENT_AMBULATORY_CARE_PROVIDER_SITE_OTHER): Payer: Self-pay

## 2022-06-10 ENCOUNTER — Other Ambulatory Visit: Payer: Self-pay | Admitting: Family Medicine

## 2022-06-10 DIAGNOSIS — I10 Essential (primary) hypertension: Secondary | ICD-10-CM

## 2022-07-27 ENCOUNTER — Telehealth: Payer: Self-pay

## 2022-07-27 NOTE — Telephone Encounter (Signed)
[facility] - Client Client Site [facility] - Day Provider [facility] - PHYSICIAN, Verita Schneiders- MD Contact Type Call Who Is Calling Patient / Member / Family / Caregiver Caller Name Poole Phone Number [phone] Patient Name Megan Murphy Patient DOB 07-14-1956 Call Type Message Only Information Provided Reason for Call Request for General Office Information Initial Comment Caller states she has two prescriptions she needs refills on. No symptoms. Additional Comment Office hours provided. Triage declined. Disp. Time Disposition Final Use

## 2022-07-28 ENCOUNTER — Other Ambulatory Visit: Payer: Self-pay

## 2022-07-28 DIAGNOSIS — I10 Essential (primary) hypertension: Secondary | ICD-10-CM

## 2022-07-28 MED ORDER — VERAPAMIL HCL ER 180 MG PO TBCR: EXTENDED_RELEASE_TABLET | ORAL | 0 refills | Status: DC

## 2022-07-28 NOTE — Telephone Encounter (Signed)
Spoke w/ Pt- she needed a refill on verapamil sent to [facility]. Rx sent. Appt scheduled for Monday, 12/11 for 6 month f/u.

## 2022-07-31 ENCOUNTER — Ambulatory Visit (INDEPENDENT_AMBULATORY_CARE_PROVIDER_SITE_OTHER): Payer: Medicare Other | Admitting: Family Medicine

## 2022-07-31 ENCOUNTER — Encounter: Payer: Self-pay | Admitting: Family Medicine

## 2022-07-31 VITALS — BP 126/80 | HR 82 | Temp 97.9°F | Resp 12 | Ht 63.0 in | Wt 153.8 lb

## 2022-07-31 DIAGNOSIS — Z23 Encounter for immunization: Secondary | ICD-10-CM

## 2022-07-31 DIAGNOSIS — R202 Paresthesia of skin: Secondary | ICD-10-CM | POA: Diagnosis not present

## 2022-07-31 DIAGNOSIS — I10 Essential (primary) hypertension: Secondary | ICD-10-CM

## 2022-07-31 DIAGNOSIS — R200 Anesthesia of skin: Secondary | ICD-10-CM

## 2022-07-31 MED ORDER — LOSARTAN POTASSIUM 25 MG PO TABS: ORAL_TABLET | ORAL | 1 refills | Status: DC

## 2022-07-31 MED ORDER — VERAPAMIL HCL ER 180 MG PO TBCR: EXTENDED_RELEASE_TABLET | ORAL | 1 refills | Status: DC

## 2022-07-31 NOTE — Assessment & Plan Note (Signed)
Refer to hand specialist

## 2022-07-31 NOTE — Assessment & Plan Note (Signed)
Well controlled, no changes to meds. Encouraged heart healthy diet such as the DASH diet and exercise as tolerated.  °

## 2022-07-31 NOTE — Progress Notes (Signed)
   Established Patient Office Visit  Subjective   Patient ID: Megan Murphy, female    DOB: 12-20-1955  Age: 66 y.o. MRN: [id number]  Chief Complaint  Patient presents with   Follow-up    HPI    ROS    Objective:     BP 126/80 (BP Location: Right Arm, Cuff Size: Normal)   Pulse 82   Temp 97.9 F (36.6 C) (Oral)   Resp 12   Ht '5\' 3"'$  (1.6 m)   Wt 153 lb 12.8 oz (69.8 kg)   SpO2 96%   BMI 27.24 kg/m    Physical Exam Vitals and nursing note reviewed.  Constitutional:      Appearance: She is well-developed.  HENT:     Head: Normocephalic and atraumatic.  Eyes:     Conjunctiva/sclera: Conjunctivae normal.  Neck:     Thyroid: No thyromegaly.     Vascular: No carotid bruit or JVD.  Cardiovascular:     Rate and Rhythm: Normal rate and regular rhythm.     Heart sounds: Normal heart sounds. No murmur heard. Pulmonary:     Effort: Pulmonary effort is normal. No respiratory distress.     Breath sounds: Normal breath sounds. No wheezing or rales.  Chest:     Chest wall: No tenderness.  Musculoskeletal:        General: No swelling, tenderness, deformity or signs of injury. Normal range of motion.     Cervical back: Normal range of motion and neck supple.  Neurological:     Mental Status: She is alert and oriented to person, place, and time.     Sensory: Sensory deficit present.     Motor: No weakness.     Coordination: Coordination abnormal.     Deep Tendon Reflexes: Reflexes abnormal.     Comments: Numbness / tingling L elbow / forearm to hand ---  3, 4, 5 fingers   Psychiatric:        Mood and Affect: Mood normal.        Behavior: Behavior normal.     No results found for any visits on 07/31/22.    The 10-year ASCVD risk score (Arnett DK, et al., 2019) is: 7.6%    Assessment & Plan:   Problem List Items Addressed This Visit       Unprioritized   Numbness and tingling in left hand   Relevant Orders   Ambulatory referral to Orthopedic Surgery    Need for tetanus booster   Relevant Orders   Tdap vaccine greater than or equal to 7yo IM   Essential hypertension - Primary    Well controlled, no changes to meds. Encouraged heart healthy diet such as the DASH diet and exercise as tolerated.        Relevant Medications   losartan (COZAAR) 25 MG tablet   verapamil (CALAN-SR) 180 MG CR tablet   Other Relevant Orders   Comprehensive metabolic panel   Lipid panel   Other Visit Diagnoses     Primary hypertension       Relevant Medications   losartan (COZAAR) 25 MG tablet   verapamil (CALAN-SR) 180 MG CR tablet       No follow-ups on file.    Ann Held, DO         ++

## 2022-07-31 NOTE — Progress Notes (Signed)
Subjective:   By signing my name below, I, Megan Murphy, attest that this documentation has been prepared under the direction and in the presence of Megan Held DO 07/31/2022   Patient ID: Megan Murphy, female    DOB: 1955-12-02, 66 y.o.   MRN: [id number]  Chief Complaint  Patient presents with   Follow-up    HPI Patient is in today for an office visit  She is requesting a refill of 25 mg of Losartan and 180 mg of Verapamil.   As of today's visit, her blood pressure is well. She is currently taking two tablets of 180 mg of Verapamil.  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   She is interested in receiving a tetanus vaccine during today's visit. She is not UTD on the RSV or Covid vaccines. She is UTD on the Influenza vaccine.   She states that the numbness in her left lower arm is persistent. When she is holding something, she has to put the item down because of weakness. She notes that in the late 1980's, she injured the area causing hematoma.   She reports that she has black lines in her left vision. She was seen by a retinal specialist and has a follow up with them in 5 months.   Past Medical History:  Diagnosis Date   Basal cell cancer     X 2;Dr Tonia Brooms   Complication of anesthesia    longer to come out of anesthesia   History of migraine headaches    Hyperlipidemia    Hypertension     Past Surgical History:  Procedure Laterality Date   BASAL CELL CARCINOMA EXCISION      X 2   CESAREAN SECTION      X 2; G 2 P 2   CHOLECYSTECTOMY N/A 01/09/2018   Procedure: LAPAROSCOPIC CHOLECYSTECTOMY;  Surgeon: Clovis Riley, MD;  Location: [facility];  Service: General;  Laterality: N/A;   COLONOSCOPY  2012   Dr Olevia Murphy,  negative   dilation and curretage     Dr Dellis Filbert   EXCISION MASS UPPER EXTREMETIES Left 03/03/2022   Procedure: EXCISION OF LEFT SHOULDER SUBCUTANEOUS MASS;  Surgeon: Clovis Riley, MD;  Location: [facility];  Service: General;  Laterality: Left;   WISDOM TOOTH EXTRACTION     Family History  Problem Relation Age of Onset   Cancer Mother        ovarian cancer   Stroke Sister 51   Colon polyps Sister    Hypertension Sister    Stroke Brother 31   Hypertension Brother        X 3   Cancer Maternal Grandmother        bladder cancer   Heart attack Brother 41   Diabetes Neg Hx    Colon cancer Neg Hx    Esophageal cancer Neg Hx    Stomach cancer Neg Hx    Rectal cancer Neg Hx     Social History   Socioeconomic History   Marital status: Married    Spouse name: Not on file   Number of children: Not on file   Years of education: Not on file   Highest education level: Not on file  Occupational History   Occupation: Psychologist, occupational: [employer]    Comment: retired  Tobacco Use   Smoking status: Never   Smokeless tobacco: Never  Vaping Use   Vaping Use: Never used  Substance and Sexual Activity   Alcohol use: Yes    Comment:  rarely   Drug use: No   Sexual activity: Yes  Other Topics Concern   Not on file  Social History Narrative   Exercise-- walking some days    Social Determinants of Health   Financial Resource Strain: Not on file  Food Insecurity: Not on file  Transportation Needs: Not on file  Physical Activity: Not on file  Stress: Not on file  Social Connections: Not on file  Intimate Partner Violence: Not on file    Outpatient Medications Prior to Visit  Medication Sig Dispense Refill   acetaminophen (TYLENOL) 500 MG tablet Take 1,000 mg by mouth every 8 (eight) hours as needed for headache or moderate pain.     Calcium Carb-Cholecalciferol (CALCIUM 500/VITAMIN D PO) Take 1 tablet by mouth daily.     Cholecalciferol (VITAMIN D3) 50 MCG (2000  UT) capsule Take 2,000 Units by mouth daily.     clobetasol ointment (TEMOVATE) 3.15 % Apply 1 application topically daily as needed (lichen sclerosus). 30 g 1   COLLAGEN PO Take 1 Scoop by mouth daily. In powder form     Omega-3 Fatty Acids (FISH OIL ULTRA) 1400 MG CAPS Take 1,400 mg by mouth daily.     OVER THE COUNTER MEDICATION Take 1 Dose by mouth daily. Vitacup vitamin infused coffee     OVER THE COUNTER MEDICATION Take 2 Scoops by mouth See admin instructions. Baobab otc vitamin powder Mix 2 teaspoons with liquid once daily during WINTER months ONLY     losartan (COZAAR) 25 MG tablet TAKE ONE TABLET BY MOUTH ONE TIME DAILY (PT NEEDS OFFICE VISIT FOR FURTHER REFILLS) 90 tablet 0   verapamil (CALAN-SR) 180 MG CR tablet TAKE TWO TABLETS BY MOUTH DAILY AT BEDTIME 60 tablet 0   No facility-administered medications prior to visit.    Allergies  Allergen Reactions   Lisinopril     cough    Review of Systems  Neurological:  Positive for weakness (Left Lower Arm).       Objective:    Physical Exam Constitutional:      General: She is not in acute distress.    Appearance: Normal appearance. She is not ill-appearing.  HENT:     Head: Normocephalic and atraumatic.     Right Ear: External ear normal.     Left Ear: External ear normal.  Eyes:     Extraocular Movements: Extraocular movements intact.     Pupils: Pupils are equal, round, and reactive to light.  Cardiovascular:     Rate and Rhythm: Normal rate and regular rhythm.     Heart sounds: Normal heart sounds. No murmur heard.    No gallop.  Pulmonary:     Effort: Pulmonary effort is normal. No respiratory distress.     Breath sounds: Normal breath sounds. No wheezing or rales.  Skin:    General: Skin is warm and dry.  Neurological:     Mental Status: She is alert and oriented to person, place, and time.  Psychiatric:        Judgment: Judgment normal.     BP 126/80 (BP Location: Right Arm, Cuff Size: Normal)   Pulse  82  Temp 97.9 F (36.6 C) (Oral)   Resp 12   Ht '5\' 3"'$  (1.6 m)   Wt 153 lb 12.8 oz (69.8 kg)   SpO2 96%   BMI 27.24 kg/m  Wt Readings from Last 3 Encounters:  07/31/22 153 lb 12.8 oz (69.8 kg)  02/27/22 147 lb (66.7 kg)  01/05/22 146 lb (66.2 kg)    Diabetic Foot Exam - Simple   No data filed    Lab Results  Component Value Date   WBC 4.7 12/20/2021   HGB 14.1 12/20/2021   HCT 42.2 12/20/2021   PLT 265.0 12/20/2021   GLUCOSE 98 02/27/2022   CHOL 271 (H) 12/20/2021   TRIG 99.0 12/20/2021   HDL 94.70 12/20/2021   LDLDIRECT 134.3 03/13/2012   LDLCALC 157 (H) 12/20/2021   ALT 14 12/20/2021   AST 14 12/20/2021   NA 139 02/27/2022   K 4.1 02/27/2022   CL 105 02/27/2022   CREATININE 0.65 02/27/2022   BUN 17 02/27/2022   CO2 28 02/27/2022   TSH 1.93 11/09/2020   HGBA1C 5.5 03/07/2011    Lab Results  Component Value Date   TSH 1.93 11/09/2020   Lab Results  Component Value Date   WBC 4.7 12/20/2021   HGB 14.1 12/20/2021   HCT 42.2 12/20/2021   MCV 96.6 12/20/2021   PLT 265.0 12/20/2021   Lab Results  Component Value Date   NA 139 02/27/2022   K 4.1 02/27/2022   CO2 28 02/27/2022   GLUCOSE 98 02/27/2022   BUN 17 02/27/2022   CREATININE 0.65 02/27/2022   BILITOT 0.4 12/20/2021   ALKPHOS 95 12/20/2021   AST 14 12/20/2021   ALT 14 12/20/2021   PROT 6.7 12/20/2021   ALBUMIN 4.0 12/20/2021   CALCIUM 8.9 02/27/2022   ANIONGAP 6 02/27/2022   GFR 91.11 12/20/2021   Lab Results  Component Value Date   CHOL 271 (H) 12/20/2021   Lab Results  Component Value Date   HDL 94.70 12/20/2021   Lab Results  Component Value Date   LDLCALC 157 (H) 12/20/2021   Lab Results  Component Value Date   TRIG 99.0 12/20/2021   Lab Results  Component Value Date   CHOLHDL 3 12/20/2021   Lab Results  Component Value Date   HGBA1C 5.5 03/07/2011       Assessment & Plan:   Problem List Items Addressed This Visit       Unprioritized   Numbness and tingling  in left hand    Refer to hand specialist      Relevant Orders   Ambulatory referral to Orthopedic Surgery   Need for tetanus booster   Essential hypertension - Primary    Well controlled, no changes to meds. Encouraged heart healthy diet such as the DASH diet and exercise as tolerated.        Relevant Medications   losartan (COZAAR) 25 MG tablet   verapamil (CALAN-SR) 180 MG CR tablet   Other Relevant Orders   Comprehensive metabolic panel   Lipid panel   Other Visit Diagnoses     Primary hypertension       Relevant Medications   losartan (COZAAR) 25 MG tablet   verapamil (CALAN-SR) 180 MG CR tablet      Meds ordered this encounter  Medications   losartan (COZAAR) 25 MG tablet    Sig: TAKE ONE TABLET BY MOUTH ONE TIME DAILY    Dispense:  90 tablet    Refill:  1  verapamil (CALAN-SR) 180 MG CR tablet    Sig: TAKE TWO TABLETS BY MOUTH DAILY AT BEDTIME    Dispense:  180 tablet    Refill:  1    I, Megan Held, DO, personally preformed the services described in this documentation.  All medical record entries made by the scribe were at my direction and in my presence.  I have reviewed the chart and discharge instructions (if applicable) and agree that the record reflects my personal performance and is accurate and complete. 07/31/2022   I,Amber Collins,acting as a scribe for Megan Held, DO.,have documented all relevant documentation on the behalf of Megan Held, DO,as directed by  Megan Held, DO while in the presence of Megan Held, DO.    Megan Held, DO

## 2022-08-01 LAB — LIPID PANEL
Cholesterol: 274 mg/dL — ABNORMAL HIGH (ref 0–200)
HDL: 90.1 mg/dL (ref >39.00)
LDL Cholesterol: 147 mg/dL — ABNORMAL HIGH (ref 0–99)
NonHDL: 183.79
Total CHOL/HDL Ratio: 3
Triglycerides: 183 mg/dL — ABNORMAL HIGH (ref 0.0–149.0)
VLDL: 36.6 mg/dL (ref 0.0–40.0)

## 2022-08-01 LAB — COMPREHENSIVE METABOLIC PANEL
ALT: 12 U/L (ref 0–35)
AST: 14 U/L (ref 0–37)
Albumin: 4 g/dL (ref 3.5–5.2)
Alkaline Phosphatase: 88 U/L (ref 39–117)
BUN: 22 mg/dL (ref 6–23)
CO2: 29 mEq/L (ref 19–32)
Calcium: 9.1 mg/dL (ref 8.4–10.5)
Chloride: 102 mEq/L (ref 96–112)
Creatinine, Ser: 0.8 mg/dL (ref 0.40–1.20)
GFR: 76.75 mL/min (ref >60.00)
Glucose, Bld: 78 mg/dL (ref 70–99)
Potassium: 3.8 mEq/L (ref 3.5–5.1)
Sodium: 139 mEq/L (ref 135–145)
Total Bilirubin: 0.3 mg/dL (ref 0.2–1.2)
Total Protein: 6.6 g/dL (ref 6.0–8.3)

## 2022-08-02 ENCOUNTER — Ambulatory Visit (INDEPENDENT_AMBULATORY_CARE_PROVIDER_SITE_OTHER): Payer: Medicare Other | Admitting: *Deleted

## 2022-08-02 DIAGNOSIS — Z0000 Encounter for general adult medical examination without abnormal findings: Secondary | ICD-10-CM

## 2022-08-02 NOTE — Patient Instructions (Signed)
Megan Murphy , Thank you for taking time to come for your Medicare Wellness Visit. I appreciate your ongoing commitment to your health goals. Please review the following plan we discussed and let me know if I can assist you in the future.   These are the goals we discussed:  Goals   None     This is a list of the screening recommended for you and due dates:  Health Maintenance  Topic Date Due   Zoster (Shingles) Vaccine (1 of 2) Never done   Mammogram  12/30/2020   DEXA scan (bone density measurement)  Never done   DTaP/Tdap/Td vaccine (2 - Td or Tdap) 01/11/2022   COVID-19 Vaccine (3 - Pfizer risk series) 08/16/2022*   Medicare Annual Wellness Visit  08/03/2023   Colon Cancer Screening  10/30/2027   Pneumonia Vaccine  Completed   Flu Shot  Completed   Hepatitis C Screening: USPSTF Recommendation to screen - Ages 18-79 yo.  Completed   HPV Vaccine  Aged Out  *Topic was postponed. The date shown is not the original due date.     Next appointment: Follow up in one year for your annual wellness visit.   Preventive Care 33 Years and Older, Female Preventive care refers to lifestyle choices and visits with your health care provider that can promote health and wellness. What does preventive care include? A yearly physical exam. This is also called an annual well check. Dental exams once or twice a year. Routine eye exams. Ask your health care provider how often you should have your eyes checked. Personal lifestyle choices, including: Daily care of your teeth and gums. Regular physical activity. Eating a healthy diet. Avoiding tobacco and drug use. Limiting alcohol use. Practicing safe sex. Taking low-dose aspirin every day. Taking vitamin and mineral supplements as recommended by your health care provider. What happens during an annual well check? The services and screenings done by your health care provider during your annual well check will depend on your age, overall  health, lifestyle risk factors, and family history of disease. Counseling  Your health care provider may ask you questions about your: Alcohol use. Tobacco use. Drug use. Emotional well-being. Home and relationship well-being. Sexual activity. Eating habits. History of falls. Memory and ability to understand (cognition). Work and work Statistician. Reproductive health. Screening  You may have the following tests or measurements: Height, weight, and BMI. Blood pressure. Lipid and cholesterol levels. These may be checked every 5 years, or more frequently if you are over 55 years old. Skin check. Lung cancer screening. You may have this screening every year starting at age 62 if you have a 30-pack-year history of smoking and currently smoke or have quit within the past 15 years. Fecal occult blood test (FOBT) of the stool. You may have this test every year starting at age 16. Flexible sigmoidoscopy or colonoscopy. You may have a sigmoidoscopy every 5 years or a colonoscopy every 10 years starting at age 46. Hepatitis C blood test. Hepatitis B blood test. Sexually transmitted disease (STD) testing. Diabetes screening. This is done by checking your blood sugar (glucose) after you have not eaten for a while (fasting). You may have this done every 1-3 years. Bone density scan. This is done to screen for osteoporosis. You may have this done starting at age 88. Mammogram. This may be done every 1-2 years. Talk to your health care provider about how often you should have regular mammograms. Talk with your health care provider about  your test results, treatment options, and if necessary, the need for more tests. Vaccines  Your health care provider may recommend certain vaccines, such as: Influenza vaccine. This is recommended every year. Tetanus, diphtheria, and acellular pertussis (Tdap, Td) vaccine. You may need a Td booster every 10 years. Zoster vaccine. You may need this after age  31. Pneumococcal 13-valent conjugate (PCV13) vaccine. One dose is recommended after age 45. Pneumococcal polysaccharide (PPSV23) vaccine. One dose is recommended after age 35. Talk to your health care provider about which screenings and vaccines you need and how often you need them. This information is not intended to replace advice given to you by your health care provider. Make sure you discuss any questions you have with your health care provider. Document Released: 09/03/2015 Document Revised: 04/26/2016 Document Reviewed: 06/08/2015 Elsevier Interactive Patient Education  2017 [place] Prevention in the Home Falls can cause injuries. They can happen to people of all ages. There are many things you can do to make your home safe and to help prevent falls. What can I do on the outside of my home? Regularly fix the edges of walkways and driveways and fix any cracks. Remove anything that might make you trip as you walk through a door, such as a raised step or threshold. Trim any bushes or trees on the path to your home. Use bright outdoor lighting. Clear any walking paths of anything that might make someone trip, such as rocks or tools. Regularly check to see if handrails are loose or broken. Make sure that both sides of any steps have handrails. Any raised decks and porches should have guardrails on the edges. Have any leaves, snow, or ice cleared regularly. Use sand or salt on walking paths during winter. Clean up any spills in your garage right away. This includes oil or grease spills. What can I do in the bathroom? Use night lights. Install grab bars by the toilet and in the tub and shower. Do not use towel bars as grab bars. Use non-skid mats or decals in the tub or shower. If you need to sit down in the shower, use a plastic, non-slip stool. Keep the floor dry. Clean up any water that spills on the floor as soon as it happens. Remove soap buildup in the tub or shower  regularly. Attach bath mats securely with double-sided non-slip rug tape. Do not have throw rugs and other things on the floor that can make you trip. What can I do in the bedroom? Use night lights. Make sure that you have a light by your bed that is easy to reach. Do not use any sheets or blankets that are too big for your bed. They should not hang down onto the floor. Have a firm chair that has side arms. You can use this for support while you get dressed. Do not have throw rugs and other things on the floor that can make you trip. What can I do in the kitchen? Clean up any spills right away. Avoid walking on wet floors. Keep items that you use a lot in easy-to-reach places. If you need to reach something above you, use a strong step stool that has a grab bar. Keep electrical cords out of the way. Do not use floor polish or wax that makes floors slippery. If you must use wax, use non-skid floor wax. Do not have throw rugs and other things on the floor that can make you trip. What can I do with  my stairs? Do not leave any items on the stairs. Make sure that there are handrails on both sides of the stairs and use them. Fix handrails that are broken or loose. Make sure that handrails are as long as the stairways. Check any carpeting to make sure that it is firmly attached to the stairs. Fix any carpet that is loose or worn. Avoid having throw rugs at the top or bottom of the stairs. If you do have throw rugs, attach them to the floor with carpet tape. Make sure that you have a light switch at the top of the stairs and the bottom of the stairs. If you do not have them, ask someone to add them for you. What else can I do to help prevent falls? Wear shoes that: Do not have high heels. Have rubber bottoms. Are comfortable and fit you well. Are closed at the toe. Do not wear sandals. If you use a stepladder: Make sure that it is fully opened. Do not climb a closed stepladder. Make sure that  both sides of the stepladder are locked into place. Ask someone to hold it for you, if possible. Clearly mark and make sure that you can see: Any grab bars or handrails. First and last steps. Where the edge of each step is. Use tools that help you move around (mobility aids) if they are needed. These include: Canes. Walkers. Scooters. Crutches. Turn on the lights when you go into a dark area. Replace any light bulbs as soon as they burn out. Set up your furniture so you have a clear path. Avoid moving your furniture around. If any of your floors are uneven, fix them. If there are any pets around you, be aware of where they are. Review your medicines with your doctor. Some medicines can make you feel dizzy. This can increase your chance of falling. Ask your doctor what other things that you can do to help prevent falls. This information is not intended to replace advice given to you by your health care provider. Make sure you discuss any questions you have with your health care provider. Document Released: 06/03/2009 Document Revised: 01/13/2016 Document Reviewed: 09/11/2014 Elsevier Interactive Patient Education  2017 [employer].

## 2022-08-02 NOTE — Progress Notes (Signed)
Subjective:   Megan Murphy is a 66 y.o. female who presents for an Initial Medicare Annual Wellness Visit.  I connected with  [name] on 08/02/22 by a audio enabled telemedicine application and verified that I am speaking with the correct person using two identifiers.  Patient Location: Home  Provider Location: Office/Clinic  I discussed the limitations of evaluation and management by telemedicine. The patient expressed understanding and agreed to proceed.   Review of Systems    Defer to PCP Cardiac Risk Factors include: advanced age (>17mn, >>19women);dyslipidemia;hypertension     Objective:    There were no vitals filed for this visit. There is no height or weight on file to calculate BMI.     08/02/2022    3:09 PM 03/03/2022    5:50 AM 02/27/2022    9:03 AM 12/20/2021    9:20 AM 01/03/2018   11:12 AM  Advanced Directives  Does Patient Have a Medical Advance Directive? No No No No No  Would patient like information on creating a medical advance directive? No - Patient declined No - Patient declined No - Patient declined Yes (MAU/Ambulatory/Procedural Areas - Information given) No - Patient declined    Current Medications (verified) Outpatient Encounter Medications as of 08/02/2022  Medication Sig   acetaminophen (TYLENOL) 500 MG tablet Take 1,000 mg by mouth every 8 (eight) hours as needed for headache or moderate pain.   Calcium Carb-Cholecalciferol (CALCIUM 500/VITAMIN D PO) Take 1 tablet by mouth daily.   Cholecalciferol (VITAMIN D3) 50 MCG (2000 UT) capsule Take 2,000 Units by mouth daily.   clobetasol ointment (TEMOVATE) 09.62% Apply 1 application topically daily as needed (lichen sclerosus).   COLLAGEN PO Take 1 Scoop by mouth daily. In powder form   losartan (COZAAR) 25 MG tablet TAKE ONE TABLET BY MOUTH ONE TIME DAILY   Omega-3 Fatty Acids (FISH OIL ULTRA) 1400 MG CAPS Take 1,400 mg by mouth daily.   OVER THE COUNTER MEDICATION Take 1 Dose by mouth  daily. Vitacup vitamin infused coffee   OVER THE COUNTER MEDICATION Take 2 Scoops by mouth See admin instructions. [place] otc vitamin powder Mix 2 teaspoons with liquid once daily during WINTER months ONLY   verapamil (CALAN-SR) 180 MG CR tablet TAKE TWO TABLETS BY MOUTH DAILY AT BEDTIME   No facility-administered encounter medications on file as of 08/02/2022.    Allergies (verified) Lisinopril   History: Past Medical History:  Diagnosis Date   Basal cell cancer     X 2;Dr GTonia Brooms  Complication of anesthesia    longer to come out of anesthesia   History of migraine headaches    Hyperlipidemia    Hypertension    Past Surgical History:  Procedure Laterality Date   BASAL CELL CARCINOMA EXCISION      X 2   CESAREAN SECTION      X 2; G 2 P 2   CHOLECYSTECTOMY N/A 01/09/2018   Procedure: LAPAROSCOPIC CHOLECYSTECTOMY;  Surgeon: CClovis Riley MD;  Location: [facility];  Service: General;  Laterality: N/A;   COLONOSCOPY  2012   Dr BOlevia Perches negative   dilation and curretage     Dr LDellis Filbert  EXCISION MASS UPPER EXTREMETIES Left 03/03/2022   Procedure: EXCISION OF LEFT SHOULDER SUBCUTANEOUS MASS;  Surgeon: CClovis Riley MD;  Location: [facility];  Service: General;  Laterality: Left;   WISDOM TOOTH EXTRACTION     Family History  Problem Relation Age of Onset  Cancer Mother        ovarian cancer   Stroke Sister 69   Colon polyps Sister    Hypertension Sister    Stroke Brother 45   Hypertension Brother        X 3   Cancer Maternal Grandmother        bladder cancer   Heart attack Brother 40   Diabetes Neg Hx    Colon cancer Neg Hx    Esophageal cancer Neg Hx    Stomach cancer Neg Hx    Rectal cancer Neg Hx    Social History   Socioeconomic History   Marital status: Married    Spouse name: Not on file   Number of children: Not on file   Years of education: Not on file   Highest education level: Not on file  Occupational History   Occupation: Print production planner: [employer]    Comment: retired  Tobacco Use   Smoking status: Never   Smokeless tobacco: Never  Vaping Use   Vaping Use: Never used  Substance and Sexual Activity   Alcohol use: Yes    Comment:  rarely   Drug use: No   Sexual activity: Yes  Other Topics Concern   Not on file  Social History Narrative   Exercise-- walking some days    Social Determinants of Health   Financial Resource Strain: Low Risk  (08/02/2022)   Overall Financial Resource Strain (CARDIA)    Difficulty of Paying Living Expenses: Not hard at all  Food Insecurity: No Food Insecurity (08/02/2022)   Hunger Vital Sign    Worried About Running Out of Food in the Last Year: Never true    [place] in the Last Year: Never true  Transportation Needs: No Transportation Needs (08/02/2022)   PRAPARE - Hydrologist (Medical): No    Lack of Transportation (Non-Medical): No  Physical Activity: Insufficiently Active (08/02/2022)   Exercise Vital Sign    Days of Exercise per Week: 1 day    Minutes of Exercise per Session: 20 min  Stress: No Stress Concern Present (08/02/2022)   [place]    Feeling of Stress : Only a little  Social Connections: Moderately Integrated (08/02/2022)   Social Connection and Isolation Panel [NHANES]    Frequency of Communication with Friends and Family: More than three times a week    Frequency of Social Gatherings with Friends and Family: Twice a week    Attends Religious Services: 1 to 4 times per year    Active Member of [employer] or Organizations: No    Attends Music therapist: Never    Marital Status: Living with partner    Tobacco Counseling Counseling given: Not Answered   Clinical Intake:  Pre-visit preparation completed: Yes  Pain : No/denies pain    Diabetes: No  How often do you need to have someone help you when you read instructions,  pamphlets, or other written materials from your doctor or pharmacy?: 1 - Never   Activities of Daily Living    08/02/2022    3:09 PM 02/27/2022    9:06 AM  In your present state of health, do you have any difficulty performing the following activities:  Hearing? 0   Vision? 0   Difficulty concentrating or making decisions? 1   Comment slight memory loss   Walking or climbing stairs? 0  Dressing or bathing? 0   Doing errands, shopping? 0 0  Preparing Food and eating ? N   Using the Toilet? N   In the past six months, have you accidently leaked urine? N   Do you have problems with loss of bowel control? N   Managing your Medications? N   Managing your Finances? N   Housekeeping or managing your Housekeeping? N     Patient Care Team: Carollee Herter, Alferd Apa, DO as PCP - General (Family Medicine) Murrell Redden Earlyne Iba, MD as Consulting Physician (Obstetrics and Gynecology) Renda Rolls, Jennefer Bravo, MD as Referring Physician (Dermatology)  Indicate any recent Medical Services you may have received from other than [facility] providers in the past year (date may be approximate).     Assessment:   This is a routine wellness examination for Cally.  Hearing/Vision screen No results found.  Dietary issues and exercise activities discussed: Current Exercise Habits: Home exercise routine, Type of exercise: walking, Time (Minutes): 20, Frequency (Times/Week): 1, Weekly Exercise (Minutes/Week): 20, Intensity: Moderate, Exercise limited by: None identified   Goals Addressed   None    Depression Screen    08/02/2022    3:06 PM 12/20/2021    9:18 AM 06/02/2021    2:26 PM  PHQ 2/9 Scores  PHQ - 2 Score 0 0 0  PHQ- 9 Score  0     Fall Risk    08/02/2022    3:09 PM 12/20/2021    9:18 AM 06/02/2021    2:26 PM  [place] in the past year? 0 0 0  Number falls in past yr: 0 0 0  Injury with Fall? 0 0 0  Risk for fall due to : No Fall Risks No Fall Risks   Follow up Falls evaluation  completed Falls evaluation completed Falls evaluation completed    [place]:  Any stairs in or around the home? Yes  If so, are there any without handrails? No  Home free of loose throw rugs in walkways, pet beds, electrical cords, etc? Yes  Adequate lighting in your home to reduce risk of falls? Yes   ASSISTIVE DEVICES UTILIZED TO PREVENT FALLS:  Life alert? No  Use of a cane, walker or w/c? No  Grab bars in the bathroom? Yes  Shower chair or bench in shower? Yes  Elevated toilet seat or a handicapped toilet? No   TIMED UP AND GO:  Was the test performed?  No, audio visit .    Cognitive Function:        08/02/2022    3:14 PM  6CIT Screen  What Year? 0 points  What month? 0 points  What time? 0 points  Count back from 20 0 points  Months in reverse 0 points  Repeat phrase 0 points  Total Score 0 points    Immunizations Immunization History  Administered Date(s) Administered   Influenza Inj Mdck Quad Pf 06/18/2017, 05/30/2018   Influenza-Unspecified 04/15/2015, 05/11/2016, 05/25/2019, 06/04/2020, 06/01/2022   PFIZER(Purple Top)SARS-COV-2 Vaccination 10/22/2019, 06/04/2020   PNEUMOCOCCAL CONJUGATE-20 12/20/2021   Tdap 01/12/2012    TDAP status: Due, Education has been provided regarding the importance of this vaccine. Advised may receive this vaccine at local pharmacy or Health Dept. Aware to provide a copy of the vaccination record if obtained from local pharmacy or Health Dept. Verbalized acceptance and understanding.  Flu Vaccine status: Up to date  Pneumococcal vaccine status: Up to date  Covid-19 vaccine  status: Information provided on how to obtain vaccines.   Qualifies for Shingles Vaccine? Yes   Zostavax completed No   Shingrix Completed?: No.    Education has been provided regarding the importance of this vaccine. Patient has been advised to call insurance company to determine out of pocket expense if they have not  yet received this vaccine. Advised may also receive vaccine at local pharmacy or Health Dept. Verbalized acceptance and understanding.  Screening Tests Health Maintenance  Topic Date Due   Medicare Annual Wellness (AWV)  Never done   Zoster Vaccines- Shingrix (1 of 2) Never done   MAMMOGRAM  12/30/2020   DEXA SCAN  Never done   DTaP/Tdap/Td (2 - Td or Tdap) 01/11/2022   COVID-19 Vaccine (3 - Pfizer risk series) 08/16/2022 (Originally 07/02/2020)   COLONOSCOPY (Pts 45-5yr Insurance coverage will need to be confirmed)  10/30/2027   Pneumonia Vaccine 66 Years old  Completed   INFLUENZA VACCINE  Completed   Hepatitis C Screening  Completed   HPV VACCINES  Aged Out    Health Maintenance  Health Maintenance Due  Topic Date Due   Medicare Annual Wellness (AWV)  Never done   Zoster Vaccines- Shingrix (1 of 2) Never done   MAMMOGRAM  12/30/2020   DEXA SCAN  Never done   DTaP/Tdap/Td (2 - Td or Tdap) 01/11/2022    Colorectal cancer screening: Type of screening: Colonoscopy. Completed 10/29/20. Repeat every 7 years  Mammogram status: Completed April/May 2023 per pt. Repeat every year  Bone Density status: Completed April/May 2023 per pt. Results reflect: Bone density results: NORMAL. Repeat every 2 years.  Lung Cancer Screening: (Low Dose CT Chest recommended if Age 66-80years, 30 pack-year currently smoking OR have quit w/in 15years.) does not qualify.   Additional Screening:  Hepatitis C Screening: does qualify; Completed 05/17/15  Vision Screening: Recommended annual ophthalmology exams for early detection of glaucoma and other disorders of the eye. Is the patient up to date with their annual eye exam?  Yes  Who is the provider or what is the name of the office in which the patient attends annual eye exams? [facility]. If pt is not established with a provider, would they like to be referred to a provider to establish care? No .   Dental Screening: Recommended annual dental  exams for proper oral hygiene  Community Resource Referral / Chronic Care Management: CRR required this visit?  No   CCM required this visit?  No      Plan:     I have personally reviewed and noted the following in the patient's chart:   Medical and social history Use of alcohol, tobacco or illicit drugs  Current medications and supplements including opioid prescriptions. Patient is not currently taking opioid prescriptions. Functional ability and status Nutritional status Physical activity Advanced directives List of other physicians Hospitalizations, surgeries, and ER visits in previous 12 months Vitals Screenings to include cognitive, depression, and falls Referrals and appointments  In addition, I have reviewed and discussed with patient certain preventive protocols, quality metrics, and best practice recommendations. A written personalized care plan for preventive services as well as general preventive health recommendations were provided to patient.   Due to this being a telephonic visit, the after visit summary with patients personalized plan was offered to patient via mail or my-chart. Patient would like to access on my-chart.  BBeatris Ship [place]  08/02/2022   Nurse Notes: None

## 2022-12-25 ENCOUNTER — Other Ambulatory Visit: Payer: Self-pay

## 2022-12-25 DIAGNOSIS — I728 Aneurysm of other specified arteries: Secondary | ICD-10-CM

## 2023-01-03 ENCOUNTER — Encounter (HOSPITAL_BASED_OUTPATIENT_CLINIC_OR_DEPARTMENT_OTHER): Payer: Self-pay

## 2023-01-03 ENCOUNTER — Ambulatory Visit (HOSPITAL_BASED_OUTPATIENT_CLINIC_OR_DEPARTMENT_OTHER)
Admission: RE | Admit: 2023-01-03 | Discharge: 2023-01-03 | Disposition: A | Discharge status: Home / Self Care | Payer: Medicare Other | Source: Ambulatory Visit | Attending: Vascular Surgery | Admitting: Vascular Surgery

## 2023-01-03 DIAGNOSIS — I728 Aneurysm of other specified arteries: Secondary | ICD-10-CM | POA: Diagnosis present

## 2023-01-03 MED ORDER — IOHEXOL 300 MG/ML  SOLN
100.0000 mL | Freq: Once | INTRAMUSCULAR | Status: AC | PRN
  Administered 2023-01-03: 100 mL via INTRAVENOUS

## 2023-01-20 ENCOUNTER — Ambulatory Visit
Admission: RE | Admit: 2023-01-20 | Discharge: 2023-01-20 | Disposition: A | Discharge status: Home / Self Care | Payer: Medicare Other | Source: Ambulatory Visit | Attending: Family Medicine | Admitting: Family Medicine

## 2023-01-20 ENCOUNTER — Other Ambulatory Visit: Payer: Self-pay

## 2023-01-20 VITALS — BP 125/74 | HR 75 | Temp 98.4°F | Resp 17

## 2023-01-20 DIAGNOSIS — B029 Zoster without complications: Secondary | ICD-10-CM

## 2023-01-20 MED ORDER — VALACYCLOVIR HCL 1 G PO TABS: 1000.0000 mg | ORAL_TABLET | Freq: Three times a day (TID) | ORAL | 0 refills | Status: DC

## 2023-01-20 NOTE — ED Triage Notes (Signed)
Pt c/o rash on RT side of abd that wraps around to back. Slightly sore, red and blistered.

## 2023-01-20 NOTE — ED Provider Notes (Signed)
[facility]    CSN: [id number] Arrival date & time: 01/20/23  8295      History   Chief Complaint Chief Complaint  Patient presents with   Rash    APPT 9AM    HPI Megan Murphy is a 67 y.o. female.   HPI Pleasant 67 year old female presents with rash of right side of abdomen that wraps around to her back.  Only slightly sore, red and blistered.  Patient reports noticing rash involve beginning on Wednesday of this week.  PMH significant of BCC, HLD, and HTN.  Past Medical History:  Diagnosis Date   Basal cell cancer     X 2;Dr Danella Deis   Complication of anesthesia    longer to come out of anesthesia   History of migraine headaches    Hyperlipidemia    Hypertension     Patient Active Problem List   Diagnosis Date Noted   Need for tetanus booster 07/31/2022   Left upper quadrant abdominal pain 06/02/2021   Preventative health care 11/09/2020   Snoring 08/02/2020   Vitamin D deficiency 08/02/2020   Stress at work 04/19/2020   Numbness and tingling in left hand 04/24/2019   Chest pain 04/24/2019   RUQ pain 11/08/2017   Dizziness and giddiness 05/17/2015   Essential hypertension 04/05/2010   ARTHRALGIA 04/05/2010   SKIN CANCER, HX OF 09/08/2008   HYPERLIPIDEMIA 07/29/2007    Past Surgical History:  Procedure Laterality Date   BASAL CELL CARCINOMA EXCISION      X 2   CESAREAN SECTION      X 2; G 2 P 2   CHOLECYSTECTOMY N/A 01/09/2018   Procedure: LAPAROSCOPIC CHOLECYSTECTOMY;  Surgeon: Berna Bue, MD;  Location: [facility];  Service: General;  Laterality: N/A;   COLONOSCOPY  2012   Dr Juanda Chance, negative   dilation and curretage     Dr Seymour Bars   EXCISION MASS UPPER EXTREMETIES Left 03/03/2022   Procedure: EXCISION OF LEFT SHOULDER SUBCUTANEOUS MASS;  Surgeon: Berna Bue, MD;  Location: [facility];  Service: General;  Laterality: Left;   WISDOM TOOTH EXTRACTION      OB History   No obstetric history on file.      Home Medications     Prior to Admission medications   Medication Sig Start Date End Date Taking? Authorizing Provider  valACYclovir (VALTREX) 1000 MG tablet Take 1 tablet (1,000 mg total) by mouth 3 (three) times daily. 01/20/23  Yes Trevor Iha, FNP  acetaminophen (TYLENOL) 500 MG tablet Take 1,000 mg by mouth every 8 (eight) hours as needed for headache or moderate pain.    [provider]  Calcium Carb-Cholecalciferol (CALCIUM 500/VITAMIN D PO) Take 1 tablet by mouth daily.    [provider]  Cholecalciferol (VITAMIN D3) 50 MCG (2000 UT) capsule Take 2,000 Units by mouth daily.    [provider]  clobetasol ointment (TEMOVATE) 0.05 % Apply 1 application topically daily as needed (lichen sclerosus). 02/11/19   Seabron Spates R, DO  COLLAGEN PO Take 1 Scoop by mouth daily. In powder form    [provider]  losartan (COZAAR) 25 MG tablet TAKE ONE TABLET BY MOUTH ONE TIME DAILY 07/31/22   Zola Button, Grayling Congress, DO  Omega-3 Fatty Acids (FISH OIL ULTRA) 1400 MG CAPS Take 1,400 mg by mouth daily.    [provider]  OVER THE COUNTER MEDICATION Take 1 Dose by mouth daily. Vitacup vitamin infused coffee    [provider]  OVER THE COUNTER MEDICATION Take 2 Scoops by mouth See admin instructions. Baobab otc vitamin powder Mix 2 teaspoons with liquid once daily during WINTER months ONLY    [provider]  verapamil (CALAN-SR) 180 MG CR tablet TAKE TWO TABLETS BY MOUTH DAILY AT BEDTIME 07/31/22   Donato Schultz, DO    Family History Family History  Problem Relation Age of Onset   Cancer Mother        ovarian cancer   Stroke Sister 41   Colon polyps Sister    Hypertension Sister    Stroke Brother 67   Hypertension Brother        X 3   Cancer Maternal Grandmother        bladder cancer   Heart attack Brother 87   Diabetes Neg Hx    Colon cancer Neg Hx    Esophageal cancer Neg Hx    Stomach cancer Neg Hx    Rectal cancer Neg Hx      Social History Social History   Tobacco Use   Smoking status: Never   Smokeless tobacco: Never  Vaping Use   Vaping Use: Never used  Substance Use Topics   Alcohol use: Yes    Comment:  rarely   Drug use: No     Allergies   Lisinopril   Review of Systems Review of Systems  Skin:  Positive for rash.  All other systems reviewed and are negative.    Physical Exam Triage Vital Signs ED Triage Vitals  Enc Vitals Group     BP 01/20/23 0841 125/74     Pulse Rate 01/20/23 0841 75     Resp 01/20/23 0841 17     Temp 01/20/23 0841 98.4 F (36.9 C)     Temp Source 01/20/23 0841 Oral     SpO2 01/20/23 0841 98 %     Weight --      Height --      Head Circumference --      Peak Flow --      Pain Score 01/20/23 0842 1     Pain Loc --      Pain Edu? --      Excl. in GC? --    No data found.  Updated Vital Signs BP 125/74 (BP Location: Right Arm)   Pulse 75   Temp 98.4 F (36.9 C) (Oral)   Resp 17   SpO2 98%    Physical Exam Vitals and nursing note reviewed.  Constitutional:      General: She is not in acute distress.    Appearance: Normal appearance. She is normal weight. She is not ill-appearing.  HENT:     Head: Normocephalic and atraumatic.     Mouth/Throat:     Mouth: Mucous membranes are moist.     Pharynx: Oropharynx is clear.  Eyes:     Extraocular Movements: Extraocular movements intact.     Conjunctiva/sclera: Conjunctivae normal.     Pupils: Pupils are equal, round, and reactive to light.  Cardiovascular:     Rate and Rhythm: Normal rate and regular rhythm.     Pulses: Normal pulses.     Heart sounds: Normal heart sounds. No murmur heard. Pulmonary:     Effort: Pulmonary effort is normal.     Breath sounds: Normal breath sounds. No wheezing, rhonchi or rales.  Musculoskeletal:        General: Normal range of motion.     Cervical back: Normal  range of motion and neck supple.  Skin:    General: Skin is warm and dry.     Comments:  Chest/Back (right-sided at inferior lateral mammary fold to central mid back): Erythematous, grouped vesicular lesions noted-please see images below  Neurological:     General: No focal deficit present.     Mental Status: She is alert and oriented to person, place, and time. Mental status is at baseline.  Psychiatric:        Mood and Affect: Mood normal.        Behavior: Behavior normal.        Thought Content: Thought content normal.         UC Treatments / Results  Labs (all labs ordered are listed, but only abnormal results are displayed) Labs Reviewed - No data to display  EKG   Radiology No results found.  Procedures Procedures (including critical care time)  Medications Ordered in UC Medications - No data to display  Initial Impression / Assessment and Plan / UC Course  I have reviewed the triage vital signs and the nursing notes.  Pertinent labs & imaging results that were available during my care of the patient were reviewed by me and considered in my medical decision making (see chart for details).     MDM: 1.  Herpes zoster without complication-Rx'd Valtrex 1 g 3 times daily x 7 days. Instructed patient to take medication as directed with food to completion.  Encouraged to increase daily water intake to 64 ounces per day while taking this medication.  Advised if symptoms worsen and/or unresolved please follow-up PCP or here for further evaluation.  Patient discharged home, hemodynamically stable.  Final Clinical Impressions(s) / UC Diagnoses   Final diagnoses:  Herpes zoster without complication     Discharge Instructions      Instructed patient to take medication as directed with food to completion.  Encouraged to increase daily water intake to 64 ounces per day while taking this medication.  Advised if symptoms worsen and/or unresolved please follow-up PCP or here for further evaluation.     ED Prescriptions     Medication Sig Dispense Auth.  Provider   valACYclovir (VALTREX) 1000 MG tablet Take 1 tablet (1,000 mg total) by mouth 3 (three) times daily. 21 tablet Trevor Iha, FNP      PDMP not reviewed this encounter.   Trevor Iha, FNP 01/20/23 [phone]

## 2023-01-20 NOTE — Discharge Instructions (Addendum)
Instructed patient to take medication as directed with food to completion.  Encouraged to increase daily water intake to 64 ounces per day while taking this medication.  Advised if symptoms worsen and/or unresolved please follow-up PCP or here for further evaluation. 

## 2023-01-25 ENCOUNTER — Ambulatory Visit (INDEPENDENT_AMBULATORY_CARE_PROVIDER_SITE_OTHER): Payer: Medicare Other | Admitting: Vascular Surgery

## 2023-01-25 ENCOUNTER — Encounter: Payer: Self-pay | Admitting: Vascular Surgery

## 2023-01-25 VITALS — BP 125/84 | HR 72 | Temp 98.0°F | Resp 20 | Ht 63.0 in | Wt 155.0 lb

## 2023-01-25 DIAGNOSIS — I728 Aneurysm of other specified arteries: Secondary | ICD-10-CM

## 2023-01-25 NOTE — Progress Notes (Signed)
REASON FOR VISIT:   Follow-up of splenic artery aneurysm.  MEDICAL ISSUES:   SPLENIC ARTERY ANEURYSM: This patient has a 2.4 cm splenic artery aneurysm near the hilum.  This has not changed in size over the last year.  She is known about this for many years.  She is postmenopausal and the risk of rupture for aneurysm of this size is very small.  Based on the location I think it would be hard to see with a duplex.  Therefore I have ordered a follow-up CT angio of the abdomen in 1 year.  I explained that I will be retiring and so she will be seeing one of my partners at that time.  HPI:   Megan Murphy is a pleasant 67 y.o. female who I last saw on 01/05/2022 with a splenic artery aneurysm.  This was asymptomatic.  Apparently this was not a new finding as she had known about this previously.  As she was postmenopausal I thought the risk of rupture was very small and we were simply following this.  She comes in for a follow-up CT angiogram of the abdomen and a 1 year follow-up visit.  Since I saw her last, she denies any abdominal pain or back pain.  She has had no hematuria.  She is not a smoker.  Past Medical History:  Diagnosis Date   Basal cell cancer     X 2;Dr Danella Deis   Complication of anesthesia    longer to come out of anesthesia   History of migraine headaches    Hyperlipidemia    Hypertension     Family History  Problem Relation Age of Onset   Cancer Mother        ovarian cancer   Stroke Sister 60   Colon polyps Sister    Hypertension Sister    Stroke Brother 64   Hypertension Brother        X 3   Cancer Maternal Grandmother        bladder cancer   Heart attack Brother 2   Diabetes Neg Hx    Colon cancer Neg Hx    Esophageal cancer Neg Hx    Stomach cancer Neg Hx    Rectal cancer Neg Hx     SOCIAL HISTORY: Social History   Tobacco Use   Smoking status: Never   Smokeless tobacco: Never  Substance Use Topics   Alcohol use: Yes    Comment:  rarely     Allergies  Allergen Reactions   Lisinopril     cough    Current Outpatient Medications  Medication Sig Dispense Refill   acetaminophen (TYLENOL) 500 MG tablet Take 1,000 mg by mouth every 8 (eight) hours as needed for headache or moderate pain.     Calcium Carb-Cholecalciferol (CALCIUM 500/VITAMIN D PO) Take 1 tablet by mouth daily.     Cholecalciferol (VITAMIN D3) 50 MCG (2000 UT) capsule Take 2,000 Units by mouth daily.     clobetasol ointment (TEMOVATE) 0.05 % Apply 1 application topically daily as needed (lichen sclerosus). 30 g 1   COLLAGEN PO Take 1 Scoop by mouth daily. In powder form     losartan (COZAAR) 25 MG tablet TAKE ONE TABLET BY MOUTH ONE TIME DAILY 90 tablet 1   Omega-3 Fatty Acids (FISH OIL ULTRA) 1400 MG CAPS Take 1,400 mg by mouth daily.     OVER THE COUNTER MEDICATION Take 2 Scoops by mouth See admin instructions. Baobab otc vitamin powder Mix  2 teaspoons with liquid once daily during WINTER months ONLY     valACYclovir (VALTREX) 1000 MG tablet Take 1 tablet (1,000 mg total) by mouth 3 (three) times daily. 21 tablet 0   verapamil (CALAN-SR) 180 MG CR tablet TAKE TWO TABLETS BY MOUTH DAILY AT BEDTIME 180 tablet 1   No current facility-administered medications for this visit.    REVIEW OF SYSTEMS:  [X]  denotes positive finding, [ ]  denotes negative finding Cardiac  Comments:  Chest pain or chest pressure:    Shortness of breath upon exertion:    Short of breath when lying flat:    Irregular heart rhythm:        Vascular    Pain in calf, thigh, or hip brought on by ambulation:    Pain in feet at night that wakes you up from your sleep:     Blood clot in your veins:    Leg swelling:         Pulmonary    Oxygen at home:    Productive cough:     Wheezing:         Neurologic    Sudden weakness in arms or legs:     Sudden numbness in arms or legs:     Sudden onset of difficulty speaking or slurred speech:    Temporary loss of vision in one eye:      Problems with dizziness:         Gastrointestinal    Blood in stool:     Vomited blood:         Genitourinary    Burning when urinating:     Blood in urine:        Psychiatric    Major depression:         Hematologic    Bleeding problems:    Problems with blood clotting too easily:        Skin    Rashes or ulcers:        Constitutional    Fever or chills:     PHYSICAL EXAM:   Vitals:   01/25/23 1241  BP: 125/84  Pulse: 72  Resp: 20  Temp: 98 F (36.7 C)  SpO2: 96%  Weight: 155 lb (70.3 kg)  Height: 5\' 3"  (1.6 m)    GENERAL: The patient is a well-nourished female, in no acute distress. The vital signs are documented above. CARDIAC: There is a regular rate and rhythm.  VASCULAR: I do not detect carotid bruits. She has palpable femoral pulses and pedal pulses bilaterally. PULMONARY: There is good air exchange bilaterally without wheezing or rales. ABDOMEN: Soft and non-tender with normal pitched bowel sounds.  MUSCULOSKELETAL: There are no major deformities or cyanosis. NEUROLOGIC: No focal weakness or paresthesias are detected. SKIN: There are no ulcers or rashes noted. PSYCHIATRIC: The patient has a normal affect.  DATA:    CT ANGIOGRAM ABDOMEN: I reviewed the images of the CT angiogram of the abdomen.  The maximum diameter of her splenic artery aneurysm is 2.4 cm.  The aneurysm is calcified.  It has not changed significantly in morphology compared to the previous study.  Waverly Ferrari [facility] [phone]

## 2023-01-30 ENCOUNTER — Ambulatory Visit: Payer: Medicare Other | Admitting: Family Medicine

## 2023-02-05 ENCOUNTER — Ambulatory Visit (INDEPENDENT_AMBULATORY_CARE_PROVIDER_SITE_OTHER): Payer: Medicare Other | Admitting: Family Medicine

## 2023-02-05 ENCOUNTER — Encounter: Payer: Self-pay | Admitting: Family Medicine

## 2023-02-05 VITALS — BP 110/80 | HR 70 | Temp 97.4°F | Resp 18 | Ht 63.0 in | Wt 156.4 lb

## 2023-02-05 DIAGNOSIS — E782 Mixed hyperlipidemia: Secondary | ICD-10-CM | POA: Diagnosis not present

## 2023-02-05 DIAGNOSIS — I10 Essential (primary) hypertension: Secondary | ICD-10-CM

## 2023-02-05 DIAGNOSIS — E559 Vitamin D deficiency, unspecified: Secondary | ICD-10-CM

## 2023-02-05 DIAGNOSIS — E785 Hyperlipidemia, unspecified: Secondary | ICD-10-CM

## 2023-02-05 LAB — COMPREHENSIVE METABOLIC PANEL
ALT: 16 U/L (ref 0–35)
AST: 15 U/L (ref 0–37)
Albumin: 3.9 g/dL (ref 3.5–5.2)
Alkaline Phosphatase: 90 U/L (ref 39–117)
BUN: 12 mg/dL (ref 6–23)
CO2: 29 mEq/L (ref 19–32)
Calcium: 9.1 mg/dL (ref 8.4–10.5)
Chloride: 103 mEq/L (ref 96–112)
Creatinine, Ser: 0.7 mg/dL (ref 0.40–1.20)
GFR: 89.76 mL/min (ref >60.00)
Glucose, Bld: 90 mg/dL (ref 70–99)
Potassium: 4.1 mEq/L (ref 3.5–5.1)
Sodium: 140 mEq/L (ref 135–145)
Total Bilirubin: 0.5 mg/dL (ref 0.2–1.2)
Total Protein: 7.1 g/dL (ref 6.0–8.3)

## 2023-02-05 LAB — VITAMIN D 25 HYDROXY (VIT D DEFICIENCY, FRACTURES): VITD: 33.24 ng/mL (ref 30.00–100.00)

## 2023-02-05 LAB — LIPID PANEL
Cholesterol: 250 mg/dL — ABNORMAL HIGH (ref 0–200)
HDL: 84.1 mg/dL (ref >39.00)
LDL Cholesterol: 142 mg/dL — ABNORMAL HIGH (ref 0–99)
NonHDL: 165.83
Total CHOL/HDL Ratio: 3
Triglycerides: 121 mg/dL (ref 0.0–149.0)
VLDL: 24.2 mg/dL (ref 0.0–40.0)

## 2023-02-05 MED ORDER — VERAPAMIL HCL ER 180 MG PO TBCR: EXTENDED_RELEASE_TABLET | ORAL | 1 refills | Status: DC

## 2023-02-05 MED ORDER — LOSARTAN POTASSIUM 25 MG PO TABS: ORAL_TABLET | ORAL | 1 refills | Status: DC

## 2023-02-05 NOTE — Assessment & Plan Note (Signed)
Check labs today.

## 2023-02-05 NOTE — Patient Instructions (Signed)
Cholesterol Content in Foods Cholesterol is a waxy, fat-like substance that helps to carry fat in the blood. The body needs cholesterol in small amounts, but too much cholesterol can cause damage to the arteries and heart. What foods have cholesterol?  Cholesterol is found in animal-based foods, such as meat, seafood, and dairy. Generally, low-fat dairy and lean meats have less cholesterol than full-fat dairy and fatty meats. The milligrams of cholesterol per serving (mg per serving) of common cholesterol-containing foods are listed below. Meats and other proteins Egg -- one large whole egg has 186 mg. Veal shank -- 4 oz (113 g) has 141 mg. Lean ground turkey (93% lean) -- 4 oz (113 g) has 118 mg. Fat-trimmed lamb loin -- 4 oz (113 g) has 106 mg. Lean ground beef (90% lean) -- 4 oz (113 g) has 100 mg. Lobster -- 3.5 oz (99 g) has 90 mg. Pork loin chops -- 4 oz (113 g) has 86 mg. Canned salmon -- 3.5 oz (99 g) has 83 mg. Fat-trimmed beef top loin -- 4 oz (113 g) has 78 mg. Frankfurter -- 1 frank (3.5 oz or 99 g) has 77 mg. Crab -- 3.5 oz (99 g) has 71 mg. Roasted chicken without skin, white meat -- 4 oz (113 g) has 66 mg. Light bologna -- 2 oz (57 g) has 45 mg. Deli-cut turkey -- 2 oz (57 g) has 31 mg. Canned tuna -- 3.5 oz (99 g) has 31 mg. Bacon -- 1 oz (28 g) has 29 mg. Oysters and mussels (raw) -- 3.5 oz (99 g) has 25 mg. Mackerel -- 1 oz (28 g) has 22 mg. Trout -- 1 oz (28 g) has 20 mg. Pork sausage -- 1 link (1 oz or 28 g) has 17 mg. Salmon -- 1 oz (28 g) has 16 mg. Tilapia -- 1 oz (28 g) has 14 mg. Dairy Soft-serve ice cream --  cup (4 oz or 86 g) has 103 mg. Whole-milk yogurt -- 1 cup (8 oz or 245 g) has 29 mg. Cheddar cheese -- 1 oz (28 g) has 28 mg. American cheese -- 1 oz (28 g) has 28 mg. Whole milk -- 1 cup (8 oz or 250 mL) has 23 mg. 2% milk -- 1 cup (8 oz or 250 mL) has 18 mg. Cream cheese -- 1 tablespoon (Tbsp) (14.5 g) has 15 mg. Cottage cheese --  cup (4 oz or  113 g) has 14 mg. Low-fat (1%) milk -- 1 cup (8 oz or 250 mL) has 10 mg. Sour cream -- 1 Tbsp (12 g) has 8.5 mg. Low-fat yogurt -- 1 cup (8 oz or 245 g) has 8 mg. Nonfat Greek yogurt -- 1 cup (8 oz or 228 g) has 7 mg. Half-and-half cream -- 1 Tbsp (15 mL) has 5 mg. Fats and oils Cod liver oil -- 1 tablespoon (Tbsp) (13.6 g) has 82 mg. Butter -- 1 Tbsp (14 g) has 15 mg. Lard -- 1 Tbsp (12.8 g) has 14 mg. Bacon grease -- 1 Tbsp (12.9 g) has 14 mg. Mayonnaise -- 1 Tbsp (13.8 g) has 5-10 mg. Margarine -- 1 Tbsp (14 g) has 3-10 mg. The items listed above may not be a complete list of foods with cholesterol. Exact amounts of cholesterol in these foods may vary depending on specific ingredients and brands. Contact a dietitian for more information. What foods do not have cholesterol? Most plant-based foods do not have cholesterol unless you combine them with a food that has   cholesterol. Foods without cholesterol include: Grains and cereals. Vegetables. Fruits. Vegetable oils, such as olive, canola, and sunflower oil. Legumes, such as peas, beans, and lentils. Nuts and seeds. Egg whites. The items listed above may not be a complete list of foods that do not have cholesterol. Contact a dietitian for more information. Summary The body needs cholesterol in small amounts, but too much cholesterol can cause damage to the arteries and heart. Cholesterol is found in animal-based foods, such as meat, seafood, and dairy. Generally, low-fat dairy and lean meats have less cholesterol than full-fat dairy and fatty meats. This information is not intended to replace advice given to you by your health care provider. Make sure you discuss any questions you have with your health care provider. Document Revised: 12/17/2020 Document Reviewed: 12/17/2020 Elsevier Patient Education  2024 [employer]  

## 2023-02-05 NOTE — Assessment & Plan Note (Signed)
Well controlled, no changes to meds. Encouraged heart healthy diet such as the DASH diet and exercise as tolerated.  °

## 2023-02-05 NOTE — Progress Notes (Signed)
Established Patient Office Visit  Subjective   Patient ID: Megan Murphy, female    DOB: 09-21-1955  Age: 67 y.o. MRN: [id number]  Chief Complaint  Patient presents with   Hypertension   Follow-up    HPI Discussed the use of AI scribe software for clinical note transcription with the patient, who gave verbal consent to proceed.  History of Present Illness   The patient, with a history of hyperlipidemia and hypertension, presents for a follow-up visit. They have been compliant with their fish oil regimen. However, their exercise routine was interrupted due to a recent bout of shingles, which started in early June. The shingles rash, initially mistaken for bug bites, is located on the side of their torso and across their back. They sought treatment within three days of the onset of symptoms. The rash is drying up but still occasionally leaks onto their clothing. They completed a seven-day course of Valtrex for the shingles.  The patient also reports a significant amount of stress due to family health issues. Their daughter, an anesthesiologist, is dealing with workplace stress due to staffing changes. Additionally, their four-year-old grandson recently had a severe strep infection that affected his kidneys, requiring blood pressure medication. 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      Patient Active Problem List   Diagnosis Date Noted   Need for tetanus booster 07/31/2022   Left upper quadrant abdominal pain 06/02/2021   Preventative health care 11/09/2020   Snoring 08/02/2020   Vitamin D deficiency 08/02/2020   Stress at work 04/19/2020   Numbness and tingling in left hand 04/24/2019   Chest pain 04/24/2019   RUQ pain 11/08/2017   Dizziness and giddiness 05/17/2015   Essential hypertension 04/05/2010   ARTHRALGIA 04/05/2010   SKIN CANCER, HX OF 09/08/2008   HYPERLIPIDEMIA 07/29/2007   Past Medical History:  Diagnosis Date    Basal cell cancer     X 2;Dr Danella Deis   Complication of anesthesia    longer to come out of anesthesia   History of migraine headaches    Hyperlipidemia    Hypertension    Past Surgical History:  Procedure Laterality Date   BASAL CELL CARCINOMA EXCISION      X 2   CESAREAN SECTION      X 2; G 2 P 2   CHOLECYSTECTOMY N/A 01/09/2018   Procedure: LAPAROSCOPIC CHOLECYSTECTOMY;  Surgeon: Berna Bue, MD;  Location: [facility];  Service: General;  Laterality: N/A;   COLONOSCOPY  2012   Dr Juanda Chance, negative   dilation and curretage     Dr Seymour Bars   EXCISION MASS UPPER EXTREMETIES Left 03/03/2022   Procedure: EXCISION OF LEFT SHOULDER SUBCUTANEOUS MASS;  Surgeon: Berna Bue, MD;  Location: [facility];  Service: General;  Laterality: Left;   WISDOM TOOTH EXTRACTION     Social History   Tobacco Use   Smoking status: Never   Smokeless tobacco: Never  Vaping Use   Vaping Use: Never used  Substance Use Topics   Alcohol use: Yes    Comment:  rarely   Drug use: No   Social History   Socioeconomic History   Marital status: Married    Spouse name: Not on file   Number of children: Not on file   Years of education: Not on file   Highest education level: 12th grade  Occupational History   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  Tobacco Use   Smoking status: Never   Smokeless tobacco: Never  Vaping Use   Vaping Use: Never used  Substance and Sexual Activity   Alcohol use: Yes    Comment:  rarely   Drug use: No   Sexual activity: Yes  Other Topics Concern   Not on file  Social History Narrative   Exercise-- walking some days    Social Determinants of Health   Financial Resource Strain: Low Risk  (02/02/2023)   Overall Financial Resource Strain (CARDIA)    Difficulty of Paying Living Expenses: Not hard at all  Food Insecurity: No Food Insecurity (02/02/2023)   Hunger Vital Sign    Worried About Running Out of Food in the Last Year:  Never true    Ran Out of Food in the Last Year: Never true  Transportation Needs: No Transportation Needs (02/02/2023)   PRAPARE - Administrator, Civil Service (Medical): No    Lack of Transportation (Non-Medical): No  Physical Activity: Inactive (02/02/2023)   Exercise Vital Sign    Days of Exercise per Week: 0 days    Minutes of Exercise per Session: 20 min  Stress: No Stress Concern Present (02/02/2023)   [employer] of Occupational Health - Occupational Stress Questionnaire    Feeling of Stress : Only a little  Social Connections: Moderately Integrated (02/02/2023)   Social Connection and Isolation Panel [NHANES]    Frequency of Communication with Friends and Family: More than three times a week    Frequency of Social Gatherings with Friends and Family: Three times a week    Attends Religious Services: 1 to 4 times per year    Active Member of Clubs or Organizations: No    Attends Banker Meetings: Never    Marital Status: Married  Catering manager Violence: Not At Risk (08/02/2022)   Humiliation, Afraid, Rape, and Kick questionnaire    Fear of Current or Ex-Partner: No    Emotionally Abused: No    Physically Abused: No    Sexually Abused: No   Family Status  Relation Name Status   Mother  (Not Specified)   Sister  (Not Specified)   Brother  (Not Specified)   MGM  (Not Specified)   Brother  Deceased at age 83       mi   Neg Hx  (Not Specified)   Family History  Problem Relation Age of Onset   Cancer Mother        ovarian cancer   Stroke Sister 49   Colon polyps Sister    Hypertension Sister    Stroke Brother 24   Hypertension Brother        X 3   Cancer Maternal Grandmother        bladder cancer   Heart attack Brother 35   Diabetes Neg Hx    Colon cancer Neg Hx    Esophageal cancer Neg Hx    Stomach cancer Neg Hx    Rectal cancer Neg Hx    Allergies  Allergen Reactions   Lisinopril     cough      ROS    Objective:      BP 110/80 (BP Location: Left Arm, Patient Position: Sitting, Cuff Size: Normal)   Pulse 70   Temp (!) 97.4 F (36.3 C) (Oral)   Resp 18   Ht 5\' 3"  (1.6 m)   Wt 156 lb 6.4 oz (70.9 kg)   SpO2 98%  BMI 27.71 kg/m  BP Readings from Last 3 Encounters:  02/05/23 110/80  01/25/23 125/84  01/20/23 125/74   Wt Readings from Last 3 Encounters:  02/05/23 156 lb 6.4 oz (70.9 kg)  01/25/23 155 lb (70.3 kg)  07/31/22 153 lb 12.8 oz (69.8 kg)   SpO2 Readings from Last 3 Encounters:  02/05/23 98%  01/25/23 96%  01/20/23 98%      Physical Exam Vitals and nursing note reviewed.  Constitutional:      Appearance: She is well-developed.  HENT:     Head: Normocephalic and atraumatic.  Eyes:     Conjunctiva/sclera: Conjunctivae normal.  Neck:     Thyroid: No thyromegaly.     Vascular: No carotid bruit or JVD.  Cardiovascular:     Rate and Rhythm: Normal rate and regular rhythm.     Heart sounds: Normal heart sounds. No murmur heard. Pulmonary:     Effort: Pulmonary effort is normal. No respiratory distress.     Breath sounds: Normal breath sounds. No wheezing or rales.  Chest:     Chest wall: No tenderness.  Musculoskeletal:     Cervical back: Normal range of motion and neck supple.  Neurological:     General: No focal deficit present.     Mental Status: She is alert and oriented to person, place, and time.  Psychiatric:        Mood and Affect: Mood normal.        Behavior: Behavior normal.      No results found for any visits on 02/05/23.  Last CBC Lab Results  Component Value Date   WBC 4.7 12/20/2021   HGB 14.1 12/20/2021   HCT 42.2 12/20/2021   MCV 96.6 12/20/2021   MCH 32.4 01/03/2018   RDW 12.4 12/20/2021   PLT 265.0 12/20/2021   Last metabolic panel Lab Results  Component Value Date   GLUCOSE 78 07/31/2022   NA 139 07/31/2022   K 3.8 07/31/2022   CL 102 07/31/2022   CO2 29 07/31/2022   BUN 22 07/31/2022   CREATININE 0.80 07/31/2022   GFRNONAA >60  02/27/2022   CALCIUM 9.1 07/31/2022   PROT 6.6 07/31/2022   ALBUMIN 4.0 07/31/2022   BILITOT 0.3 07/31/2022   ALKPHOS 88 07/31/2022   AST 14 07/31/2022   ALT 12 07/31/2022   ANIONGAP 6 02/27/2022   Last lipids Lab Results  Component Value Date   CHOL 274 (H) 07/31/2022   HDL 90.10 07/31/2022   LDLCALC 147 (H) 07/31/2022   LDLDIRECT 134.3 03/13/2012   TRIG 183.0 (H) 07/31/2022   CHOLHDL 3 07/31/2022   Last hemoglobin A1c Lab Results  Component Value Date   HGBA1C 5.5 03/07/2011   Last thyroid functions Lab Results  Component Value Date   TSH 1.93 11/09/2020   Last vitamin D Lab Results  Component Value Date   VD25OH 40.24 11/09/2020   Last vitamin B12 and Folate Lab Results  Component Value Date   VITAMINB12 345 12/09/2015   FOLATE 8.0 09/08/2008      The 10-year ASCVD risk score (Arnett DK, et al., 2019) is: 6%    Assessment & Plan:   Problem List Items Addressed This Visit       Unprioritized   Vitamin D deficiency - Primary    Check labs today      Relevant Orders   VITAMIN D 25 Hydroxy (Vit-D Deficiency, Fractures)   HYPERLIPIDEMIA    Encourage heart healthy diet such as MIND or DASH  diet, increase exercise, avoid trans fats, simple carbohydrates and processed foods, consider a krill or fish or flaxseed oil cap daily.   Ct abd reviewed --- + atherosclerosis aorta       Relevant Medications   losartan (COZAAR) 25 MG tablet   verapamil (CALAN-SR) 180 MG CR tablet   Essential hypertension    Well controlled, no changes to meds. Encouraged heart healthy diet such as the DASH diet and exercise as tolerated.        Relevant Medications   losartan (COZAAR) 25 MG tablet   verapamil (CALAN-SR) 180 MG CR tablet   Other Relevant Orders   Comprehensive metabolic panel   Lipid panel   Other Visit Diagnoses     Primary hypertension       Relevant Medications   losartan (COZAAR) 25 MG tablet   verapamil (CALAN-SR) 180 MG CR tablet    Hyperlipidemia, unspecified hyperlipidemia type       Relevant Medications   losartan (COZAAR) 25 MG tablet   verapamil (CALAN-SR) 180 MG CR tablet   Other Relevant Orders   Lipoprotein A (LPA)     Assessment and Plan    Herpes Zoster (Shingles): Recent onset in early June, affecting the side and back. Completed a 7-day course of Valtrex. Still experiencing residual pain and minor leakage. -Consider Shingles vaccine in a few months after the current episode resolves.  Hyperlipidemia: Follow-up for cholesterol control. Patient is currently on fish oil. -Check cholesterol levels today and discuss results at next visit.  Hypertension: Follow-up for blood pressure control. No changes or concerns discussed. -Continue current management.  Cardiovascular Risk: Family history of heart disease, including a brother with a quadruple bypass in his 60s. Patient is not currently on cholesterol medication or aspirin. -Discuss potential for cardiology referral or calcium score CT scan after reviewing cholesterol results.  General Health Maintenance: -Schedule physical in six months.        Return in about 6 months (around 08/07/2023), or if symptoms worsen or fail to improve, for annual exam, fasting.    Donato Schultz, DO

## 2023-02-05 NOTE — Assessment & Plan Note (Signed)
Encourage heart healthy diet such as MIND or DASH diet, increase exercise, avoid trans fats, simple carbohydrates and processed foods, consider a krill or fish or flaxseed oil cap daily.   Ct abd reviewed --- + atherosclerosis aorta

## 2023-02-08 LAB — LIPOPROTEIN A (LPA): Lipoprotein (a): 19 nmol/L (ref <75)

## 2023-02-28 ENCOUNTER — Encounter: Payer: Self-pay | Admitting: Family Medicine

## 2023-03-05 NOTE — Telephone Encounter (Signed)
Does anything need to be done regarding the trace pericardial effusion?

## 2023-04-02 LAB — HM MAMMOGRAPHY

## 2023-04-27 ENCOUNTER — Encounter: Payer: Self-pay | Admitting: Family Medicine

## 2023-04-27 ENCOUNTER — Ambulatory Visit (INDEPENDENT_AMBULATORY_CARE_PROVIDER_SITE_OTHER): Payer: Medicare Other | Admitting: Family Medicine

## 2023-04-27 VITALS — BP 120/82 | HR 77 | Temp 98.0°F | Resp 18 | Ht 63.0 in | Wt 160.2 lb

## 2023-04-27 DIAGNOSIS — J111 Influenza due to unidentified influenza virus with other respiratory manifestations: Secondary | ICD-10-CM | POA: Diagnosis not present

## 2023-04-27 DIAGNOSIS — K611 Rectal abscess: Secondary | ICD-10-CM | POA: Diagnosis not present

## 2023-04-27 DIAGNOSIS — Z23 Encounter for immunization: Secondary | ICD-10-CM | POA: Diagnosis not present

## 2023-04-27 IMAGING — US US ABDOMEN COMPLETE
1 series · 14 of 25 positions shown · non-contrast
Comparison: Ultrasound November 08, 2017

CLINICAL DATA: Left upper quadrant abdominal pain.

EXAM:
ABDOMEN ULTRASOUND COMPLETE

[Series 1: us abdomen complete · 14 of 65 slices shown]
[im 1/65]
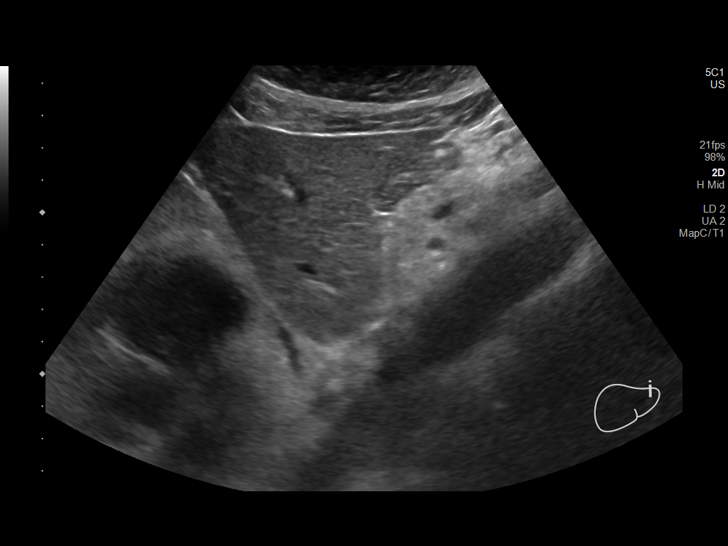
[im 6/65]
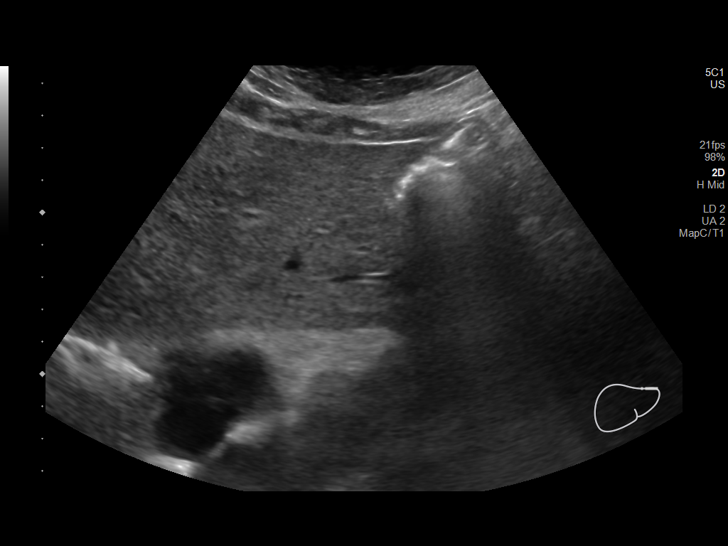
[im 11/65]
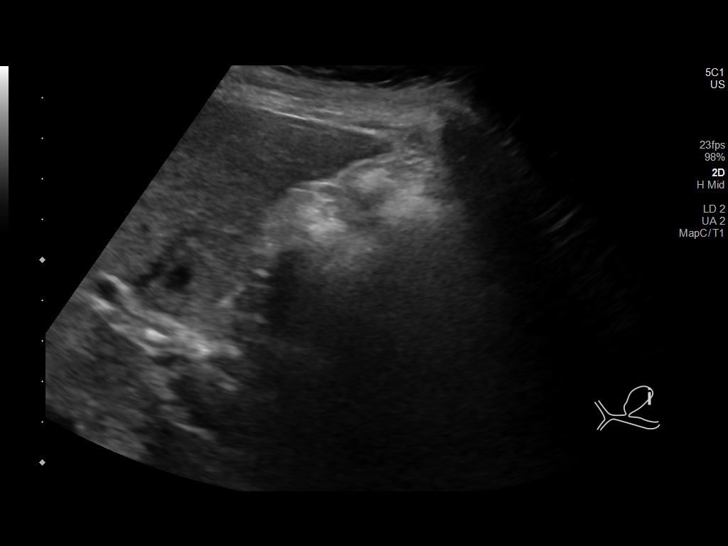
[im 17/65]
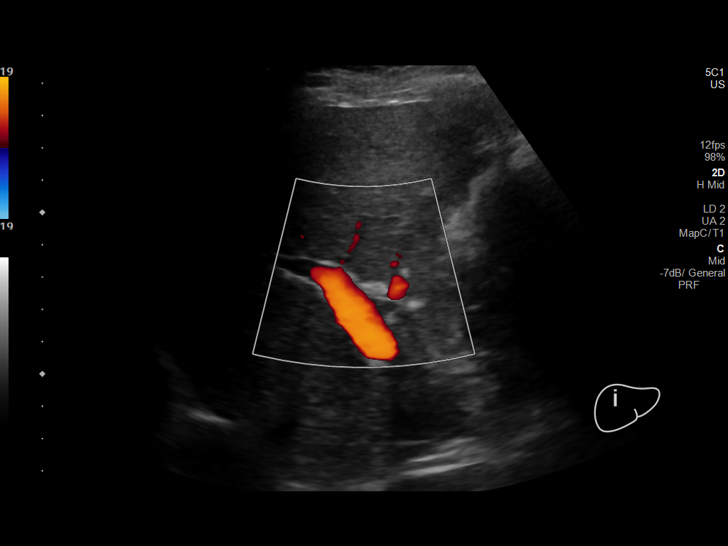
[im 22/65]
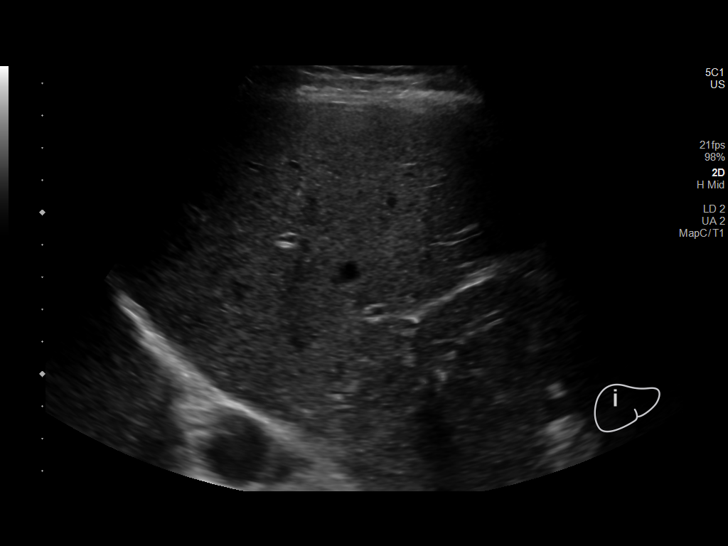
[im 25/65]
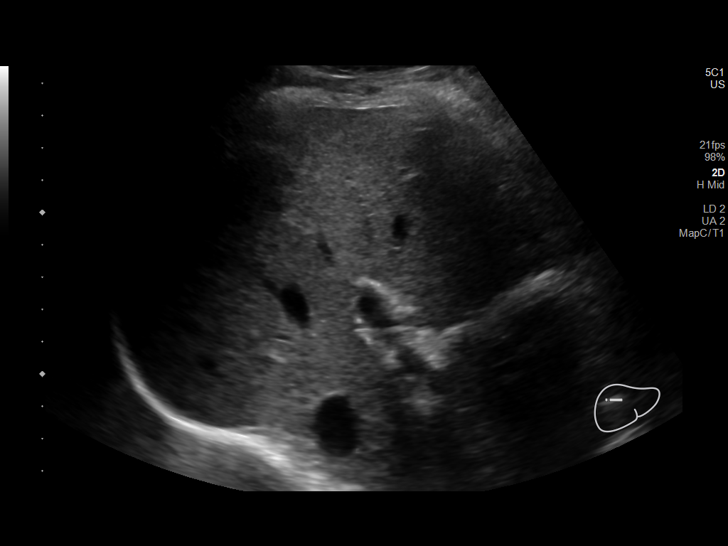
[im 30/65]
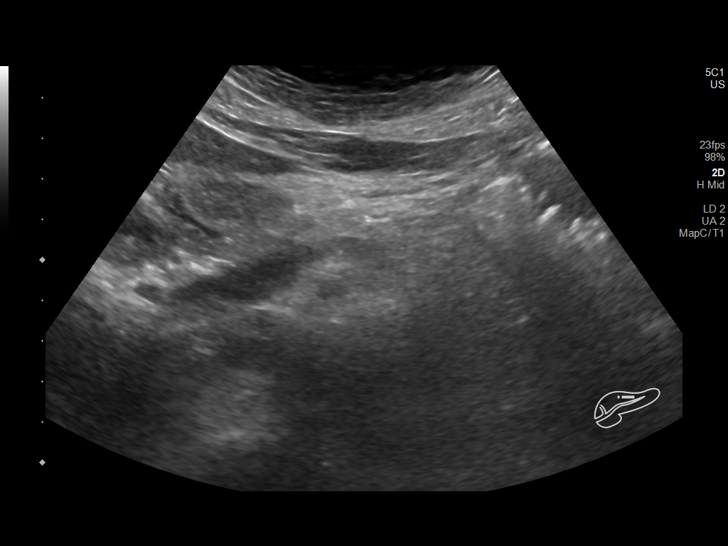
[im 35/65]
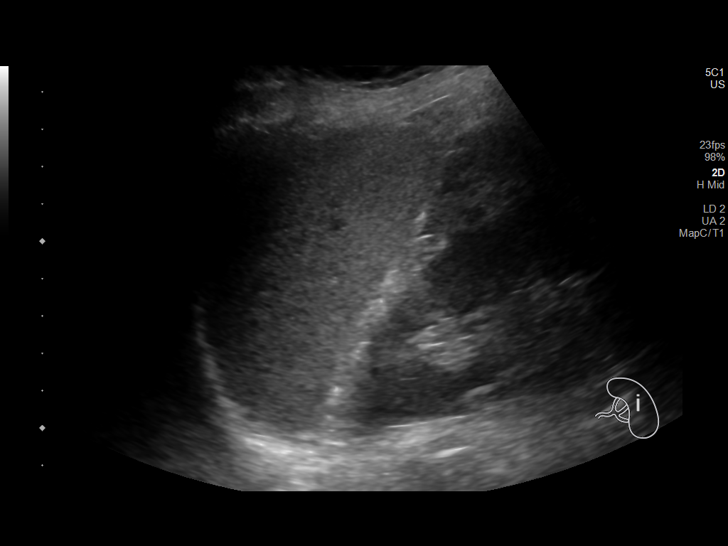
[im 41/65]
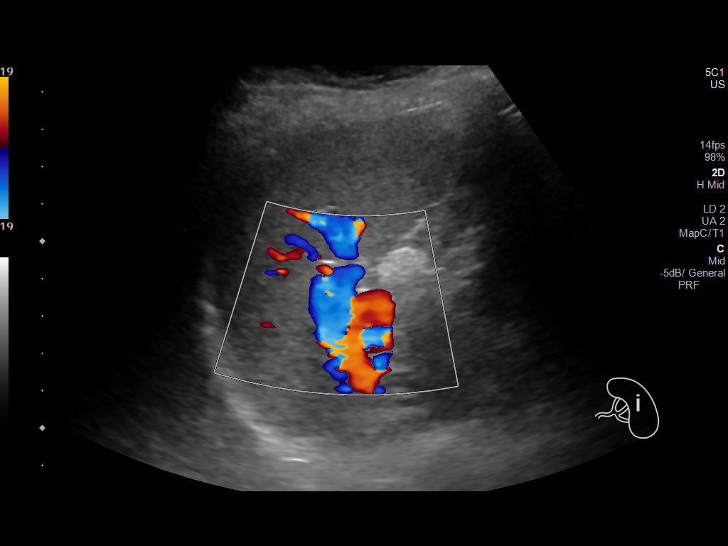
[im 43/65]
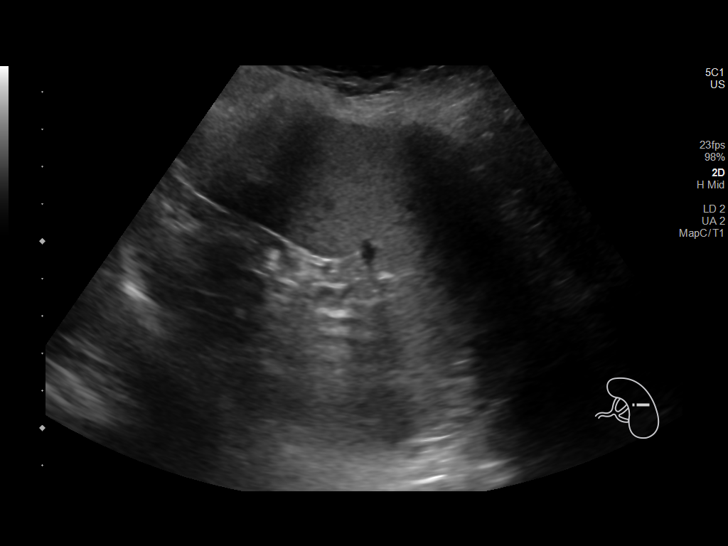
[im 49/65]
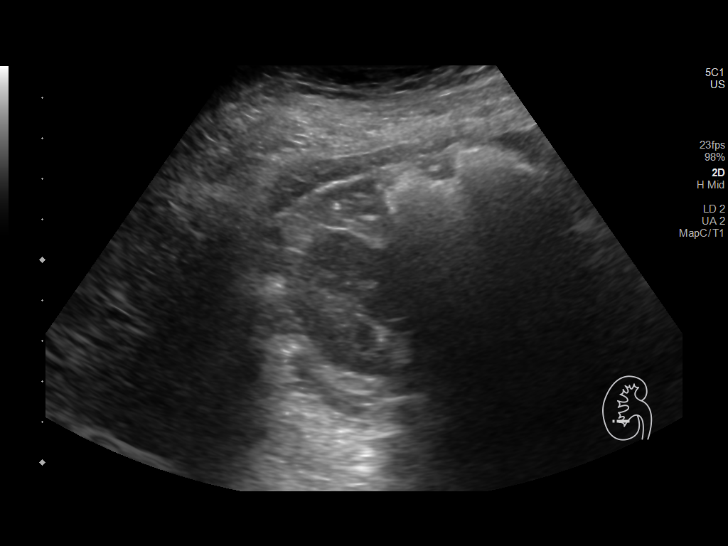
[im 54/65]
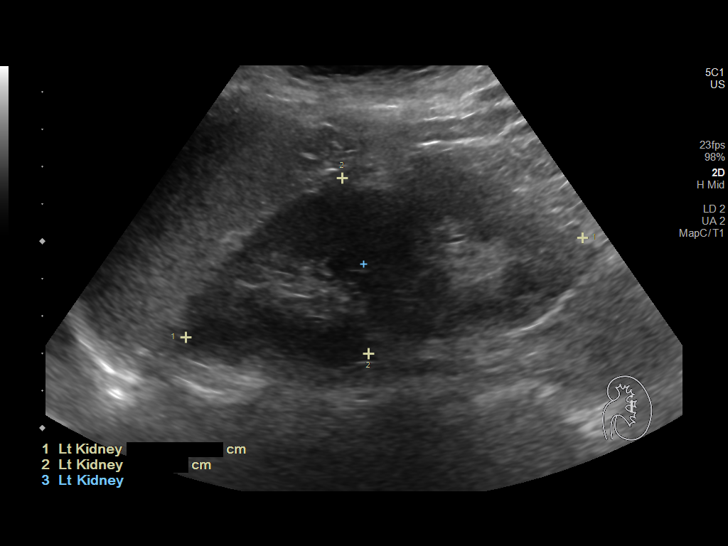
[im 59/65]
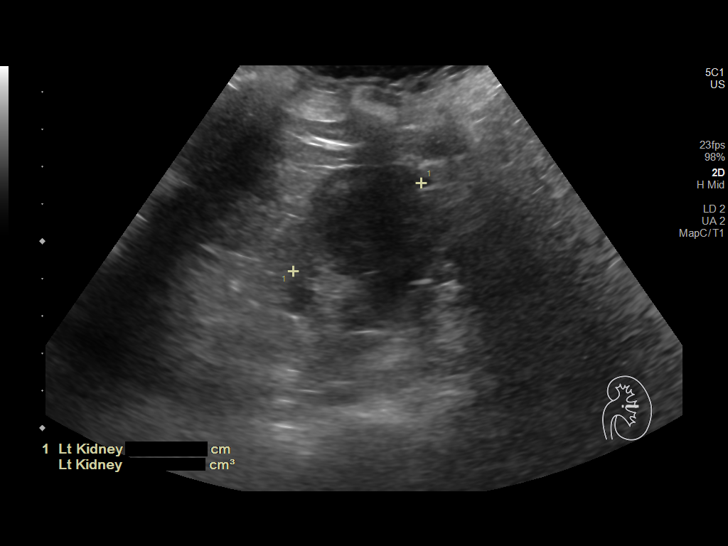
[im 65/65]
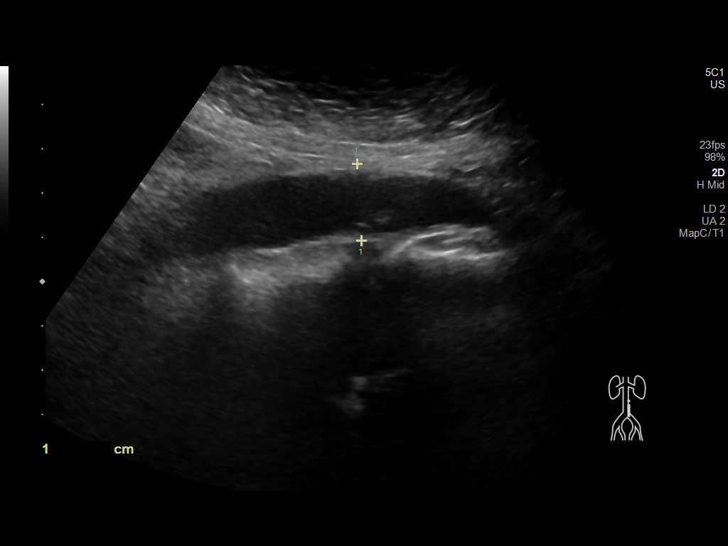

[14 of 25 positions shown; findings below may reference images not displayed]

FINDINGS: Gallbladder: Surgically absent.

Common bile duct: Diameter: 6.5 mm

Liver: No focal lesion identified. Within normal limits in
parenchymal echogenicity. Portal vein is patent on color Doppler
imaging with normal direction of blood flow towards the liver.

IVC: No abnormality visualized.

Pancreas: Visualized portion unremarkable.

Spleen: Size and appearance within normal limits.

Right Kidney: Length: 11.3 cm. Echogenicity within normal limits. No
mass or hydronephrosis visualized.

Left Kidney: Length: 11 cm. Echogenicity within normal limits. No
mass or hydronephrosis visualized.

Abdominal aorta: No aneurysm visualized.

Other findings: None.
IMPRESSION: Unremarkable post cholecystectomy abdominal ultrasound.

## 2023-04-27 MED ORDER — DOXYCYCLINE HYCLATE 100 MG PO TABS: 100.0000 mg | ORAL_TABLET | Freq: Two times a day (BID) | ORAL | 0 refills | Status: DC

## 2023-04-27 NOTE — Progress Notes (Signed)
Established Patient Office Visit  Subjective   Patient ID: EMAROSA KALT, female    DOB: Jan 16, 1956  Age: 67 y.o. MRN: [id number]  Chief Complaint  Patient presents with   Skin Problem    Pt states noticing the boil on Tuesday. Pt states boil has ruptured and states having discharge and bleeding.      HPI Discussed the use of AI scribe software for clinical note transcription with the patient, who gave verbal consent to proceed.  History of Present Illness   The patient presents with a skin lesion on her buttock, near the rectum but not within it. She describes it as an 'embarrassing place' and notes that it has been draining. The lesion started as a small, red area from which a piece came off. It then seemed to open up, and the patient attempted to manage it with Vaseline and cotton balls. However, after applying Clobetasol, the lesion seemed to become 'angry' and increased in size, which alarmed the patient. She has been soaking it in a tub for the past few nights, which she believes has helped reduce the size of the lesion.  In addition to the buttock lesion, the patient also reports a skin condition on her hand, which she believes to be caritosis. She describes it as developing 'little horns' and being surrounded by 'little scabies.' She has not seen a dermatologist for this issue yet.  She requests a one-time dose of Xanax to help manage this anxiety.      Patient Active Problem List   Diagnosis Date Noted   Need for tetanus booster 07/31/2022   Left upper quadrant abdominal pain 06/02/2021   Preventative health care 11/09/2020   Snoring 08/02/2020   Vitamin D deficiency 08/02/2020   Stress at work 04/19/2020   Numbness and tingling in left hand 04/24/2019   Chest pain 04/24/2019   RUQ pain 11/08/2017   Dizziness and giddiness 05/17/2015   Essential hypertension 04/05/2010   ARTHRALGIA 04/05/2010   SKIN CANCER, HX OF 09/08/2008   HYPERLIPIDEMIA 07/29/2007   Past  Medical History:  Diagnosis Date   Basal cell cancer     X 2;Dr Danella Deis   Complication of anesthesia    longer to come out of anesthesia   History of migraine headaches    Hyperlipidemia    Hypertension    Past Surgical History:  Procedure Laterality Date   BASAL CELL CARCINOMA EXCISION      X 2   CESAREAN SECTION      X 2; G 2 P 2   CHOLECYSTECTOMY N/A 01/09/2018   Procedure: LAPAROSCOPIC CHOLECYSTECTOMY;  Surgeon: Berna Bue, MD;  Location: [facility];  Service: General;  Laterality: N/A;   COLONOSCOPY  2012   Dr Juanda Chance, negative   dilation and curretage     Dr Seymour Bars   EXCISION MASS UPPER EXTREMETIES Left 03/03/2022   Procedure: EXCISION OF LEFT SHOULDER SUBCUTANEOUS MASS;  Surgeon: Berna Bue, MD;  Location: [facility];  Service: General;  Laterality: Left;   WISDOM TOOTH EXTRACTION     Social History   Tobacco Use   Smoking status: Never   Smokeless tobacco: Never  Vaping Use   Vaping status: Never Used  Substance Use Topics   Alcohol use: Yes    Comment:  rarely   Drug use: No   Social History   Socioeconomic History   Marital status: Married    Spouse name: Not on file   Number of children: Not on  file   Years of education: Not on file   Highest education level: 12th grade  Occupational History   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  Tobacco Use   Smoking status: Never   Smokeless tobacco: Never  Vaping Use   Vaping status: Never Used  Substance and Sexual Activity   Alcohol use: Yes    Comment:  rarely   Drug use: No   Sexual activity: Yes  Other Topics Concern   Not on file  Social History Narrative   Exercise-- walking some days    Social Determinants of Health   Financial Resource Strain: Low Risk  (02/02/2023)   Overall Financial Resource Strain (CARDIA)    Difficulty of Paying Living Expenses: Not hard at all  Food Insecurity: No Food Insecurity (02/02/2023)   Hunger Vital Sign    Worried  About Running Out of Food in the Last Year: Never true    Ran Out of Food in the Last Year: Never true  Transportation Needs: No Transportation Needs (02/02/2023)   PRAPARE - Administrator, Civil Service (Medical): No    Lack of Transportation (Non-Medical): No  Physical Activity: Inactive (02/02/2023)   Exercise Vital Sign    Days of Exercise per Week: 0 days    Minutes of Exercise per Session: 20 min  Stress: No Stress Concern Present (02/02/2023)   [employer] of Occupational Health - Occupational Stress Questionnaire    Feeling of Stress : Only a little  Social Connections: Moderately Integrated (02/02/2023)   Social Connection and Isolation Panel [NHANES]    Frequency of Communication with Friends and Family: More than three times a week    Frequency of Social Gatherings with Friends and Family: Three times a week    Attends Religious Services: 1 to 4 times per year    Active Member of Clubs or Organizations: No    Attends Banker Meetings: Never    Marital Status: Married  Catering manager Violence: Not At Risk (08/02/2022)   Humiliation, Afraid, Rape, and Kick questionnaire    Fear of Current or Ex-Partner: No    Emotionally Abused: No    Physically Abused: No    Sexually Abused: No   Family Status  Relation Name Status   Mother  (Not Specified)   Sister  (Not Specified)   Brother  (Not Specified)   MGM  (Not Specified)   Brother  Deceased at age 3       mi   Neg Hx  (Not Specified)  No partnership data on file   Family History  Problem Relation Age of Onset   Cancer Mother        ovarian cancer   Stroke Sister 39   Colon polyps Sister    Hypertension Sister    Stroke Brother 23   Hypertension Brother        X 3   Cancer Maternal Grandmother        bladder cancer   Heart attack Brother 66   Diabetes Neg Hx    Colon cancer Neg Hx    Esophageal cancer Neg Hx    Stomach cancer Neg Hx    Rectal cancer Neg Hx    Allergies   Allergen Reactions   Lisinopril     cough      Review of Systems  Constitutional:  Negative for chills, fever and malaise/fatigue.  HENT:  Negative for congestion and  hearing loss.   Eyes:  Negative for blurred vision and discharge.  Respiratory:  Negative for cough, sputum production and shortness of breath.   Cardiovascular:  Negative for chest pain, palpitations and leg swelling.  Gastrointestinal:  Negative for abdominal pain, blood in stool, constipation, diarrhea, heartburn, nausea and vomiting.  Genitourinary:  Negative for dysuria, frequency, hematuria and urgency.  Musculoskeletal:  Negative for back pain, falls and myalgias.  Skin:  Negative for rash.  Neurological:  Negative for dizziness, sensory change, loss of consciousness, weakness and headaches.  Endo/Heme/Allergies:  Negative for environmental allergies. Does not bruise/bleed easily.  Psychiatric/Behavioral:  Negative for depression and suicidal ideas. The patient is not nervous/anxious and does not have insomnia.       Objective:     BP 120/82 (BP Location: Left Arm, Patient Position: Sitting, Cuff Size: Normal)   Pulse 77   Temp 98 F (36.7 C) (Oral)   Resp 18   Ht 5\' 3"  (1.6 m)   Wt 160 lb 3.2 oz (72.7 kg)   SpO2 99%   BMI 28.38 kg/m  BP Readings from Last 3 Encounters:  04/27/23 120/82  02/05/23 110/80  01/25/23 125/84   Wt Readings from Last 3 Encounters:  04/27/23 160 lb 3.2 oz (72.7 kg)  02/05/23 156 lb 6.4 oz (70.9 kg)  01/25/23 155 lb (70.3 kg)   SpO2 Readings from Last 3 Encounters:  04/27/23 99%  02/05/23 98%  01/25/23 96%      Physical Exam Vitals and nursing note reviewed.  Constitutional:      General: She is not in acute distress.    Appearance: Normal appearance. She is well-developed.  HENT:     Head: Normocephalic and atraumatic.  Eyes:     General: No scleral icterus.       Right eye: No discharge.        Left eye: No discharge.  Cardiovascular:     Rate and  Rhythm: Normal rate and regular rhythm.     Heart sounds: No murmur heard. Pulmonary:     Effort: Pulmonary effort is normal. No respiratory distress.     Breath sounds: Normal breath sounds.  Genitourinary:      Comments: + perirectal abscess---  culture done  Tender to touch Musculoskeletal:        General: Normal range of motion.     Cervical back: Normal range of motion and neck supple.     Right lower leg: No edema.     Left lower leg: No edema.  Skin:    General: Skin is warm and dry.  Neurological:     Mental Status: She is alert and oriented to person, place, and time.  Psychiatric:        Mood and Affect: Mood normal.        Behavior: Behavior normal.        Thought Content: Thought content normal.        Judgment: Judgment normal.      No results found for any visits on 04/27/23.    The 10-year ASCVD risk score (Arnett DK, et al., 2019) is: 7.8%    Assessment & Plan:   Problem List Items Addressed This Visit   None Visit Diagnoses     Perirectal abscess    -  Primary   Relevant Medications   doxycycline (VIBRA-TABS) 100 MG tablet   Other Relevant Orders   Wound culture   Influenza       Need for influenza vaccination  Relevant Orders   Flu Vaccine Trivalent High Dose (Fluad) (Completed)     Assessment and Plan    Perianal Abscess Draining, painful lesion near the rectum. Clobetasol application seemed to exacerbate the condition. Culture taken for further analysis. -Start antibiotics. -Advise sitz baths with Epsom salts to promote drainage. -If no improvement, consider referral to a surgeon.  Suspicious Skin Lesion on Hand Lesion with "horns" and "scabies" around it, possibly a basal cell carcinoma. -Refer to dermatologist (Dr. Vevelyn Royals) for evaluation and possible biopsy.  General Health Maintenance -Administer influenza vaccine today.        No follow-ups on file.    Donato Schultz, DO

## 2023-04-30 LAB — WOUND CULTURE
MICRO NUMBER:: 15431866
SPECIMEN QUALITY:: ADEQUATE

## 2023-05-02 ENCOUNTER — Telehealth: Payer: Self-pay | Admitting: Family Medicine

## 2023-05-02 MED ORDER — AMOXICILLIN-POT CLAVULANATE 875-125 MG PO TABS: 1.0000 | ORAL_TABLET | Freq: Two times a day (BID) | ORAL | 0 refills | Status: AC

## 2023-05-02 NOTE — Telephone Encounter (Signed)
Pt called back and labs reviewed pt stated she feels better , but still has redness around the wound .Marland Kitchen Pt wants to know if she starts the Augmentin is she to stop the doxycycline

## 2023-05-02 NOTE — Telephone Encounter (Signed)
Pt would like to discuss recent lab results with nurse. Please call and advise. She prefers to discuss after lunch time today.

## 2023-05-02 NOTE — Addendum Note (Signed)
Addended by: Maximino Sarin on: 05/02/2023 11:16 AM   Modules accepted: Orders

## 2023-05-02 NOTE — Telephone Encounter (Signed)
Pt called and unable to lvm , will send mychart message  Medication sent in

## 2023-07-09 ENCOUNTER — Ambulatory Visit: Payer: Medicare Other | Admitting: Family Medicine

## 2023-07-10 ENCOUNTER — Other Ambulatory Visit: Payer: Self-pay | Admitting: Family Medicine

## 2023-07-10 DIAGNOSIS — I10 Essential (primary) hypertension: Secondary | ICD-10-CM

## 2023-07-13 ENCOUNTER — Other Ambulatory Visit (HOSPITAL_BASED_OUTPATIENT_CLINIC_OR_DEPARTMENT_OTHER): Payer: Self-pay

## 2023-07-13 ENCOUNTER — Encounter: Payer: Self-pay | Admitting: Family Medicine

## 2023-07-13 ENCOUNTER — Ambulatory Visit (INDEPENDENT_AMBULATORY_CARE_PROVIDER_SITE_OTHER): Payer: Medicare Other | Admitting: Family Medicine

## 2023-07-13 VITALS — BP 110/80 | HR 67 | Temp 97.7°F | Resp 18 | Ht 63.0 in | Wt 165.6 lb

## 2023-07-13 DIAGNOSIS — I10 Essential (primary) hypertension: Secondary | ICD-10-CM | POA: Diagnosis not present

## 2023-07-13 DIAGNOSIS — E785 Hyperlipidemia, unspecified: Secondary | ICD-10-CM | POA: Diagnosis not present

## 2023-07-13 DIAGNOSIS — E559 Vitamin D deficiency, unspecified: Secondary | ICD-10-CM

## 2023-07-13 DIAGNOSIS — E782 Mixed hyperlipidemia: Secondary | ICD-10-CM

## 2023-07-13 LAB — COMPREHENSIVE METABOLIC PANEL
ALT: 13 U/L (ref 0–35)
AST: 15 U/L (ref 0–37)
Albumin: 4 g/dL (ref 3.5–5.2)
Alkaline Phosphatase: 89 U/L (ref 39–117)
BUN: 16 mg/dL (ref 6–23)
CO2: 29 meq/L (ref 19–32)
Calcium: 9.1 mg/dL (ref 8.4–10.5)
Chloride: 103 meq/L (ref 96–112)
Creatinine, Ser: 0.69 mg/dL (ref 0.40–1.20)
GFR: 89.8 mL/min (ref >60.00)
Glucose, Bld: 84 mg/dL (ref 70–99)
Potassium: 4.2 meq/L (ref 3.5–5.1)
Sodium: 140 meq/L (ref 135–145)
Total Bilirubin: 0.5 mg/dL (ref 0.2–1.2)
Total Protein: 6.7 g/dL (ref 6.0–8.3)

## 2023-07-13 LAB — LIPID PANEL
Cholesterol: 290 mg/dL — ABNORMAL HIGH (ref 0–200)
HDL: 84.7 mg/dL (ref >39.00)
LDL Cholesterol: 186 mg/dL — ABNORMAL HIGH (ref 0–99)
NonHDL: 205.78
Total CHOL/HDL Ratio: 3
Triglycerides: 99 mg/dL (ref 0.0–149.0)
VLDL: 19.8 mg/dL (ref 0.0–40.0)

## 2023-07-13 LAB — CBC WITH DIFFERENTIAL/PLATELET
Basophils Absolute: 0 10*3/uL (ref 0.0–0.1)
Basophils Relative: 0.2 % (ref 0.0–3.0)
Eosinophils Absolute: 0.2 10*3/uL (ref 0.0–0.7)
Eosinophils Relative: 3.8 % (ref 0.0–5.0)
HCT: 43.2 % (ref 36.0–46.0)
Hemoglobin: 14.3 g/dL (ref 12.0–15.0)
Lymphocytes Relative: 36.1 % (ref 12.0–46.0)
Lymphs Abs: 1.9 10*3/uL (ref 0.7–4.0)
MCHC: 33 g/dL (ref 30.0–36.0)
MCV: 97.9 fL (ref 78.0–100.0)
Monocytes Absolute: 0.4 10*3/uL (ref 0.1–1.0)
Monocytes Relative: 7.2 % (ref 3.0–12.0)
Neutro Abs: 2.7 10*3/uL (ref 1.4–7.7)
Neutrophils Relative %: 52.7 % (ref 43.0–77.0)
Platelets: 281 10*3/uL (ref 150.0–400.0)
RBC: 4.42 Mil/uL (ref 3.87–5.11)
RDW: 12.6 % (ref 11.5–15.5)
WBC: 5.1 10*3/uL (ref 4.0–10.5)

## 2023-07-13 LAB — VITAMIN B12: Vitamin B-12: 283 pg/mL (ref 211–911)

## 2023-07-13 LAB — VITAMIN D 25 HYDROXY (VIT D DEFICIENCY, FRACTURES): VITD: 28.89 ng/mL — ABNORMAL LOW (ref 30.00–100.00)

## 2023-07-13 LAB — TSH: TSH: 2.48 u[IU]/mL (ref 0.35–5.50)

## 2023-07-13 MED ORDER — SHINGRIX 50 MCG/0.5ML IM SUSR
INTRAMUSCULAR | 1 refills | Status: DC
  Filled 2023-07-13: qty 0.5, 1d supply, fill #0
  Filled 2023-10-30: qty 0.5, 1d supply, fill #1

## 2023-07-13 MED ORDER — BOOSTRIX 5-2.5-18.5 LF-MCG/0.5 IM SUSY
PREFILLED_SYRINGE | INTRAMUSCULAR | 0 refills | Status: DC
  Filled 2023-07-13: qty 0.5, 1d supply, fill #0

## 2023-07-13 MED ORDER — LOSARTAN POTASSIUM 25 MG PO TABS: ORAL_TABLET | ORAL | 1 refills | Status: DC

## 2023-07-13 NOTE — Assessment & Plan Note (Signed)
Well controlled, no changes to meds. Encouraged heart healthy diet such as the DASH diet and exercise as tolerated.  °

## 2023-07-13 NOTE — Progress Notes (Signed)
Established Patient Office Visit  Subjective   Patient ID: Megan Murphy, female    DOB: Sep 15, 1955  Age: 67 y.o. MRN: [id number]  Chief Complaint  Patient presents with   Hypertension   Follow-up    HPI Discussed the use of AI scribe software for clinical note transcription with the patient, who gave verbal consent to proceed.  History of Present Illness   The patient, with a history of hypertension, presented for a routine follow-up visit. She reported no recent health issues, with no complaints of swelling, shortness of breath, or chest pains. She has been maintaining an active lifestyle, including yard work.  She reported adherence to this regimen with no new medications added.  The patient had a previous abscess, which has since resolved without further complications.   The patient's cholesterol was borderline in the last assessment in June, and she has been taking fish oil capsules and trying to increase her exercise in response. She also reported a previous prescription for Vitamin D2, which she felt improved her wellbeing.  The patient is due for a tetanus vaccine and a shingles vaccine, which she plans to receive at the pharmacy.      Patient Active Problem List   Diagnosis Date Noted   Need for tetanus booster 07/31/2022   Left upper quadrant abdominal pain 06/02/2021   Preventative health care 11/09/2020   Snoring 08/02/2020   Vitamin D deficiency 08/02/2020   Stress at work 04/19/2020   Numbness and tingling in left hand 04/24/2019   Chest pain 04/24/2019   RUQ pain 11/08/2017   Dizziness and giddiness 05/17/2015   Essential hypertension 04/05/2010   ARTHRALGIA 04/05/2010   SKIN CANCER, HX OF 09/08/2008   HYPERLIPIDEMIA 07/29/2007   Past Medical History:  Diagnosis Date   Basal cell cancer     X 2;Dr Danella Deis   Complication of anesthesia    longer to come out of anesthesia   History of migraine headaches    Hyperlipidemia    Hypertension    Past  Surgical History:  Procedure Laterality Date   BASAL CELL CARCINOMA EXCISION      X 2   CESAREAN SECTION      X 2; G 2 P 2   CHOLECYSTECTOMY N/A 01/09/2018   Procedure: LAPAROSCOPIC CHOLECYSTECTOMY;  Surgeon: Berna Bue, MD;  Location: [facility];  Service: General;  Laterality: N/A;   COLONOSCOPY  2012   Dr Juanda Chance, negative   dilation and curretage     Dr Seymour Bars   EXCISION MASS UPPER EXTREMETIES Left 03/03/2022   Procedure: EXCISION OF LEFT SHOULDER SUBCUTANEOUS MASS;  Surgeon: Berna Bue, MD;  Location: [facility];  Service: General;  Laterality: Left;   WISDOM TOOTH EXTRACTION     Social History   Tobacco Use   Smoking status: Never   Smokeless tobacco: Never  Vaping Use   Vaping status: Never Used  Substance Use Topics   Alcohol use: Yes    Comment:  rarely   Drug use: No   Social History   Socioeconomic History   Marital status: Married    Spouse name: Not on file   Number of children: Not on file   Years of education: Not on file   Highest education level: 12th grade  Occupational History   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  Tobacco Use   Smoking status: Never   Smokeless tobacco: Never  Advertising account planner  Vaping status: Never Used  Substance and Sexual Activity   Alcohol use: Yes    Comment:  rarely   Drug use: No   Sexual activity: Yes  Other Topics Concern   Not on file  Social History Narrative   Exercise-- walking some days    Social Determinants of Health   Financial Resource Strain: Low Risk  (07/10/2023)   Overall Financial Resource Strain (CARDIA)    Difficulty of Paying Living Expenses: Not hard at all  Food Insecurity: No Food Insecurity (07/10/2023)   Hunger Vital Sign    Worried About Running Out of Food in the Last Year: Never true    Ran Out of Food in the Last Year: Never true  Transportation Needs: No Transportation Needs (07/10/2023)   PRAPARE - Administrator, Civil Service  (Medical): No    Lack of Transportation (Non-Medical): No  Physical Activity: Insufficiently Active (07/10/2023)   Exercise Vital Sign    Days of Exercise per Week: 2 days    Minutes of Exercise per Session: 20 min  Stress: No Stress Concern Present (07/10/2023)   [employer] of Occupational Health - Occupational Stress Questionnaire    Feeling of Stress : Only a little  Social Connections: Moderately Isolated (07/10/2023)   Social Connection and Isolation Panel [NHANES]    Frequency of Communication with Friends and Family: More than three times a week    Frequency of Social Gatherings with Friends and Family: Twice a week    Attends Religious Services: Never    Database administrator or Organizations: No    Attends Banker Meetings: Never    Marital Status: Married  Catering manager Violence: Not At Risk (08/02/2022)   Humiliation, Afraid, Rape, and Kick questionnaire    Fear of Current or Ex-Partner: No    Emotionally Abused: No    Physically Abused: No    Sexually Abused: No   Family Status  Relation Name Status   Mother  (Not Specified)   Sister  (Not Specified)   Brother  (Not Specified)   MGM  (Not Specified)   Brother  Deceased at age 45       mi   Neg Hx  (Not Specified)  No partnership data on file   Family History  Problem Relation Age of Onset   Cancer Mother        ovarian cancer   Stroke Sister 31   Colon polyps Sister    Hypertension Sister    Stroke Brother 31   Hypertension Brother        X 3   Cancer Maternal Grandmother        bladder cancer   Heart attack Brother 60   Diabetes Neg Hx    Colon cancer Neg Hx    Esophageal cancer Neg Hx    Stomach cancer Neg Hx    Rectal cancer Neg Hx    Allergies  Allergen Reactions   Lisinopril     cough      Review of Systems  Constitutional:  Negative for chills, fever and malaise/fatigue.  HENT:  Negative for congestion and hearing loss.   Eyes:  Negative for blurred vision and  discharge.  Respiratory:  Negative for cough, sputum production and shortness of breath.   Cardiovascular:  Negative for chest pain, palpitations and leg swelling.  Gastrointestinal:  Negative for abdominal pain, blood in stool, constipation, diarrhea, heartburn, nausea and vomiting.  Genitourinary:  Negative for dysuria, frequency,  hematuria and urgency.  Musculoskeletal:  Negative for back pain, falls and myalgias.  Skin:  Negative for rash.  Neurological:  Negative for dizziness, sensory change, loss of consciousness, weakness and headaches.  Endo/Heme/Allergies:  Negative for environmental allergies. Does not bruise/bleed easily.  Psychiatric/Behavioral:  Negative for depression and suicidal ideas. The patient is not nervous/anxious and does not have insomnia.       Objective:     BP 110/80 (BP Location: Left Arm, Patient Position: Sitting, Cuff Size: Normal)   Pulse 67   Temp 97.7 F (36.5 C) (Oral)   Resp 18   Ht 5\' 3"  (1.6 m)   Wt 165 lb 9.6 oz (75.1 kg)   SpO2 97%   BMI 29.33 kg/m  BP Readings from Last 3 Encounters:  07/13/23 110/80  04/27/23 120/82  02/05/23 110/80   Wt Readings from Last 3 Encounters:  07/13/23 165 lb 9.6 oz (75.1 kg)  04/27/23 160 lb 3.2 oz (72.7 kg)  02/05/23 156 lb 6.4 oz (70.9 kg)   SpO2 Readings from Last 3 Encounters:  07/13/23 97%  04/27/23 99%  02/05/23 98%      Physical Exam Vitals and nursing note reviewed.  Constitutional:      General: She is not in acute distress.    Appearance: Normal appearance. She is well-developed.  HENT:     Head: Normocephalic and atraumatic.     Right Ear: Tympanic membrane, ear canal and external ear normal. There is no impacted cerumen.     Left Ear: Tympanic membrane, ear canal and external ear normal. There is no impacted cerumen.     Nose: Nose normal.     Mouth/Throat:     Mouth: Mucous membranes are moist.     Pharynx: Oropharynx is clear. No oropharyngeal exudate or posterior oropharyngeal  erythema.  Eyes:     General: No scleral icterus.       Right eye: No discharge.        Left eye: No discharge.     Conjunctiva/sclera: Conjunctivae normal.     Pupils: Pupils are equal, round, and reactive to light.  Neck:     Thyroid: No thyromegaly or thyroid tenderness.     Vascular: No JVD.  Cardiovascular:     Rate and Rhythm: Normal rate and regular rhythm.     Heart sounds: Normal heart sounds. No murmur heard. Pulmonary:     Effort: Pulmonary effort is normal. No respiratory distress.     Breath sounds: Normal breath sounds.  Abdominal:     General: Bowel sounds are normal. There is no distension.     Palpations: Abdomen is soft. There is no mass.     Tenderness: There is no abdominal tenderness. There is no guarding or rebound.  Genitourinary:    Vagina: Normal.  Musculoskeletal:        General: Normal range of motion.     Cervical back: Normal range of motion and neck supple.     Right lower leg: No edema.     Left lower leg: No edema.  Lymphadenopathy:     Cervical: No cervical adenopathy.  Skin:    General: Skin is warm and dry.     Findings: No erythema or rash.  Neurological:     Mental Status: She is alert and oriented to person, place, and time.     Cranial Nerves: No cranial nerve deficit.     Deep Tendon Reflexes: Reflexes are normal and symmetric.  Psychiatric:  Mood and Affect: Mood normal.        Behavior: Behavior normal.        Thought Content: Thought content normal.        Judgment: Judgment normal.      No results found for any visits on 07/13/23.  Last CBC Lab Results  Component Value Date   WBC 4.7 12/20/2021   HGB 14.1 12/20/2021   HCT 42.2 12/20/2021   MCV 96.6 12/20/2021   MCH 32.4 01/03/2018   RDW 12.4 12/20/2021   PLT 265.0 12/20/2021   Last metabolic panel Lab Results  Component Value Date   GLUCOSE 90 02/05/2023   NA 140 02/05/2023   K 4.1 02/05/2023   CL 103 02/05/2023   CO2 29 02/05/2023   BUN 12 02/05/2023    CREATININE 0.70 02/05/2023   GFR 89.76 02/05/2023   CALCIUM 9.1 02/05/2023   PROT 7.1 02/05/2023   ALBUMIN 3.9 02/05/2023   BILITOT 0.5 02/05/2023   ALKPHOS 90 02/05/2023   AST 15 02/05/2023   ALT 16 02/05/2023   ANIONGAP 6 02/27/2022   Last lipids Lab Results  Component Value Date   CHOL 250 (H) 02/05/2023   HDL 84.10 02/05/2023   LDLCALC 142 (H) 02/05/2023   LDLDIRECT 134.3 03/13/2012   TRIG 121.0 02/05/2023   CHOLHDL 3 02/05/2023   Last hemoglobin A1c Lab Results  Component Value Date   HGBA1C 5.5 03/07/2011   Last thyroid functions Lab Results  Component Value Date   TSH 1.93 11/09/2020   Last vitamin D Lab Results  Component Value Date   VD25OH 33.24 02/05/2023   Last vitamin B12 and Folate Lab Results  Component Value Date   VITAMINB12 345 12/09/2015   FOLATE 8.0 09/08/2008      The 10-year ASCVD risk score (Arnett DK, et al., 2019) is: 6.6%    Assessment & Plan:   Problem List Items Addressed This Visit       Unprioritized   Vitamin D deficiency   Relevant Orders   VITAMIN D 25 Hydroxy (Vit-D Deficiency, Fractures)   HYPERLIPIDEMIA    The 10-year ASCVD risk score (Arnett DK, et al., 2019) is: 6.6%   Values used to calculate the score:     Age: 61 years     Sex: Female     Is Non-Hispanic African American: No     Diabetic: No     Tobacco smoker: No     Systolic Blood Pressure: 110 mmHg     Is BP treated: Yes     HDL Cholesterol: 84.1 mg/dL     Total Cholesterol: 250 mg/dL Encourage heart healthy diet such as MIND or DASH diet, increase exercise, avoid trans fats, simple carbohydrates and processed foods, consider a krill or fish or flaxseed oil cap daily.        Relevant Medications   losartan (COZAAR) 25 MG tablet   Essential hypertension - Primary    Well controlled, no changes to meds. Encouraged heart healthy diet such as the DASH diet and exercise as tolerated.        Relevant Medications   losartan (COZAAR) 25 MG tablet    Other Relevant Orders   CBC with Differential/Platelet   Vitamin B12   Other Visit Diagnoses     Primary hypertension       Relevant Medications   losartan (COZAAR) 25 MG tablet   Other Relevant Orders   TSH   Hyperlipidemia, unspecified hyperlipidemia type  Relevant Medications   losartan (COZAAR) 25 MG tablet   Other Relevant Orders   Comprehensive metabolic panel   Lipid panel     Assessment and Plan    General Health Maintenance During her routine health maintenance visit, her blood pressure was found to be well-controlled. We discussed the necessity of shingles and tetanus vaccinations and mentioned the potential for arm soreness following these vaccinations. Her cholesterol levels were borderline in June, presenting a 10-year risk of 6.6%. Additionally, her vitamin D levels were previously low. We will administer the shingles vaccine and the tetanus vaccine. A lipid panel and a vitamin D level will be ordered to monitor her cholesterol and vitamin D levels, respectively.  Hyperlipidemia Her cholesterol levels were borderline in June, with a 10-year risk of 6.6%. She has been taking fish oil and increasing her exercise. We discussed monitoring her cholesterol levels closely and the possibility of starting statin therapy if her 10-year risk exceeds 10%. A lipid panel will be ordered to assess her cholesterol levels.  Vitamin D Deficiency She reported improvement with prescribed vitamin D2, indicating previously low vitamin D levels. The importance of maintaining adequate vitamin D levels was discussed. A vitamin D level will be ordered to ensure her levels are within the desired range.  Resolved Abscess Her previous abscess resolved without complications. We discussed a case that required surgical intervention to emphasize the importance of timely treatment for such conditions.        No follow-ups on file.    Donato Schultz, DO

## 2023-07-13 NOTE — Assessment & Plan Note (Addendum)
The 10-year ASCVD risk score (Arnett DK, et al., 2019) is: 6.6%   Values used to calculate the score:     Age: 67 years     Sex: Female     Is Non-Hispanic African American: No     Diabetic: No     Tobacco smoker: No     Systolic Blood Pressure: 110 mmHg     Is BP treated: Yes     HDL Cholesterol: 84.1 mg/dL     Total Cholesterol: 250 mg/dL Encourage heart healthy diet such as MIND or DASH diet, increase exercise, avoid trans fats, simple carbohydrates and processed foods, consider a krill or fish or flaxseed oil cap daily.

## 2023-07-23 ENCOUNTER — Other Ambulatory Visit: Payer: Self-pay | Admitting: Family Medicine

## 2023-07-23 DIAGNOSIS — E785 Hyperlipidemia, unspecified: Secondary | ICD-10-CM

## 2023-07-24 ENCOUNTER — Encounter: Payer: Self-pay | Admitting: Family Medicine

## 2023-07-25 NOTE — Telephone Encounter (Signed)
Vit D not on patients list. Last vit d check was slightly out of range. Would you like rx refilled or advise for OTC vitamin D?

## 2023-07-27 MED ORDER — VITAMIN D (ERGOCALCIFEROL) 1.25 MG (50000 UNIT) PO CAPS: 50000.0000 [IU] | ORAL_CAPSULE | ORAL | 1 refills | Status: DC

## 2023-10-30 ENCOUNTER — Other Ambulatory Visit (HOSPITAL_BASED_OUTPATIENT_CLINIC_OR_DEPARTMENT_OTHER): Payer: Self-pay

## 2023-10-30 ENCOUNTER — Other Ambulatory Visit (INDEPENDENT_AMBULATORY_CARE_PROVIDER_SITE_OTHER)

## 2023-10-30 DIAGNOSIS — E785 Hyperlipidemia, unspecified: Secondary | ICD-10-CM

## 2023-10-30 LAB — COMPREHENSIVE METABOLIC PANEL
ALT: 13 U/L (ref 0–35)
AST: 13 U/L (ref 0–37)
Albumin: 3.9 g/dL (ref 3.5–5.2)
Alkaline Phosphatase: 87 U/L (ref 39–117)
BUN: 13 mg/dL (ref 6–23)
CO2: 30 meq/L (ref 19–32)
Calcium: 9.1 mg/dL (ref 8.4–10.5)
Chloride: 105 meq/L (ref 96–112)
Creatinine, Ser: 0.68 mg/dL (ref 0.40–1.20)
GFR: 89.93 mL/min (ref >60.00)
Glucose, Bld: 95 mg/dL (ref 70–99)
Potassium: 3.8 meq/L (ref 3.5–5.1)
Sodium: 142 meq/L (ref 135–145)
Total Bilirubin: 0.4 mg/dL (ref 0.2–1.2)
Total Protein: 6.6 g/dL (ref 6.0–8.3)

## 2023-10-30 LAB — LIPID PANEL
Cholesterol: 255 mg/dL — ABNORMAL HIGH (ref 0–200)
HDL: 87.4 mg/dL (ref >39.00)
LDL Cholesterol: 147 mg/dL — ABNORMAL HIGH (ref 0–99)
NonHDL: 167.52
Total CHOL/HDL Ratio: 3
Triglycerides: 105 mg/dL (ref 0.0–149.0)
VLDL: 21 mg/dL (ref 0.0–40.0)

## 2023-11-07 ENCOUNTER — Encounter: Payer: Self-pay | Admitting: Family Medicine

## 2023-12-26 ENCOUNTER — Other Ambulatory Visit: Payer: Self-pay

## 2023-12-26 DIAGNOSIS — I728 Aneurysm of other specified arteries: Secondary | ICD-10-CM

## 2023-12-28 ENCOUNTER — Other Ambulatory Visit: Payer: Self-pay | Admitting: Family Medicine

## 2023-12-28 DIAGNOSIS — I10 Essential (primary) hypertension: Secondary | ICD-10-CM

## 2024-01-03 ENCOUNTER — Other Ambulatory Visit: Payer: Self-pay | Admitting: Family Medicine

## 2024-01-08 ENCOUNTER — Ambulatory Visit (INDEPENDENT_AMBULATORY_CARE_PROVIDER_SITE_OTHER): Admitting: Family Medicine

## 2024-01-08 ENCOUNTER — Encounter: Payer: Self-pay | Admitting: Family Medicine

## 2024-01-08 ENCOUNTER — Ambulatory Visit (HOSPITAL_BASED_OUTPATIENT_CLINIC_OR_DEPARTMENT_OTHER)
Admission: RE | Admit: 2024-01-08 | Discharge: 2024-01-08 | Disposition: A | Discharge status: Home / Self Care | Source: Ambulatory Visit | Attending: Family Medicine | Admitting: Family Medicine

## 2024-01-08 VITALS — BP 112/80 | HR 73 | Temp 97.8°F | Resp 16 | Ht 63.0 in | Wt 166.8 lb

## 2024-01-08 DIAGNOSIS — R200 Anesthesia of skin: Secondary | ICD-10-CM | POA: Diagnosis present

## 2024-01-08 DIAGNOSIS — R202 Paresthesia of skin: Secondary | ICD-10-CM | POA: Insufficient documentation

## 2024-01-08 DIAGNOSIS — I10 Essential (primary) hypertension: Secondary | ICD-10-CM | POA: Diagnosis not present

## 2024-01-08 DIAGNOSIS — E785 Hyperlipidemia, unspecified: Secondary | ICD-10-CM | POA: Diagnosis not present

## 2024-01-08 DIAGNOSIS — M79602 Pain in left arm: Secondary | ICD-10-CM | POA: Diagnosis present

## 2024-01-08 DIAGNOSIS — E559 Vitamin D deficiency, unspecified: Secondary | ICD-10-CM

## 2024-01-08 DIAGNOSIS — E782 Mixed hyperlipidemia: Secondary | ICD-10-CM

## 2024-01-08 LAB — CBC WITH DIFFERENTIAL/PLATELET
Basophils Absolute: 0 10*3/uL (ref 0.0–0.1)
Basophils Relative: 0.4 % (ref 0.0–3.0)
Eosinophils Absolute: 0.1 10*3/uL (ref 0.0–0.7)
Eosinophils Relative: 2.6 % (ref 0.0–5.0)
HCT: 40.2 % (ref 36.0–46.0)
Hemoglobin: 13.4 g/dL (ref 12.0–15.0)
Lymphocytes Relative: 40.1 % (ref 12.0–46.0)
Lymphs Abs: 2.2 10*3/uL (ref 0.7–4.0)
MCHC: 33.4 g/dL (ref 30.0–36.0)
MCV: 95.6 fl (ref 78.0–100.0)
Monocytes Absolute: 0.5 10*3/uL (ref 0.1–1.0)
Monocytes Relative: 9.5 % (ref 3.0–12.0)
Neutro Abs: 2.6 10*3/uL (ref 1.4–7.7)
Neutrophils Relative %: 47.4 % (ref 43.0–77.0)
Platelets: 260 10*3/uL (ref 150.0–400.0)
RBC: 4.2 Mil/uL (ref 3.87–5.11)
RDW: 12.6 % (ref 11.5–15.5)
WBC: 5.5 10*3/uL (ref 4.0–10.5)

## 2024-01-08 LAB — VITAMIN D 25 HYDROXY (VIT D DEFICIENCY, FRACTURES): VITD: 37.97 ng/mL (ref 30.00–100.00)

## 2024-01-08 MED ORDER — VERAPAMIL HCL ER 180 MG PO TBCR
360.0000 mg | EXTENDED_RELEASE_TABLET | Freq: Every day | ORAL | 1 refills | Status: AC
Start: 2024-01-08 — End: ?

## 2024-01-08 MED ORDER — LOSARTAN POTASSIUM 25 MG PO TABS: ORAL_TABLET | ORAL | 1 refills | Status: DC

## 2024-01-08 NOTE — Assessment & Plan Note (Signed)
 Wrist splint at night--- pt never got one from ortho Pt to f/u with ortho due to con't pain Check xray c spine

## 2024-01-08 NOTE — Assessment & Plan Note (Signed)
 Encourage heart healthy diet such as MIND or DASH diet, increase exercise, avoid trans fats, simple carbohydrates and processed foods, consider a krill or fish or flaxseed oil cap daily.

## 2024-01-08 NOTE — Progress Notes (Signed)
 +  Established Patient Office Visit  Subjective   Patient ID: Megan Murphy, female    DOB: [birth year]  Age: 68 y.o. MRN: [id number]  Chief Complaint  Patient presents with   Hypertension   Follow-up    HPI Discussed the use of AI scribe software for clinical note transcription with the patient, who gave verbal consent to proceed.  History of Present Illness Megan Murphy is a 68 year old female who presents with left arm and shoulder pain.  She has been experiencing persistent pain in her left arm and shoulder for a couple of years. The pain sometimes extends from her hand up to her shoulder, with an ache in the shoulder area. She experiences numbness and tingling in her left hand, particularly at night, which sometimes wakes her up. The pain is not constant in her neck, but her shoulder gets sore occasionally.  In 2023, she underwent a nerve conduction study, which showed no nerve damage or carpal tunnel syndrome. Despite this, she continues to experience symptoms. She has been using diclofenac gel on her arm, which provides some pain relief. She also uses heating pads on her shoulder and has tried massages, including those from her husband, to alleviate the discomfort.  She recently refilled her prescriptions for verapamil  and losartan . She also takes vitamin D3 supplements, both over-the-counter and prescription, and is monitoring her vitamin D  levels.  No swelling in the ankle and her hand does not go numb during the day, only at night.   Patient Active Problem List   Diagnosis Date Noted   Need for tetanus booster 07/31/2022   Left upper quadrant abdominal pain 06/02/2021   Preventative health care 11/09/2020   Snoring 08/02/2020   Vitamin D  deficiency 08/02/2020   Stress at work 04/19/2020   Numbness and tingling in left hand 04/24/2019   Chest pain 04/24/2019   RUQ pain 11/08/2017   Dizziness and giddiness 05/17/2015   Essential hypertension 04/05/2010    ARTHRALGIA 04/05/2010   SKIN CANCER, HX OF 09/08/2008   HYPERLIPIDEMIA 07/29/2007   Past Medical History:  Diagnosis Date   Basal cell cancer     X 2;Dr Alanda Allegra   Complication of anesthesia    longer to come out of anesthesia   History of migraine headaches    Hyperlipidemia    Hypertension    Past Surgical History:  Procedure Laterality Date   BASAL CELL CARCINOMA EXCISION      X 2   CESAREAN SECTION      X 2; G 2 P 2   CHOLECYSTECTOMY N/A 01/09/2018   Procedure: LAPAROSCOPIC CHOLECYSTECTOMY;  Surgeon: Adalberto Acton, MD;  Location: [facility];  Service: General;  Laterality: N/A;   COLONOSCOPY  2012   Dr Grandville Lax, negative   dilation and curretage     Dr Medford Spike   EXCISION MASS UPPER EXTREMETIES Left 03/03/2022   Procedure: EXCISION OF LEFT SHOULDER SUBCUTANEOUS MASS;  Surgeon: Adalberto Acton, MD;  Location: [facility];  Service: General;  Laterality: Left;   WISDOM TOOTH EXTRACTION     Social History   Tobacco Use   Smoking status: Never   Smokeless tobacco: Never  Vaping Use   Vaping status: Never Used  Substance Use Topics   Alcohol use: Yes    Comment:  rarely   Drug use: No   Social History   Socioeconomic History   Marital status: Married    Spouse name: Not on file   Number of children: Not  on file   Years of education: Not on file   Highest education level: 12th grade  Occupational History   Occupation: Optometrist: [employer]    Comment: retired  Tobacco Use   Smoking status: Never   Smokeless tobacco: Never  Vaping Use   Vaping status: Never Used  Substance and Sexual Activity   Alcohol use: Yes    Comment:  rarely   Drug use: No   Sexual activity: Yes  Other Topics Concern   Not on file  Social History Narrative   Exercise-- walking some days    Social Drivers of Health   Financial Resource Strain: Low Risk  (01/03/2024)   Overall Financial Resource Strain (CARDIA)    Difficulty of Paying Living Expenses: Not hard at all   Food Insecurity: No Food Insecurity (01/03/2024)   Hunger Vital Sign    Worried About Running Out of Food in the Last Year: Never true    Ran Out of Food in the Last Year: Never true  Transportation Needs: No Transportation Needs (01/03/2024)   PRAPARE - Administrator, Civil Service (Medical): No    Lack of Transportation (Non-Medical): No  Physical Activity: Insufficiently Active (01/03/2024)   Exercise Vital Sign    Days of Exercise per Week: 1 day    Minutes of Exercise per Session: 30 min  Stress: No Stress Concern Present (01/03/2024)   [employer] of Occupational Health - Occupational Stress Questionnaire    Feeling of Stress : Not at all  Social Connections: Moderately Isolated (01/03/2024)   Social Connection and Isolation Panel [NHANES]    Frequency of Communication with Friends and Family: More than three times a week    Frequency of Social Gatherings with Friends and Family: Once a week    Attends Religious Services: Never    Database administrator or Organizations: No    Attends Engineer, structural: Not on file    Marital Status: Married  Catering manager Violence: Not At Risk (08/02/2022)   Humiliation, Afraid, Rape, and Kick questionnaire    Fear of Current or Ex-Partner: No    Emotionally Abused: No    Physically Abused: No    Sexually Abused: No   Family Status  Relation Name Status   Mother  (Not Specified)   Sister  (Not Specified)   Brother  (Not Specified)   MGM  (Not Specified)   Brother  Deceased at age 73       mi   Neg Hx  (Not Specified)  No partnership data on file   Family History  Problem Relation Age of Onset   Cancer Mother        ovarian cancer   Stroke Sister 73   Colon polyps Sister    Hypertension Sister    Stroke Brother 60   Hypertension Brother        X 3   Cancer Maternal Grandmother        bladder cancer   Heart attack Brother 48   Diabetes Neg Hx    Colon cancer Neg Hx    Esophageal cancer Neg Hx     Stomach cancer Neg Hx    Rectal cancer Neg Hx    Allergies  Allergen Reactions   Lisinopril     cough      Review of Systems  Constitutional:  Negative for chills, fever and malaise/fatigue.  HENT:  Negative for congestion and hearing loss.  Eyes:  Negative for blurred vision and discharge.  Respiratory:  Negative for cough, sputum production and shortness of breath.   Cardiovascular:  Negative for chest pain, palpitations and leg swelling.  Gastrointestinal:  Negative for abdominal pain, blood in stool, constipation, diarrhea, heartburn, nausea and vomiting.  Genitourinary:  Negative for dysuria, frequency, hematuria and urgency.  Musculoskeletal:  Negative for back pain, falls and myalgias.  Skin:  Negative for rash.  Neurological:  Negative for dizziness, sensory change, loss of consciousness, weakness and headaches.  Endo/Heme/Allergies:  Negative for environmental allergies. Does not bruise/bleed easily.  Psychiatric/Behavioral:  Negative for depression and suicidal ideas. The patient is not nervous/anxious and does not have insomnia.       Objective:     BP 112/80 (BP Location: Left Arm, Patient Position: Sitting, Cuff Size: Normal)   Pulse 73   Temp 97.8 F (36.6 C) (Oral)   Resp 16   Ht 5\' 3"  (1.6 m)   Wt 166 lb 12.8 oz (75.7 kg)   SpO2 98%   BMI 29.55 kg/m  BP Readings from Last 3 Encounters:  01/08/24 112/80  07/13/23 110/80  04/27/23 120/82   Wt Readings from Last 3 Encounters:  01/08/24 166 lb 12.8 oz (75.7 kg)  07/13/23 165 lb 9.6 oz (75.1 kg)  04/27/23 160 lb 3.2 oz (72.7 kg)   SpO2 Readings from Last 3 Encounters:  01/08/24 98%  07/13/23 97%  04/27/23 99%      Physical Exam Vitals and nursing note reviewed.  Constitutional:      General: She is not in acute distress.    Appearance: Normal appearance. She is well-developed.  HENT:     Head: Normocephalic and atraumatic.  Eyes:     General: No scleral icterus.       Right eye: No  discharge.        Left eye: No discharge.  Cardiovascular:     Rate and Rhythm: Normal rate and regular rhythm.     Heart sounds: No murmur heard. Pulmonary:     Effort: Pulmonary effort is normal. No respiratory distress.     Breath sounds: Normal breath sounds.  Musculoskeletal:        General: Normal range of motion.     Cervical back: Normal range of motion and neck supple.     Right lower leg: No edema.     Left lower leg: No edema.  Skin:    General: Skin is warm and dry.  Neurological:     Mental Status: She is alert and oriented to person, place, and time.     Sensory: No sensory deficit.     Comments: No pain or weakness now but pain in am after sleeping on L arm    Psychiatric:        Mood and Affect: Mood normal.        Behavior: Behavior normal.        Thought Content: Thought content normal.        Judgment: Judgment normal.      No results found for any visits on 01/08/24.  Last CBC Lab Results  Component Value Date   WBC 5.1 07/13/2023   HGB 14.3 07/13/2023   HCT 43.2 07/13/2023   MCV 97.9 07/13/2023   MCH 32.4 01/03/2018   RDW 12.6 07/13/2023   PLT 281.0 07/13/2023   Last metabolic panel Lab Results  Component Value Date   GLUCOSE 95 10/30/2023   NA 142 10/30/2023   K 3.8  10/30/2023   CL 105 10/30/2023   CO2 30 10/30/2023   BUN 13 10/30/2023   CREATININE 0.68 10/30/2023   GFR 89.93 10/30/2023   CALCIUM 9.1 10/30/2023   PROT 6.6 10/30/2023   ALBUMIN 3.9 10/30/2023   BILITOT 0.4 10/30/2023   ALKPHOS 87 10/30/2023   AST 13 10/30/2023   ALT 13 10/30/2023   ANIONGAP 6 02/27/2022   Last lipids Lab Results  Component Value Date   CHOL 255 (H) 10/30/2023   HDL 87.40 10/30/2023   LDLCALC 147 (H) 10/30/2023   LDLDIRECT 134.3 03/13/2012   TRIG 105.0 10/30/2023   CHOLHDL 3 10/30/2023   Last hemoglobin A1c Lab Results  Component Value Date   HGBA1C 5.5 03/07/2011   Last thyroid  functions Lab Results  Component Value Date   TSH 2.48  07/13/2023   Last vitamin D  Lab Results  Component Value Date   VD25OH 28.89 (L) 07/13/2023   Last vitamin B12 and Folate Lab Results  Component Value Date   VITAMINB12 283 07/13/2023   FOLATE 8.0 09/08/2008      The 10-year ASCVD risk score (Arnett DK, et al., 2019) is: 6.8%    Assessment & Plan:   Problem List Items Addressed This Visit       Unprioritized   Vitamin D  deficiency   Relevant Orders   VITAMIN D  25 Hydroxy (Vit-D Deficiency, Fractures)   Numbness and tingling in left hand   Wrist splint at night--- pt never got one from ortho Pt to f/u with ortho due to con't pain Check xray c spine       Relevant Orders   DG Cervical Spine Complete   Ambulatory referral to Orthopedic Surgery   HYPERLIPIDEMIA   Encourage heart healthy diet such as MIND or DASH diet, increase exercise, avoid trans fats, simple carbohydrates and processed foods, consider a krill or fish or flaxseed oil cap daily.        Relevant Medications   losartan  (COZAAR ) 25 MG tablet   verapamil  (CALAN -SR) 180 MG CR tablet   Essential hypertension   Well controlled, no changes to meds. Encouraged heart healthy diet such as the DASH diet and exercise as tolerated.        Relevant Medications   losartan  (COZAAR ) 25 MG tablet   verapamil  (CALAN -SR) 180 MG CR tablet   Other Visit Diagnoses       Hyperlipidemia, unspecified hyperlipidemia type    -  Primary   Relevant Medications   losartan  (COZAAR ) 25 MG tablet   verapamil  (CALAN -SR) 180 MG CR tablet   Other Relevant Orders   Comprehensive metabolic panel with GFR   Lipid panel     Primary hypertension       Relevant Medications   losartan  (COZAAR ) 25 MG tablet   verapamil  (CALAN -SR) 180 MG CR tablet   Other Relevant Orders   CBC with Differential/Platelet   Comprehensive metabolic panel with GFR   Lipid panel   VITAMIN D  25 Hydroxy (Vit-D Deficiency, Fractures)     Left arm pain       Relevant Orders   DG Cervical Spine Complete    Ambulatory referral to Orthopedic Surgery     Assessment and Plan Assessment & Plan Left arm and shoulder pain   Chronic pain in the left arm and shoulder is accompanied by intermittent numbness and tingling in the left hand. A previous nerve conduction study showed no nerve damage or carpal tunnel syndrome. Pain sometimes radiates to the shoulder with occasional  neck soreness, suggesting a possible pinched nerve in the neck or compressive neuropathy. Symptoms may be related to sleeping position or neck issues. Order a neck x-ray to evaluate for a pinched nerve. Provide a night splint to prevent nerve compression during sleep. Recommend deep tissue massage for shoulder pain relief. Discuss potential need for physical therapy or MRI if the x-ray shows disc space narrowing.  Numbness and tingling in left hand   Intermittent numbness and tingling in the left hand occur, particularly at night. A previous nerve conduction study ruled out carpal tunnel syndrome. Symptoms may be related to sleeping position or neck issues. Advise wearing a night splint to prevent wrist nerve compression. Evaluate x-ray results to determine if symptoms are related to neck issues.    Return in about 6 months (around 07/10/2024), or if symptoms worsen or fail to improve, for annual exam, fasting.    Denaisha Swango R Lowne Chase, DO

## 2024-01-08 NOTE — Assessment & Plan Note (Signed)
 Well controlled, no changes to meds. Encouraged heart healthy diet such as the DASH diet and exercise as tolerated.

## 2024-01-08 NOTE — Patient Instructions (Signed)

## 2024-01-09 ENCOUNTER — Ambulatory Visit (HOSPITAL_BASED_OUTPATIENT_CLINIC_OR_DEPARTMENT_OTHER)
Admission: RE | Admit: 2024-01-09 | Discharge: 2024-01-09 | Disposition: A | Discharge status: Home / Self Care | Source: Ambulatory Visit | Attending: Vascular Surgery | Admitting: Vascular Surgery

## 2024-01-09 DIAGNOSIS — I728 Aneurysm of other specified arteries: Secondary | ICD-10-CM | POA: Diagnosis present

## 2024-01-09 LAB — COMPREHENSIVE METABOLIC PANEL WITH GFR
ALT: 16 U/L (ref 0–35)
AST: 17 U/L (ref 0–37)
Albumin: 3.9 g/dL (ref 3.5–5.2)
Alkaline Phosphatase: 102 U/L (ref 39–117)
BUN: 18 mg/dL (ref 6–23)
CO2: 24 meq/L (ref 19–32)
Calcium: 8.9 mg/dL (ref 8.4–10.5)
Chloride: 106 meq/L (ref 96–112)
Creatinine, Ser: 0.7 mg/dL (ref 0.40–1.20)
GFR: 89.18 mL/min (ref >60.00)
Glucose, Bld: 91 mg/dL (ref 70–99)
Potassium: 4.3 meq/L (ref 3.5–5.1)
Sodium: 140 meq/L (ref 135–145)
Total Bilirubin: 0.4 mg/dL (ref 0.2–1.2)
Total Protein: 6.6 g/dL (ref 6.0–8.3)

## 2024-01-09 LAB — LIPID PANEL
Cholesterol: 284 mg/dL — ABNORMAL HIGH (ref 0–200)
HDL: 94.6 mg/dL (ref >39.00)
LDL Cholesterol: 171 mg/dL — ABNORMAL HIGH (ref 0–99)
NonHDL: 188.91
Total CHOL/HDL Ratio: 3
Triglycerides: 89 mg/dL (ref 0.0–149.0)
VLDL: 17.8 mg/dL (ref 0.0–40.0)

## 2024-01-09 MED ORDER — IOHEXOL 350 MG/ML SOLN
100.0000 mL | Freq: Once | INTRAVENOUS | Status: AC | PRN
  Administered 2024-01-09: 80 mL via INTRAVENOUS

## 2024-01-15 ENCOUNTER — Ambulatory Visit: Payer: Self-pay | Admitting: Family Medicine

## 2024-01-24 NOTE — Progress Notes (Unsigned)
 Patient ID: KARUNA BALDUCCI, female   DOB: 02-Sep-1955, 68 y.o.   MRN: [id number]  Reason for Consult: No chief complaint on file.   Referred by Estill Hemming, *  Subjective:     HPI CONSTANCE WHITTLE is a 68 y.o. female presenting for follow up of a 2.3cm splenic artery aneurysm. She was seen by Dr Shaunna Delaware a last year and this her is her 1 year follow up. She denies any abdominal pain or back pain. She is not a smoker.   Past Medical History:  Diagnosis Date   Basal cell cancer     X 2;Dr Alanda Allegra   Complication of anesthesia    longer to come out of anesthesia   History of migraine headaches    Hyperlipidemia    Hypertension    Family History  Problem Relation Age of Onset   Cancer Mother        ovarian cancer   Stroke Sister 29   Colon polyps Sister    Hypertension Sister    Stroke Brother 31   Hypertension Brother        X 3   Cancer Maternal Grandmother        bladder cancer   Heart attack Brother 65   Diabetes Neg Hx    Colon cancer Neg Hx    Esophageal cancer Neg Hx    Stomach cancer Neg Hx    Rectal cancer Neg Hx    Past Surgical History:  Procedure Laterality Date   BASAL CELL CARCINOMA EXCISION      X 2   CESAREAN SECTION      X 2; G 2 P 2   CHOLECYSTECTOMY N/A 01/09/2018   Procedure: LAPAROSCOPIC CHOLECYSTECTOMY;  Surgeon: Adalberto Acton, MD;  Location: [facility];  Service: General;  Laterality: N/A;   COLONOSCOPY  2012   Dr Grandville Lax, negative   dilation and curretage     Dr Medford Spike   EXCISION MASS UPPER EXTREMETIES Left 03/03/2022   Procedure: EXCISION OF LEFT SHOULDER SUBCUTANEOUS MASS;  Surgeon: Adalberto Acton, MD;  Location: [facility];  Service: General;  Laterality: Left;   WISDOM TOOTH EXTRACTION      Short Social History:  Social History   Tobacco Use   Smoking status: Never   Smokeless tobacco: Never  Substance Use Topics   Alcohol use: Yes    Comment:  rarely    Allergies  Allergen Reactions   Lisinopril     cough     Current Outpatient Medications  Medication Sig Dispense Refill   acetaminophen  (TYLENOL ) 500 MG tablet Take 1,000 mg by mouth every 8 (eight) hours as needed for headache or moderate pain.     Calcium Carb-Cholecalciferol (CALCIUM 500/VITAMIN D  PO) Take 1 tablet by mouth daily.     Cholecalciferol (VITAMIN D3) 50 MCG (2000 UT) capsule Take 2,000 Units by mouth daily.     clobetasol  ointment (TEMOVATE ) 0.05 % Apply 1 application topically daily as needed (lichen sclerosus). 30 g 1   COLLAGEN PO Take 1 Scoop by mouth daily. In powder form     losartan  (COZAAR ) 25 MG tablet TAKE ONE TABLET BY MOUTH ONE TIME DAILY 90 tablet 1   Omega-3 Fatty Acids (FISH OIL ULTRA) 1400 MG CAPS Take 1,400 mg by mouth daily.     verapamil  (CALAN -SR) 180 MG CR tablet Take 2 tablets (360 mg total) by mouth at bedtime. Pt needs office visit for further refills 180 tablet 1  Vitamin D , Ergocalciferol , (DRISDOL ) 1.25 MG (50000 UNIT) CAPS capsule TAKE ONCE CAPSULE BY MOUTH ONCE EVERY SEVEN DAYS 12 capsule 0   No current facility-administered medications for this visit.    REVIEW OF SYSTEMS  All other systems were reviewed and are negative     Objective:  Objective   There were no vitals filed for this visit. There is no height or weight on file to calculate BMI.  Physical Exam General: no acute distress Cardiac: hemodynamically stable Pulm: normal work of breathing Abdomen: non-tender, no pulsatile mass*** Neuro: alert, no focal deficit Extremities: no edema, cyanosis or wounds*** Vascular:   Right: Palpable femoral, DP  Left: Palpable femoral, DP  Data: CTA independently viewed Splenic artery aneurysm in the mid to distal segment artery measuring approximately 1.4 x 2.3 cm there is also another small aneurysm closer to the hilum     Assessment/Plan:   ETHAL GOTAY is a 68 y.o. female with with a splenic artery aneurysm.  She is asymptomatic and 63 cm which is stable from last year.  Likely  to be low risk of rupture as it is under 3cm, stable from last year and she is postmenopausal. Plan to continue with annual follow-up. Follow-up in 1 year with CTA  Philipp Brawn MD [facility]

## 2024-01-25 ENCOUNTER — Encounter: Payer: Self-pay | Admitting: Vascular Surgery

## 2024-01-25 ENCOUNTER — Ambulatory Visit: Attending: Vascular Surgery | Admitting: Vascular Surgery

## 2024-01-25 VITALS — BP 123/78 | HR 62 | Temp 97.9°F | Ht 63.0 in | Wt 167.7 lb

## 2024-01-25 DIAGNOSIS — I728 Aneurysm of other specified arteries: Secondary | ICD-10-CM | POA: Diagnosis not present

## 2024-04-30 LAB — HM MAMMOGRAPHY

## 2024-05-01 ENCOUNTER — Encounter: Payer: Self-pay | Admitting: Family Medicine

## 2024-07-10 ENCOUNTER — Ambulatory Visit (INDEPENDENT_AMBULATORY_CARE_PROVIDER_SITE_OTHER): Admitting: Family Medicine

## 2024-07-10 ENCOUNTER — Encounter: Payer: Self-pay | Admitting: Family Medicine

## 2024-07-10 ENCOUNTER — Ambulatory Visit: Payer: Self-pay | Admitting: Family Medicine

## 2024-07-10 VITALS — BP 118/78 | HR 68 | Temp 98.2°F | Resp 16 | Ht 63.0 in | Wt 171.4 lb

## 2024-07-10 DIAGNOSIS — E559 Vitamin D deficiency, unspecified: Secondary | ICD-10-CM | POA: Diagnosis not present

## 2024-07-10 DIAGNOSIS — R0602 Shortness of breath: Secondary | ICD-10-CM

## 2024-07-10 DIAGNOSIS — I10 Essential (primary) hypertension: Secondary | ICD-10-CM | POA: Diagnosis not present

## 2024-07-10 DIAGNOSIS — E782 Mixed hyperlipidemia: Secondary | ICD-10-CM

## 2024-07-10 DIAGNOSIS — Z8249 Family history of ischemic heart disease and other diseases of the circulatory system: Secondary | ICD-10-CM | POA: Diagnosis not present

## 2024-07-10 DIAGNOSIS — E785 Hyperlipidemia, unspecified: Secondary | ICD-10-CM

## 2024-07-10 LAB — COMPREHENSIVE METABOLIC PANEL WITH GFR
ALT: 17 U/L (ref 0–35)
AST: 17 U/L (ref 0–37)
Albumin: 3.8 g/dL (ref 3.5–5.2)
Alkaline Phosphatase: 98 U/L (ref 39–117)
BUN: 14 mg/dL (ref 6–23)
CO2: 31 meq/L (ref 19–32)
Calcium: 9 mg/dL (ref 8.4–10.5)
Chloride: 105 meq/L (ref 96–112)
Creatinine, Ser: 0.69 mg/dL (ref 0.40–1.20)
GFR: 89.18 mL/min (ref >60.00)
Glucose, Bld: 94 mg/dL (ref 70–99)
Potassium: 4 meq/L (ref 3.5–5.1)
Sodium: 142 meq/L (ref 135–145)
Total Bilirubin: 0.6 mg/dL (ref 0.2–1.2)
Total Protein: 6.5 g/dL (ref 6.0–8.3)

## 2024-07-10 LAB — LIPID PANEL
Cholesterol: 250 mg/dL — ABNORMAL HIGH (ref 0–200)
HDL: 87.8 mg/dL (ref >39.00)
LDL Cholesterol: 144 mg/dL — ABNORMAL HIGH (ref 0–99)
NonHDL: 162.68
Total CHOL/HDL Ratio: 3
Triglycerides: 93 mg/dL (ref 0.0–149.0)
VLDL: 18.6 mg/dL (ref 0.0–40.0)

## 2024-07-10 LAB — CBC WITH DIFFERENTIAL/PLATELET
Basophils Absolute: 0 K/uL (ref 0.0–0.1)
Basophils Relative: 0.3 % (ref 0.0–3.0)
Eosinophils Absolute: 0.1 K/uL (ref 0.0–0.7)
Eosinophils Relative: 2.6 % (ref 0.0–5.0)
HCT: 40.7 % (ref 36.0–46.0)
Hemoglobin: 13.7 g/dL (ref 12.0–15.0)
Lymphocytes Relative: 40.9 % (ref 12.0–46.0)
Lymphs Abs: 1.9 K/uL (ref 0.7–4.0)
MCHC: 33.7 g/dL (ref 30.0–36.0)
MCV: 94.5 fl (ref 78.0–100.0)
Monocytes Absolute: 0.4 K/uL (ref 0.1–1.0)
Monocytes Relative: 7.8 % (ref 3.0–12.0)
Neutro Abs: 2.3 K/uL (ref 1.4–7.7)
Neutrophils Relative %: 48.4 % (ref 43.0–77.0)
Platelets: 264 K/uL (ref 150.0–400.0)
RBC: 4.31 Mil/uL (ref 3.87–5.11)
RDW: 12.1 % (ref 11.5–15.5)
WBC: 4.7 K/uL (ref 4.0–10.5)

## 2024-07-10 LAB — VITAMIN D 25 HYDROXY (VIT D DEFICIENCY, FRACTURES): VITD: 33.34 ng/mL (ref 30.00–100.00)

## 2024-07-10 LAB — TSH: TSH: 2.16 u[IU]/mL (ref 0.35–5.50)

## 2024-07-10 NOTE — Assessment & Plan Note (Signed)
 Echo ordered  Refer to cardiology

## 2024-07-10 NOTE — Patient Instructions (Signed)

## 2024-07-10 NOTE — Assessment & Plan Note (Signed)
 Well controlled, no changes to meds. Encouraged heart healthy diet such as the DASH diet and exercise as tolerated.

## 2024-07-10 NOTE — Assessment & Plan Note (Signed)
 Refer to cardiology

## 2024-07-10 NOTE — Progress Notes (Signed)
 Subjective:    Patient ID: Megan Murphy, female    DOB: 11-18-1955, 69 y.o.   MRN: [id number]  Chief Complaint  Patient presents with   Hypertension   Hyperlipidemia    HPI Patient is in today for f/u bp and cholesterol   Discussed the use of AI scribe software for clinical note transcription with the patient, who gave verbal consent to proceed.  History of Present Illness Megan Murphy is a 68 year old female who presents for an annual physical exam.  She is scheduled for eye surgery on Monday for cataracts and glaucoma. The cataract in her right eye has significantly impaired her vision, making it difficult to see. She was referred to Dr. Grissom at [facility] by her eye doctor for glaucoma a couple of years ago.  She experiences shortness of breath, particularly noticeable when climbing stairs, which is a recent development. She has not seen a cardiologist before. There is a family history of heart disease, with her father passing away from a heart attack in his forties and a brother who also passed away from heart-related issues. Her mother died of ovarian cancer at the age of 28. She has a younger brother who is scheduled for a heart valve replacement and a sister with atrial fibrillation.  She has a history of high cholesterol and is currently taking fish oil capsules. She has been more physically active recently in hopes of improving her cholesterol levels.  She had a recent dermatological procedure where a nodule was removed from her skin, which was identified as a horn keratosis. Another lesion on her leg was described by the patient as feeling like a razor blade cutting her when touched by clothing.  She does not currently receive COVID-19 vaccinations. She reports good sleep quality and no significant issues with her stomach. However, she notes joint discomfort, stating 'things are  not what they used to be.'    Past Medical History:  Diagnosis Date   Basal cell cancer     X 2;Dr Helga   Complication of anesthesia    longer to come out of anesthesia   History of migraine headaches    Hyperlipidemia    Hypertension     Past Surgical History:  Procedure Laterality Date   BASAL CELL CARCINOMA EXCISION      X 2   CESAREAN SECTION      X 2; G 2 P 2   CHOLECYSTECTOMY N/A 01/09/2018   Procedure: LAPAROSCOPIC CHOLECYSTECTOMY;  Surgeon: Signe Mitzie LABOR, MD;  Location: [facility];  Service: General;  Laterality: N/A;   COLONOSCOPY  2012   Dr Obie, negative   dilation and curretage     Dr [place]   EXCISION MASS UPPER EXTREMETIES Left 03/03/2022   Procedure: EXCISION OF LEFT SHOULDER SUBCUTANEOUS MASS;  Surgeon: Signe Mitzie LABOR, MD;  Location: [facility];  Service: General;  Laterality: Left;   WISDOM TOOTH EXTRACTION      Family History  Problem Relation Age of Onset   Cancer Mother        ovarian cancer   Heart attack Father    Stroke Sister 90   Colon polyps Sister    Hypertension Sister    Stroke Brother  58   Hypertension Brother        X 3   Heart attack Brother 11   Cancer Maternal Grandmother        bladder cancer   Diabetes Neg Hx    Colon cancer Neg Hx    Esophageal cancer Neg Hx    Stomach cancer Neg Hx    Rectal cancer Neg Hx     Social History   Socioeconomic History   Marital status: Married    Spouse name: Not on file   Number of children: Not on file   Years of education: Not on file   Highest education level: 12th grade  Occupational History   Occupation: Optometrist: [employer]    Comment: retired  Tobacco Use   Smoking status: Never   Smokeless tobacco: Never  Vaping Use   Vaping status: Never Used  Substance and Sexual Activity   Alcohol use: Yes    Comment:  rarely   Drug use: No   Sexual activity: Yes  Other Topics Concern   Not on file  Social History Narrative   Exercise-- walking some days     Social Drivers of Health   Financial Resource Strain: Low Risk  (01/03/2024)   Overall Financial Resource Strain (CARDIA)    Difficulty of Paying Living Expenses: Not hard at all  Food Insecurity: No Food Insecurity (01/03/2024)   Hunger Vital Sign    Worried About Running Out of Food in the Last Year: Never true    Ran Out of Food in the Last Year: Never true  Transportation Needs: No Transportation Needs (01/03/2024)   PRAPARE - Administrator, Civil Service (Medical): No    Lack of Transportation (Non-Medical): No  Physical Activity: Insufficiently Active (01/03/2024)   Exercise Vital Sign    Days of Exercise per Week: 1 day    Minutes of Exercise per Session: 30 min  Stress: No Stress Concern Present (01/03/2024)   [employer] of Occupational Health - Occupational Stress Questionnaire    Feeling of Stress : Not at all  Social Connections: Moderately Isolated (01/03/2024)   Social Connection and Isolation Panel    Frequency of Communication with Friends and Family: More than three times a week    Frequency of Social Gatherings with Friends and Family: Once a week    Attends Religious Services: Never    Database Administrator or Organizations: No    Attends Engineer, Structural: Not on file    Marital Status: Married  Catering Manager Violence: Not At Risk (08/02/2022)   Humiliation, Afraid, Rape, and Kick questionnaire    Fear of Current or Ex-Partner: No    Emotionally Abused: No    Physically Abused: No    Sexually Abused: No    Outpatient Medications Prior to Visit  Medication Sig Dispense Refill   acetaminophen  (TYLENOL ) 500 MG tablet Take 1,000 mg by mouth every 8 (eight) hours as needed for headache or moderate pain.     Calcium Carb-Cholecalciferol (CALCIUM 500/VITAMIN D  PO) Take 1 tablet by mouth daily.     Cholecalciferol (VITAMIN D3) 50 MCG (2000 UT) capsule Take 2,000 Units by mouth daily.     clobetasol  ointment (TEMOVATE ) 0.05 % Apply  1 application topically daily as needed (lichen sclerosus). 30 g 1   COLLAGEN PO Take 1 Scoop by mouth daily. In powder form     losartan  (COZAAR ) 25 MG tablet TAKE ONE TABLET  BY MOUTH ONE TIME DAILY 90 tablet 1   Omega-3 Fatty Acids (FISH OIL ULTRA) 1400 MG CAPS Take 1,400 mg by mouth daily.     verapamil  (CALAN -SR) 180 MG CR tablet Take 2 tablets (360 mg total) by mouth at bedtime. Pt needs office visit for further refills 180 tablet 1   Vitamin D , Ergocalciferol , (DRISDOL ) 1.25 MG (50000 UNIT) CAPS capsule TAKE ONCE CAPSULE BY MOUTH ONCE EVERY SEVEN DAYS (Patient not taking: Reported on 07/10/2024) 12 capsule 0   No facility-administered medications prior to visit.    Allergies  Allergen Reactions   Lisinopril     cough    Review of Systems  Constitutional:  Negative for chills, fever and malaise/fatigue.  HENT:  Negative for congestion and hearing loss.   Eyes:  Negative for blurred vision and discharge.  Respiratory:  Negative for cough, sputum production and shortness of breath.   Cardiovascular:  Negative for chest pain, palpitations and leg swelling.  Gastrointestinal:  Negative for abdominal pain, blood in stool, constipation, diarrhea, heartburn, nausea and vomiting.  Genitourinary:  Negative for dysuria, frequency, hematuria and urgency.  Musculoskeletal:  Negative for back pain, falls and myalgias.  Skin:  Negative for rash.  Neurological:  Negative for dizziness, sensory change, loss of consciousness, weakness and headaches.  Endo/Heme/Allergies:  Negative for environmental allergies. Does not bruise/bleed easily.  Psychiatric/Behavioral:  Negative for depression and suicidal ideas. The patient is not nervous/anxious and does not have insomnia.        Objective:    Physical Exam Vitals and nursing note reviewed.  Constitutional:      General: She is not in acute distress.    Appearance: Normal appearance. She is well-developed.  HENT:     Head: Normocephalic and  atraumatic.     Right Ear: Tympanic membrane, ear canal and external ear normal. There is no impacted cerumen.     Left Ear: Tympanic membrane, ear canal and external ear normal. There is no impacted cerumen.     Nose: Nose normal.     Mouth/Throat:     Mouth: Mucous membranes are moist.     Pharynx: Oropharynx is clear. No oropharyngeal exudate or posterior oropharyngeal erythema.  Eyes:     General: No scleral icterus.       Right eye: No discharge.        Left eye: No discharge.     Conjunctiva/sclera: Conjunctivae normal.     Pupils: Pupils are equal, round, and reactive to light.  Neck:     Thyroid : No thyromegaly or thyroid  tenderness.     Vascular: No JVD.  Cardiovascular:     Rate and Rhythm: Normal rate and regular rhythm.     Heart sounds: Normal heart sounds. No murmur heard. Pulmonary:     Effort: Pulmonary effort is normal. No respiratory distress.     Breath sounds: Normal breath sounds.  Abdominal:     General: Bowel sounds are normal. There is no distension.     Palpations: Abdomen is soft. There is no mass.     Tenderness: There is no abdominal tenderness. There is no guarding or rebound.  Musculoskeletal:        General: Normal range of motion.     Cervical back: Normal range of motion and neck supple.     Right lower leg: No edema.     Left lower leg: No edema.  Lymphadenopathy:     Cervical: No cervical adenopathy.  Skin:    General:  Skin is warm and dry.     Findings: No erythema or rash.  Neurological:     Mental Status: She is alert and oriented to person, place, and time.     Cranial Nerves: No cranial nerve deficit.     Deep Tendon Reflexes: Reflexes are normal and symmetric.  Psychiatric:        Mood and Affect: Mood normal.        Behavior: Behavior normal.        Thought Content: Thought content normal.        Judgment: Judgment normal.     BP 118/78 (BP Location: Right Arm, Patient Position: Sitting, Cuff Size: Normal)   Pulse 68   Temp  98.2 F (36.8 C) (Oral)   Resp 16   Ht 5' 3 (1.6 m)   Wt 171 lb 6.4 oz (77.7 kg)   SpO2 95%   BMI 30.36 kg/m  Wt Readings from Last 3 Encounters:  07/10/24 171 lb 6.4 oz (77.7 kg)  01/25/24 167 lb 11.2 oz (76.1 kg)  01/08/24 166 lb 12.8 oz (75.7 kg)    Diabetic Foot Exam - Simple   No data filed    Lab Results  Component Value Date   WBC 5.5 01/08/2024   HGB 13.4 01/08/2024   HCT 40.2 01/08/2024   PLT 260.0 01/08/2024   GLUCOSE 91 01/08/2024   CHOL 284 (H) 01/08/2024   TRIG 89.0 01/08/2024   HDL 94.60 01/08/2024   LDLDIRECT 134.3 03/13/2012   LDLCALC 171 (H) 01/08/2024   ALT 16 01/08/2024   AST 17 01/08/2024   NA 140 01/08/2024   K 4.3 01/08/2024   CL 106 01/08/2024   CREATININE 0.70 01/08/2024   BUN 18 01/08/2024   CO2 24 01/08/2024   TSH 2.48 07/13/2023   HGBA1C 5.5 03/07/2011    Lab Results  Component Value Date   TSH 2.48 07/13/2023   Lab Results  Component Value Date   WBC 5.5 01/08/2024   HGB 13.4 01/08/2024   HCT 40.2 01/08/2024   MCV 95.6 01/08/2024   PLT 260.0 01/08/2024   Lab Results  Component Value Date   NA 140 01/08/2024   K 4.3 01/08/2024   CO2 24 01/08/2024   GLUCOSE 91 01/08/2024   BUN 18 01/08/2024   CREATININE 0.70 01/08/2024   BILITOT 0.4 01/08/2024   ALKPHOS 102 01/08/2024   AST 17 01/08/2024   ALT 16 01/08/2024   PROT 6.6 01/08/2024   ALBUMIN 3.9 01/08/2024   CALCIUM 8.9 01/08/2024   ANIONGAP 6 02/27/2022   GFR 89.18 01/08/2024   Lab Results  Component Value Date   CHOL 284 (H) 01/08/2024   Lab Results  Component Value Date   HDL 94.60 01/08/2024   Lab Results  Component Value Date   LDLCALC 171 (H) 01/08/2024   Lab Results  Component Value Date   TRIG 89.0 01/08/2024   Lab Results  Component Value Date   CHOLHDL 3 01/08/2024   Lab Results  Component Value Date   HGBA1C 5.5 03/07/2011       Assessment & Plan:  Primary hypertension -     Comprehensive metabolic panel with GFR -     Lipid  panel -     TSH  Family history of early CAD Assessment & Plan: Refer to cardiology  Orders: -     Ambulatory referral to Cardiology -     ECHOCARDIOGRAM COMPLETE; Future  Hyperlipidemia, unspecified hyperlipidemia type -  CBC with Differential/Platelet -     Comprehensive metabolic panel with GFR -     Lipid panel  Vitamin D  deficiency -     VITAMIN D  25 Hydroxy (Vit-D Deficiency, Fractures)  SOB (shortness of breath) on exertion Assessment & Plan: Echo ordered  Refer to cardiology  Orders: -     ECHOCARDIOGRAM COMPLETE; Future  Essential hypertension Assessment & Plan: Well controlled, no changes to meds. Encouraged heart healthy diet such as the DASH diet and exercise as tolerated.     HYPERLIPIDEMIA Assessment & Plan: Encourage heart healthy diet such as MIND or DASH diet, increase exercise, avoid trans fats, simple carbohydrates and processed foods, consider a krill or fish or flaxseed oil cap daily.     Assessment and Plan Assessment & Plan Adult Wellness Visit   Routine adult wellness visit with no significant changes in family history. Both parents passed away young; father from a heart attack and mother from ovarian cancer. Blood pressure and blood sugar levels are stable. Cholesterol levels are elevated, but she is more active and taking fish oil capsules. No need for COVID vaccinations. Continue current medications and lifestyle modifications. Encourage continued physical activity and a healthy diet.  Essential hypertension   Blood pressure is well-controlled with current medication. Continue current antihypertensive medications.  Hyperlipidemia   Cholesterol levels remain elevated. Increased activity and fish oil capsules may help improve lipid profile. Continue fish oil supplementation and encourage continued physical activity.  Vitamin D  deficiency   No specific discussion regarding vitamin D  levels or supplementation during this visit.  Shortness  of breath with exertion   Reports shortness of breath with exertion, especially when climbing stairs. Family history of heart disease, including father's early death from a heart attack and brother's upcoming valve replacement. No prior cardiology evaluation. Ordered referral to cardiology for evaluation, including EKG and possible echocardiogram.  Cataract and glaucoma, right eye   Scheduled for cataract and glaucoma surgery on Monday. Significant vision impairment in the right eye due to cataract. Post-surgery, she will likely need glasses for reading but should have improved distance vision. Proceed with scheduled cataract and glaucoma surgery.  Benign skin keratosis   Presence of benign skin keratosis, including a nodule on the face and a lesion on the leg. Lesions are benign and not concerning for malignancy.    Jalyn Dutta R Lowne Chase, DO

## 2024-07-10 NOTE — Assessment & Plan Note (Signed)
 Encourage heart healthy diet such as MIND or DASH diet, increase exercise, avoid trans fats, simple carbohydrates and processed foods, consider a krill or fish or flaxseed oil cap daily.

## 2024-07-15 ENCOUNTER — Encounter: Payer: Self-pay | Admitting: Vascular Surgery

## 2024-08-01 ENCOUNTER — Ambulatory Visit: Attending: Physician Assistant | Admitting: Physician Assistant

## 2024-08-01 VITALS — BP 130/80 | HR 71 | Ht 63.0 in | Wt 172.2 lb

## 2024-08-01 DIAGNOSIS — I728 Aneurysm of other specified arteries: Secondary | ICD-10-CM | POA: Diagnosis present

## 2024-08-01 DIAGNOSIS — R072 Precordial pain: Secondary | ICD-10-CM | POA: Diagnosis not present

## 2024-08-01 DIAGNOSIS — Z8249 Family history of ischemic heart disease and other diseases of the circulatory system: Secondary | ICD-10-CM

## 2024-08-01 MED ORDER — METOPROLOL TARTRATE 25 MG PO TABS: 25.0000 mg | ORAL_TABLET | Freq: Once | ORAL | 0 refills | Status: AC

## 2024-08-01 NOTE — Progress Notes (Signed)
 Cardiology Office Note   Date:  08/01/2024  ID:  Megan, Murphy 06-28-1956, MRN [id number] PCP: [name], [name], DO  [facility] Providers Cardiologist:  [facility]  History of Present Illness Megan Murphy is a 68 y.o. female with past medical history of splenic artery aneurysm followed by vascular surgery, basal cell cancer, hypertension and hyperlipidemia.  Echocardiogram obtained on 05/05/2019 showed EF 60 to 65%, normal RV.  She has a known 2.4 cm splenic artery aneurysm near the hilum, this has not changed in size over the years.  She is being followed by Dr. Eliza of vascular surgery for this.  According to Dr. Deliliah note, chance of rupture is very low.  Lipoprotein a in June 2024 was as low as 19.  Most recent CTA of the abdomen obtained on 01/09/2024 showed 2 separate splenic artery aneurysm, largest measuring 2.3 cm, second measuring 1.3 cm.  Recommend follow-up imaging in 1 year.  Patient was recently seen by PCP on 07/10/2024 for annual physical exam.  She has experienced shortness of breath when climbing stairs.  She has family history of heart disease from her father in her 30s and a brother who passed away with heart related issues at age 23.  Her PCP has ordered echocardiogram and refer her to cardiology service.  Patient presents today for [facility] evaluation of dyspnea on exertion and early family history of heart issue.  She has occasional chest pressure, however typically occurs at rest.  Recent blood work obtained by PCP showed normal CBC, CMP, TSH however uncontrolled cholesterol.  PCP has ordered a echocardiogram.  I also recommend a coronary CTA given her symptom which could be anginal equivalent.  If both echocardiogram and a coronary CTA are normal, she can follow-up as needed, otherwise I plan to set her up with Dr. Ren for follow-up in 3 months.  She does mention she feels like her heart is pounding after she climb to the top of the  stairs, however this could be related to deconditioning.  She recently underwent cataract surgery in one eye and is waiting for a cataract surgery in the other eye in the near future.  She has not done any strenuous activity recently.  ROS:   Patient complains of occasional chest pressure.  She has no lower extremity edema, orthopnea or PND.  Studies Reviewed      Cardiac Studies & Procedures   ______________________________________________________________________________________________     ECHOCARDIOGRAM  ECHOCARDIOGRAM COMPLETE 05/05/2019  Narrative ECHOCARDIOGRAM REPORT    Patient Name:   Megan Murphy Date of Exam: 05/05/2019 Medical Rec #:  [id number]         Height:       63.0 in Accession #:    [id number]        Weight:       174.8 lb Date of Birth:  [birth year]         BSA:          1.83 m Patient Age:    63 years          BP:           124/80 mmHg Patient Gender: F                 HR:           65 bpm. Exam Location:  [place] [facility]   Procedure: 2D Echo, Cardiac Doppler, Color Doppler and Strain Analysis  Indications:    R07.89 Other chest  pain;  History:        Patient has no prior history of Echocardiogram examinations. Signs/Symptoms: Chest Pain Risk Factors: Hypertension and Dyslipidemia.  Sonographer:    Ellouise Mose RDCS Referring Phys: [id number] JAMEE R LOWNE CHASE  IMPRESSIONS   1. The left ventricle has normal systolic function with an ejection fraction of 60-65%. The cavity size was normal. Left ventricular diastolic parameters were normal. 2. The right ventricle has normal systolic function. The cavity was normal. There is no increase in right ventricular wall thickness. 3. The aorta is normal unless otherwise noted.  FINDINGS Left Ventricle: The left ventricle has normal systolic function, with an ejection fraction of 60-65%. The cavity size was normal. There is no increase in left ventricular wall thickness. Left ventricular diastolic parameters were  normal.  Right Ventricle: The right ventricle has normal systolic function. The cavity was normal. There is no increase in right ventricular wall thickness.  Left Atrium: Left atrial size was normal in size.  Right Atrium: Right atrial size was normal in size.  Interatrial Septum: No atrial level shunt detected by color flow Doppler.  Pericardium: There is no evidence of pericardial effusion.  Mitral Valve: The mitral valve is normal in structure. Mitral valve regurgitation is trivial by color flow Doppler.  Tricuspid Valve: The tricuspid valve is normal in structure. Tricuspid valve regurgitation is trivial by color flow Doppler.  Aortic Valve: The aortic valve is normal in structure. Aortic valve regurgitation was not assessed by color flow Doppler.  Pulmonic Valve: The pulmonic valve was normal in structure. Pulmonic valve regurgitation was not assessed by color flow Doppler.  Aorta: The aorta is normal unless otherwise noted.  Venous: The inferior vena cava measures 1.74 cm, is normal in size with greater than 50% respiratory variability.   +--------------+--------++ LEFT VENTRICLE              +----------------+---------++ +--------------+--------++      Diastology                PLAX 2D                     +----------------+---------++ +--------------+--------++      LV e' lateral:  9.41 cm/s LVIDd:        4.30 cm       +----------------+---------++ +--------------+--------++      LV E/e' lateral:9.5       LVIDs:        2.52 cm       +----------------+---------++ +--------------+--------++      LV e' medial:   5.92 cm/s LV PW:        1.13 cm       +----------------+---------++ +--------------+--------++      LV E/e' medial: 15.1      LV IVS:       0.93 cm       +----------------+---------++ +--------------+--------++ LVOT diam:    1.80 cm  +--------------+--------++ LV SV:        60 ml     +--------------+--------++ LV SV Index:  31.64    +--------------+--------++ LVOT Area:    2.54 cm +--------------+--------++                        +--------------+--------++  +------------------+---------++ LV Volumes (MOD)            +------------------+---------++ LV area d, A2C:   27.00 cm +------------------+---------++ LV area d, A4C:   26.70 cm +------------------+---------++ LV area s,  A2C:   15.60 cm +------------------+---------++ LV area s, A4C:   15.80 cm +------------------+---------++ LV major d, A2C:  7.66 cm   +------------------+---------++ LV major d, A4C:  7.33 cm   +------------------+---------++ LV major s, A2C:  6.31 cm   +------------------+---------++ LV major s, A4C:  6.25 cm   +------------------+---------++ LV vol d, MOD A2C:78.6 ml   +------------------+---------++ LV vol d, MOD A4C:76.9 ml   +------------------+---------++ LV vol s, MOD A2C:32.8 ml   +------------------+---------++ LV vol s, MOD A4C:33.5 ml   +------------------+---------++ LV SV MOD A2C:    45.8 ml   +------------------+---------++ LV SV MOD A4C:    76.9 ml   +------------------+---------++ LV SV MOD BP:     46.2 ml   +------------------+---------++  +---------------+---------++ RIGHT VENTRICLE          +---------------+---------++ RV S prime:    9.97 cm/s +---------------+---------++ TAPSE (M-mode):2.0 cm    +---------------+---------++  +---------------+-------++-----------++ LEFT ATRIUM           Index       +---------------+-------++-----------++ LA diam:       2.80 cm1.53 cm/m  +---------------+-------++-----------++ LA Vol (A2C):  25.1 ml13.75 ml/m +---------------+-------++-----------++ LA Vol (A4C):  19.3 ml10.57 ml/m +---------------+-------++-----------++ LA Biplane Vol:23.9 ml13.09  ml/m +---------------+-------++-----------++ +------------+---------++-----------++ RIGHT ATRIUM         Index       +------------+---------++-----------++ RA Area:    10.20 cm            +------------+---------++-----------++ RA Volume:  19.10 ml 10.46 ml/m +------------+---------++-----------++ +------------+-----------++ AORTIC VALVE            +------------+-----------++ LVOT Vmax:  99.20 cm/s  +------------+-----------++ LVOT Vmean: 73.300 cm/s +------------+-----------++ LVOT VTI:   0.235 m     +------------+-----------++  +-------------+-------++ AORTA                +-------------+-------++ Ao Root diam:3.00 cm +-------------+-------++ Ao Asc diam: 2.90 cm +-------------+-------++  +--------------+----------++ MITRAL VALVE             +--------------+-------+ +--------------+----------++ SHUNTS                MV Area (PHT):4.15 cm   +--------------+-------+ +--------------+----------++ Systemic VTI: 0.24 m  MV PHT:       53.07 msec +--------------+-------+ +--------------+----------++ Systemic Diam:1.80 cm MV Decel Time:183 msec   +--------------+-------+ +--------------+----------++ +--------------+----------++ MV E velocity:89.50 cm/s +--------------+----------++ MV A velocity:86.10 cm/s +--------------+----------++ MV E/A ratio: 1.04       +--------------+----------++  +---------+-------+ IVC              +---------+-------+ IVC diam:1.74 cm +---------+-------+   Lamar Fitch MD Electronically signed by Lamar Fitch MD Signature Date/Time: 05/06/2019/12:19:23 PM    Final          ______________________________________________________________________________________________      Risk Assessment/Calculations           Physical Exam VS:  BP 130/80   Pulse 71   Ht 5' 3 (1.6 m)   Wt 172 lb 3.2 oz (78.1 kg)   SpO2 98%   BMI 30.50  kg/m        Wt Readings from Last 3 Encounters:  08/01/24 172 lb 3.2 oz (78.1 kg)  07/10/24 171 lb 6.4 oz (77.7 kg)  01/25/24 167 lb 11.2 oz (76.1 kg)    GEN: Well nourished, well developed in no acute distress NECK: No JVD; No carotid bruits CARDIAC: RRR, no murmurs, rubs, gallops RESPIRATORY:  Clear to auscultation without rales, wheezing or rhonchi  ABDOMEN: Soft, non-tender,  non-distended EXTREMITIES:  No edema; No deformity   ASSESSMENT AND PLAN  Chest pressure: Patient has family history of early CAD.  Given recent chest pressure, will obtain coronary CTA.  She will need single tablet of 25 mg metoprolol  prior to the coronary CTA  Splenic artery aneurysm: Followed by vascular surgery.       Dispo: If coronary CTA came back normal, she can follow-up as needed, otherwise she is to follow-up with Dr. Ren in 3 months.  Signed, Scot Ford, PA

## 2024-08-01 NOTE — Patient Instructions (Signed)
 Medication Instructions:  Your physician recommends that you continue on your current medications as directed. Please refer to the Current Medication list given to you today.  *If you need a refill on your cardiac medications before your next appointment, please call your pharmacy*  Lab Work: None ordered  If you have labs (blood work) drawn today and your tests are completely normal, you will receive your results only by: MyChart Message (if you have MyChart) OR A paper copy in the mail If you have any lab test that is abnormal or we need to change your treatment, we will call you to review the results.  Testing/Procedures: Your physician has requested that you have cardiac CT. Cardiac computed tomography (CT) is a painless test that uses an x-ray machine to take clear, detailed pictures of your heart. For further information please visit [email or web address] Please follow instruction sheet as BELOW:    Your cardiac CT will be scheduled at one of the below locations:   [facility] [address] [place], [place] [address] [phone] (Severe contrast allergies only)  OR   [facility] [address] [place], [place] [address] [phone]  OR   [facility] [address] [place], [place] [address] [phone]  OR   [facility] [address]  [place], [place] [address]  OR   [facility] [address] [place], [place] [phone]  If scheduled at [facility], please arrive at the [facility] and Children's Entrance (Entrance C2) of [facility] 30 minutes prior to test start time. You can use the FREE valet parking offered at entrance C (encouraged to control the heart rate for the test)  Proceed to the [facility] Radiology Department (first floor) to check-in and test prep.  All radiology patients and guests should use entrance C2 at [facility],  accessed from [facility], even though the hospital's physical address listed is [address].  If scheduled at the Heart and Vascular Tower at [address], please enter the parking lot using the [address] entrance and use the FREE valet service at the patient drop-off area. Enter the building and check-in with registration on the main floor.  If scheduled at [facility], please arrive to the Heart and Vascular Center 15 mins early for check-in and test prep.  There is spacious parking and easy access to the radiology department from the [facility] Heart and Vascular entrance. Please enter here and check-in with the desk attendant.   If scheduled at [facility], please arrive 30 minutes early for check-in and test prep.  Please follow these instructions carefully (unless otherwise directed):  An IV will be required for this test and Nitroglycerin will be given.  Hold all erectile dysfunction medications at least 3 days (72 hrs) prior to test. (Ie viagra, cialis, sildenafil, tadalafil, etc)   On the Night Before the Test: Be sure to Drink plenty of water. Do not consume any caffeinated/decaffeinated beverages or chocolate 12 hours prior to your test. Do not take any antihistamines 12 hours prior to your test.   On the Day of the Test: Drink plenty of water until 1 hour prior to the test. Do not eat any food 1 hour prior to test. You may take your regular medications prior to the test.  Take metoprolol (Lopressor) 25 MG two hours prior to test. THIS HAS BEEN SENT TO [facility]  THE DAY OF THE CARDIAC CT FEMALES- please wear underwire-free bra if available, avoid dresses & tight clothing   After the Test: Drink plenty of water. After receiving IV contrast, you may experience a mild flushed feeling. This is normal. On occasion, you may experience a mild rash up to 24 hours after the test. This is not dangerous. If this  occurs, you can take Benadryl 25 mg, Zyrtec, Claritin, or Allegra and increase your fluid intake. (Patients taking Tikosyn should avoid Benadryl, and may take Zyrtec, Claritin, or Allegra) If you experience trouble breathing, this can be serious. If it is severe call 911 IMMEDIATELY. If it is mild, please call our office.  We will call to schedule your test 2-4 weeks out understanding that some insurance companies will need an authorization prior to the service being performed.   For more information and frequently asked questions, please visit our website : [email or web address]  For non-scheduling related questions, please contact the cardiac imaging nurse navigator should you have any questions/concerns: Cardiac Imaging Nurse Navigators Direct Office Dial: [phone]   For scheduling needs, including cancellations and rescheduling, please call Brittany, [phone].   Follow-Up: At [facility], you and your health needs are our priority.  As part of our continuing mission to provide you with exceptional heart care, our providers are all part of one team.  This team includes your primary Cardiologist (physician) and Advanced Practice Providers or APPs (Physician Assistants and Nurse Practitioners) who all work together to provide you with the care you need, when you need it.  Your next appointment:   3 month(s)  Provider:   Joelle VEAR Ren Donley, MD    We recommend signing up for the patient portal called MyChart.  Sign up information is provided on this After Visit Summary.  MyChart is used to connect with patients for Virtual Visits (Telemedicine).  Patients are able to view lab/test results, encounter notes, upcoming appointments, etc.  Non-urgent messages can be sent to your provider as well.   To learn more about what you can do with MyChart, go to [email or web address].   Other Instructions

## 2024-08-24 ENCOUNTER — Other Ambulatory Visit: Payer: Self-pay | Admitting: Family Medicine

## 2024-08-24 DIAGNOSIS — I10 Essential (primary) hypertension: Secondary | ICD-10-CM

## 2024-08-27 ENCOUNTER — Encounter (HOSPITAL_COMMUNITY): Payer: Self-pay

## 2024-08-29 ENCOUNTER — Ambulatory Visit (HOSPITAL_COMMUNITY)
Admission: RE | Admit: 2024-08-29 | Discharge: 2024-08-29 | Disposition: A | Source: Ambulatory Visit | Attending: Cardiology | Admitting: Cardiology

## 2024-08-29 DIAGNOSIS — R072 Precordial pain: Secondary | ICD-10-CM | POA: Diagnosis not present

## 2024-08-29 MED ORDER — NITROGLYCERIN 0.4 MG SL SUBL
0.8000 mg | SUBLINGUAL_TABLET | Freq: Once | SUBLINGUAL | Status: AC
  Administered 2024-08-29: 0.8 mg via SUBLINGUAL

## 2024-08-29 MED ORDER — IOHEXOL 350 MG/ML SOLN
100.0000 mL | Freq: Once | INTRAVENOUS | Status: AC | PRN
  Administered 2024-08-29: 100 mL via INTRAVENOUS

## 2024-09-03 ENCOUNTER — Ambulatory Visit: Payer: Self-pay | Admitting: Physician Assistant

## 2024-09-03 DIAGNOSIS — R918 Other nonspecific abnormal finding of lung field: Secondary | ICD-10-CM

## 2024-09-04 NOTE — Progress Notes (Signed)
 A small 7 mm left and another small 7 mm right lung nodule, please order a CT of chest wo contrast in 3-6 month to reassess.

## 2024-09-07 ENCOUNTER — Encounter: Payer: Self-pay | Admitting: Family Medicine

## 2024-09-08 ENCOUNTER — Other Ambulatory Visit: Payer: Self-pay | Admitting: Family Medicine

## 2024-09-08 DIAGNOSIS — R911 Solitary pulmonary nodule: Secondary | ICD-10-CM

## 2024-09-09 NOTE — Telephone Encounter (Signed)
 Pt wanting to know if she should be concerned.

## 2024-09-15 ENCOUNTER — Ambulatory Visit (HOSPITAL_COMMUNITY)

## 2024-09-16 ENCOUNTER — Ambulatory Visit: Admitting: Pulmonary Disease

## 2024-09-16 ENCOUNTER — Encounter: Payer: Self-pay | Admitting: Pulmonary Disease

## 2024-09-16 VITALS — BP 142/80 | HR 71 | Ht 63.0 in | Wt 176.2 lb

## 2024-09-16 DIAGNOSIS — R918 Other nonspecific abnormal finding of lung field: Secondary | ICD-10-CM

## 2024-09-16 NOTE — Patient Instructions (Addendum)
 Your CT Coronary scan shows multiple small lung nodules that we will follow with CT Chest scans.  Follow up in first week of May to review the CT Chest scan   Please call in the meantime if your shortness of breath worsens, we can check pulmonary function tests

## 2024-09-16 NOTE — Progress Notes (Signed)
 "  New Patient Pulmonology Office Visit   Subjective:  Patient ID: Megan Murphy, female    DOB: 08/27/1955  MRN: [id number]  Referred by: Antonio Cyndee Jamee JONELLE, DO  CC:  Chief Complaint  Patient presents with   Consult    PT states 2023 / 4 mm nods- recent scan show they have grown     Discussed the use of AI scribe software for clinical note transcription with the patient, who gave verbal consent to proceed.  History of Present Illness Megan Murphy is a 69 year old female who presents with pulmonary nodules. She was referred by her primary care doctor for evaluation of pulmonary nodules found during a CT coronary scan.  Pulmonary nodules were found incidentally on a CT coronary scan on August 29, 2024, showing 7 mm nodules in both lung bases. She is concerned these may have grown compared with an abdominal scan in 2023 that showed a 4 mm left lower lobe nodule.  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 She also had possible asbestos exposure in 1986 when her husband ground glue off their floor and she cleaned the resulting dust.  Her family history is notable for ovarian cancer in her mother and bladder cancer in her grandmother. Her mother was a smoker and died at 70. She has no personal cancer history.  She has a mild nocturnal hacky cough when lying down that improves with water. She has exertional shortness of breath when climbing stairs, which she relates to recent decreased activity after eye surgeries. She has no sputum production and has not used inhalers. She has been less active but plans to resume exercise once cleared by ophthalmology.        Review of Systems  Constitutional:  Negative for chills, fever, malaise/fatigue and weight loss.  HENT:  Negative for congestion, sinus pain and sore throat.   Eyes: Negative.   Respiratory:   Positive for cough and shortness of breath (with climbing stairs). Negative for hemoptysis, sputum production and wheezing.   Cardiovascular:  Negative for chest pain, palpitations, orthopnea, claudication and leg swelling.  Gastrointestinal:  Negative for abdominal pain, heartburn, nausea and vomiting.  Genitourinary: Negative.   Musculoskeletal:  Negative for joint pain and myalgias.  Skin:  Negative for rash.  Neurological:  Negative for weakness.  Endo/Heme/Allergies: Negative.   Psychiatric/Behavioral: Negative.      Allergies: Lisinopril Current Medications[1] Past Medical History:  Diagnosis Date   Basal cell cancer     X 2;Dr Helga   Complication of anesthesia    longer to come out of anesthesia   History of migraine headaches    Hyperlipidemia    Hypertension    Past Surgical History:  Procedure Laterality Date   BASAL CELL CARCINOMA EXCISION      X 2   CESAREAN SECTION      X 2; G 2 P 2   CHOLECYSTECTOMY N/A 01/09/2018   Procedure: LAPAROSCOPIC CHOLECYSTECTOMY;  Surgeon: Signe Mitzie LABOR, MD;  Location: [facility];  Service: General;  Laterality: N/A;   COLONOSCOPY  2012   Dr Obie, negative   dilation and curretage     Dr Georgia   EXCISION MASS UPPER EXTREMETIES Left 03/03/2022   Procedure: EXCISION OF LEFT SHOULDER SUBCUTANEOUS MASS;  Surgeon: Signe Mitzie LABOR, MD;  Location: [facility];  Service: General;  Laterality: Left;  WISDOM TOOTH EXTRACTION     Family History  Problem Relation Age of Onset   Cancer Mother        ovarian cancer   Heart attack Father    Stroke Sister 69   Colon polyps Sister    Hypertension Sister    Stroke Brother 2   Hypertension Brother        X 3   Heart attack Brother 37   Cancer Maternal Grandmother        bladder cancer   Diabetes Neg Hx    Colon cancer Neg Hx    Esophageal cancer Neg Hx    Stomach cancer Neg Hx    Rectal cancer Neg Hx    Social History   Socioeconomic History   Marital status: Married    Spouse  name: Not on file   Number of children: Not on file   Years of education: Not on file   Highest education level: 12th grade  Occupational History   Occupation: Optometrist: [employer]    Comment: retired  Tobacco Use   Smoking status: Never   Smokeless tobacco: Never  Vaping Use   Vaping status: Never Used  Substance and Sexual Activity   Alcohol use: Yes    Comment:  rarely   Drug use: No   Sexual activity: Yes  Other Topics Concern   Not on file  Social History Narrative   Exercise-- walking some days    Social Drivers of Health   Tobacco Use: Low Risk (09/16/2024)   Patient History    Smoking Tobacco Use: Never    Smokeless Tobacco Use: Never    Passive Exposure: Not on file  Financial Resource Strain: Low Risk (01/03/2024)   Overall Financial Resource Strain (CARDIA)    Difficulty of Paying Living Expenses: Not hard at all  Food Insecurity: No Food Insecurity (01/03/2024)   Hunger Vital Sign    Worried About Running Out of Food in the Last Year: Never true    Ran Out of Food in the Last Year: Never true  Transportation Needs: No Transportation Needs (01/03/2024)   PRAPARE - Administrator, Civil Service (Medical): No    Lack of Transportation (Non-Medical): No  Physical Activity: Insufficiently Active (01/03/2024)   Exercise Vital Sign    Days of Exercise per Week: 1 day    Minutes of Exercise per Session: 30 min  Stress: No Stress Concern Present (01/03/2024)   [employer] of Occupational Health - Occupational Stress Questionnaire    Feeling of Stress : Not at all  Social Connections: Moderately Isolated (01/03/2024)   Social Connection and Isolation Panel    Frequency of Communication with Friends and Family: More than three times a week    Frequency of Social Gatherings with Friends and Family: Once a week    Attends Religious Services: Never    Database Administrator or Organizations: No    Attends Hospital Doctor: Not on file    Marital Status: Married  Catering Manager Violence: Not At Risk (08/02/2022)   Humiliation, Afraid, Rape, and Kick questionnaire    Fear of Current or Ex-Partner: No    Emotionally Abused: No    Physically Abused: No    Sexually Abused: No  Depression (PHQ2-9): Low Risk (01/08/2024)   Depression (PHQ2-9)    PHQ-2 Score: 0  Alcohol Screen: Low Risk (01/03/2024)   Alcohol Screen    Last Alcohol Screening  Score (AUDIT): 2  Housing: Low Risk (01/03/2024)   Housing Stability Vital Sign    Unable to Pay for Housing in the Last Year: No    Number of Times Moved in the Last Year: 0    Homeless in the Last Year: No  Utilities: Not At Risk (08/02/2022)   [facility] Utilities    Threatened with loss of utilities: No  Health Literacy: Not on file       Objective:  BP (!) 142/80 (BP Location: Left Arm, Cuff Size: Large)   Pulse 71   Ht 5' 3 (1.6 m) Comment: per pt  Wt 176 lb 3.2 oz (79.9 kg)   SpO2 98%   BMI 31.21 kg/m    Physical Exam Constitutional:      General: She is not in acute distress.    Appearance: Normal appearance.  Eyes:     General: No scleral icterus.    Conjunctiva/sclera: Conjunctivae normal.  Cardiovascular:     Rate and Rhythm: Normal rate and regular rhythm.  Pulmonary:     Breath sounds: No wheezing, rhonchi or rales.  Musculoskeletal:     Right lower leg: No edema.     Left lower leg: No edema.  Skin:    General: Skin is warm and dry.  Neurological:     General: No focal deficit present.     Diagnostic Review:  Last CBC Lab Results  Component Value Date   WBC 4.7 07/10/2024   HGB 13.7 07/10/2024   HCT 40.7 07/10/2024   MCV 94.5 07/10/2024   MCH 32.4 01/03/2018   RDW 12.1 07/10/2024   PLT 264.0 07/10/2024   Last metabolic panel Lab Results  Component Value Date   GLUCOSE 94 07/10/2024   NA 142 07/10/2024   K 4.0 07/10/2024   CL 105 07/10/2024   CO2 31 07/10/2024   BUN 14 07/10/2024   CREATININE 0.69 07/10/2024    GFR 89.18 07/10/2024   CALCIUM 9.0 07/10/2024   PROT 6.5 07/10/2024   ALBUMIN 3.8 07/10/2024   BILITOT 0.6 07/10/2024   ALKPHOS 98 07/10/2024   AST 17 07/10/2024   ALT 17 07/10/2024   ANIONGAP 6 02/27/2022    CT Coronary Scan 08/29/24 1. No acute extracardiac findings. 2. A 7 mm left and a 7 mm right lung base subpleural nodule. Non-contrast chest CT at 3-6 months is recommended. If the nodules are stable at time of repeat CT, then future CT at 18-24 months (from today's scan) is considered optional for low-risk patients, but is recommended for high-risk patients. This recommendation follows the consensus statement: Guidelines for Management of Incidental Pulmonary Nodules Detected on CT Images: From the Fleischner Society 2017; Radiology 2017; 284:228-243.     Assessment & Plan:   Assessment & Plan Pulmonary nodules      Assessment and Plan Assessment & Plan Pulmonary nodules 7 mm nodules in left and right lung bases increased from 4 mm. Low cancer risk due to past secondhand smoke exposure cessation over 35 years ago.  - Scheduled follow-up CT scan of the chest in early April. - Scheduled follow-up appointment one week after the CT scan to review results. - Consider PET scan or biopsy if nodules increase in size. - Monitor for any changes in symptoms or new symptoms.      Return in about 14 weeks (around 12/23/2024) for f/u visit Dr. Kara.   Dorn KATHEE Kara, MD     [1]  Current Outpatient Medications:    acetaminophen  (TYLENOL )  500 MG tablet, Take 1,000 mg by mouth every 8 (eight) hours as needed for headache or moderate pain., Disp: , Rfl:    Calcium Carb-Cholecalciferol (CALCIUM 500/VITAMIN D  PO), Take 1 tablet by mouth daily., Disp: , Rfl:    Cholecalciferol (VITAMIN D3) 50 MCG (2000 UT) capsule, Take 2,000 Units by mouth daily., Disp: , Rfl:    clobetasol  ointment (TEMOVATE ) 0.05 %, Apply 1 application topically daily as needed (lichen sclerosus)., Disp: 30  g, Rfl: 1   COLLAGEN PO, Take 1 Scoop by mouth daily. In powder form, Disp: , Rfl:    losartan  (COZAAR ) 25 MG tablet, TAKE ONE TABLET BY MOUTH ONE TIME DAILY, Disp: 90 tablet, Rfl: 1   metoprolol  tartrate (LOPRESSOR ) 25 MG tablet, Take 1 tablet (25 mg total) by mouth once. Take 90-120 minutes prior to scan. Hold for SBP less than 110., Disp: 1 tablet, Rfl: 0   Omega-3 Fatty Acids (FISH OIL ULTRA) 1400 MG CAPS, Take 1,400 mg by mouth daily., Disp: , Rfl:    verapamil  (CALAN -SR) 180 MG CR tablet, Take 2 tablets (360 mg total) by mouth at bedtime. Pt needs office visit for further refills, Disp: 180 tablet, Rfl: 1  "

## 2024-09-19 ENCOUNTER — Ambulatory Visit (HOSPITAL_COMMUNITY)
Admission: RE | Admit: 2024-09-19 | Discharge: 2024-09-19 | Disposition: A | Source: Ambulatory Visit | Attending: Family Medicine | Admitting: Family Medicine

## 2024-09-19 DIAGNOSIS — Z8249 Family history of ischemic heart disease and other diseases of the circulatory system: Secondary | ICD-10-CM | POA: Diagnosis present

## 2024-09-19 DIAGNOSIS — I10 Essential (primary) hypertension: Secondary | ICD-10-CM | POA: Insufficient documentation

## 2024-09-19 DIAGNOSIS — I3139 Other pericardial effusion (noninflammatory): Secondary | ICD-10-CM | POA: Insufficient documentation

## 2024-09-19 DIAGNOSIS — R0600 Dyspnea, unspecified: Secondary | ICD-10-CM | POA: Diagnosis not present

## 2024-09-19 DIAGNOSIS — R0602 Shortness of breath: Secondary | ICD-10-CM | POA: Insufficient documentation

## 2024-09-19 DIAGNOSIS — E785 Hyperlipidemia, unspecified: Secondary | ICD-10-CM | POA: Insufficient documentation

## 2024-09-19 LAB — ECHOCARDIOGRAM COMPLETE
Area-P 1/2: 3.76 cm2
Calc EF: 55.3 %
S' Lateral: 3 cm
Single Plane A2C EF: 55.6 %
Single Plane A4C EF: 55.8 %

## 2024-10-30 ENCOUNTER — Ambulatory Visit

## 2024-12-12 ENCOUNTER — Other Ambulatory Visit (HOSPITAL_COMMUNITY)

## 2024-12-23 ENCOUNTER — Ambulatory Visit: Admitting: Pulmonary Disease

## 2025-01-08 ENCOUNTER — Ambulatory Visit: Admitting: Family Medicine
